# Patient Record
Sex: Female | Born: 1995 | Race: Black or African American | Hispanic: No | Marital: Single | Smoking: Never smoker
Health system: Southern US, Community
[De-identification: ages and names within clinical notes are randomized; demographics above are authoritative.]

## PROBLEM LIST (undated history)

## (undated) DIAGNOSIS — J45909 Unspecified asthma, uncomplicated: Secondary | ICD-10-CM

## (undated) DIAGNOSIS — T7840XA Allergy, unspecified, initial encounter: Secondary | ICD-10-CM

## (undated) DIAGNOSIS — F32A Depression, unspecified: Secondary | ICD-10-CM

## (undated) DIAGNOSIS — D689 Coagulation defect, unspecified: Secondary | ICD-10-CM

## (undated) DIAGNOSIS — F419 Anxiety disorder, unspecified: Secondary | ICD-10-CM

## (undated) DIAGNOSIS — A749 Chlamydial infection, unspecified: Secondary | ICD-10-CM

## (undated) DIAGNOSIS — M810 Age-related osteoporosis without current pathological fracture: Secondary | ICD-10-CM

## (undated) DIAGNOSIS — A599 Trichomoniasis, unspecified: Secondary | ICD-10-CM

## (undated) DIAGNOSIS — A549 Gonococcal infection, unspecified: Secondary | ICD-10-CM

## (undated) HISTORY — PX: ROOT CANAL: SHX2363

---
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
## 2019-01-16 ENCOUNTER — Encounter (HOSPITAL_COMMUNITY): Payer: Self-pay

## 2019-01-16 ENCOUNTER — Ambulatory Visit (HOSPITAL_COMMUNITY)
Admission: EM | Admit: 2019-01-16 | Discharge: 2019-01-16 | Disposition: A | Discharge status: Home / Self Care | Payer: Medicaid Other | Attending: Internal Medicine | Admitting: Internal Medicine

## 2019-01-16 ENCOUNTER — Other Ambulatory Visit: Payer: Self-pay

## 2019-01-16 DIAGNOSIS — N760 Acute vaginitis: Secondary | ICD-10-CM

## 2019-01-16 LAB — POCT PREGNANCY, URINE: Preg Test, Ur: NEGATIVE

## 2019-01-16 MED ORDER — AZITHROMYCIN 250 MG PO TABS
ORAL_TABLET | ORAL | Status: AC
  Filled 2019-01-16: qty 4

## 2019-01-16 MED ORDER — AZITHROMYCIN 250 MG PO TABS
1000.0000 mg | ORAL_TABLET | Freq: Once | ORAL | Status: AC
  Administered 2019-01-16: 1000 mg via ORAL

## 2019-01-16 NOTE — ED Provider Notes (Signed)
[facility]    CSN: [id number] Arrival date & time: 01/16/19  1612     History   Chief Complaint Chief Complaint  Patient presents with  . SEXUALLY TRANSMITTED DISEASE    HPI Cassie Nguyen is a 23 y.o. female. She presents today after a female sexual partner let her know that she was positive for chlamydia.  Little bit of vag discharge, no bleeding.  Unsure if she could be pregnant.  No pelvic/abd pain.  No dysuria, no change in frequency.  No change in bowel habits.  No fever.  No malaise.    HPI  History reviewed. No pertinent past medical history.  Patient Active Problem List   Diagnosis Date Noted  . 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 No pertinent surgical history.     Home Medications    Takes no meds regularly  Family History Family History  Problem Relation Age of Onset  . Cancer Mother     Social History Social History   Tobacco Use  . Smoking status: Never Smoker  . Smokeless tobacco: Never Used  Substance Use Topics  . Alcohol use: No  . Drug use: No     Allergies   Lactase and Wheat   Review of Systems Review of Systems  All other systems reviewed and are negative.    Physical Exam Triage Vital Signs ED Triage Vitals  Enc Vitals Group     BP 01/16/19 1752 121/86     Pulse Rate 01/16/19 1752 85     Resp 01/16/19 1752 17     Temp 01/16/19 1752 99.2 F (37.3 C)     Temp Source 01/16/19 1752 Oral     SpO2 01/16/19 1752 100 %     Weight --      Height --      Pain Score 01/16/19 1753 0     Pain Loc --    Updated Vital Signs BP 121/86 (BP Location: Left Arm)   Pulse 85   Temp 99.2 F (37.3 C) (Oral)   Resp 17   LMP 01/06/2019 (Approximate)   SpO2 100%  Physical Exam Vitals signs and nursing note reviewed.  Constitutional:      General: She is not in acute distress. HENT:     Head: Atraumatic.  Eyes:     Comments: Conjugate gaze observed, no eye  redness/discharge  Neck:     Musculoskeletal: Neck supple.  Cardiovascular:     Rate and Rhythm: Normal rate.  Pulmonary:     Effort: No respiratory distress.  Abdominal:     General: There is no distension.  Musculoskeletal: Normal range of motion.  Skin:    General: Skin is warm and dry.  Neurological:     Mental Status: She is alert and oriented to person, place, and time.      UC Treatments / Results  Labs Results for orders placed or performed during the hospital encounter of 01/16/19  Pregnancy, urine POC  Result Value Ref Range   Preg Test, Ur NEGATIVE NEGATIVE    Procedures Procedures (including critical care time)  Medications Ordered in UC Medications  azithromycin (ZITHROMAX) tablet 1,000 mg (1,000 mg Oral Given 01/16/19 1902)    Final Clinical Impressions(s) / UC Diagnoses   Final diagnoses:  Vaginitis and vulvovaginitis     Discharge Instructions     Vaginal swab testing is pending today.  Injection of rocephin and oral dose of zithromax (antibiotics)  were given at urgent care today.  The urgent care will contact you if additional treatment is needed, based on results.  Please refrain from sexual intercourse for 7 days to give the medicine time to work.  Condoms may reduce risk of reinfection.          Isa Rankin, MD 01/17/19 1346

## 2019-01-16 NOTE — Discharge Instructions (Addendum)
Vaginal swab testing is pending today.  Oral dose of zithromax (antibiotic) was given at urgent care today.  The urgent care will contact you if additional treatment is needed, based on results.  Please refrain from sexual intercourse for 7 days to give the medicine time to work.  Condoms may reduce risk of reinfection.

## 2019-01-16 NOTE — ED Triage Notes (Signed)
Pt presents to [facility] for STD testing, pt states she has been exposed to chlamydia partner tested positive today.

## 2019-01-19 LAB — CERVICOVAGINAL ANCILLARY ONLY
Bacterial vaginitis: NEGATIVE
CHLAMYDIA, DNA PROBE: NEGATIVE
Candida vaginitis: NEGATIVE
NEISSERIA GONORRHEA: NEGATIVE
TRICH (WINDOWPATH): NEGATIVE

## 2019-01-21 ENCOUNTER — Telehealth (HOSPITAL_COMMUNITY): Payer: Self-pay

## 2019-04-29 ENCOUNTER — Encounter (HOSPITAL_COMMUNITY): Payer: Self-pay

## 2019-04-29 ENCOUNTER — Other Ambulatory Visit: Payer: Self-pay

## 2019-04-29 ENCOUNTER — Ambulatory Visit (HOSPITAL_COMMUNITY)
Admission: EM | Admit: 2019-04-29 | Discharge: 2019-04-29 | Disposition: A | Discharge status: Home / Self Care | Payer: Medicaid Other | Attending: Family Medicine | Admitting: Family Medicine

## 2019-04-29 DIAGNOSIS — J020 Streptococcal pharyngitis: Secondary | ICD-10-CM | POA: Diagnosis not present

## 2019-04-29 LAB — POCT RAPID STREP A: Streptococcus, Group A Screen (Direct): POSITIVE — AB

## 2019-04-29 MED ORDER — AMOXICILLIN 500 MG PO CAPS: 500.0000 mg | ORAL_CAPSULE | Freq: Two times a day (BID) | ORAL | 0 refills | Status: DC

## 2019-04-29 NOTE — ED Provider Notes (Signed)
[facility]    CSN: [id number] Arrival date & time: 04/29/19  1638     History   Chief Complaint Chief Complaint  Patient presents with  . Rash    HPI Cassie Nguyen is a 23 y.o. female.   23 year old female comes in for 4 day history right lymphadenopathy, 1 day history of bottom lip rash. Had sore throat prior to current symptom onset but has since resolved. Denies rhinorrhea, nasal congestion, cough. Denies fever, chills, night sweats. Denies body aches. Denies pain, itching, burning to the lower lip. Denies new hygiene product/food. Has not taken anything for the symptoms. No obvious sick contact.      History reviewed. No pertinent past medical history.  Patient Active Problem List   Diagnosis Date Noted  . 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 No pertinent surgical history.  OB History   No obstetric history on file.      Home Medications    Prior to Admission medications   Medication Sig Start Date End Date Taking? Authorizing Provider  amoxicillin (AMOXIL) 500 MG capsule Take 1 capsule (500 mg total) by mouth 2 (two) times daily. 04/29/19   Belinda FisherYu, Amy V, PA-C    Family History Family History  Problem Relation Age of Onset  . Cancer Mother     Social History Social History   Tobacco Use  . Smoking status: Never Smoker  . Smokeless tobacco: Never Used  Substance Use Topics  . Alcohol use: No  . Drug use: No     Allergies   Lactase and Wheat   Review of Systems Review of Systems  Reason unable to perform ROS: See HPI as above.     Physical Exam Triage Vital Signs ED Triage Vitals  Enc Vitals Group     BP 04/29/19 1657 116/73     Pulse Rate 04/29/19 1657 86     Resp 04/29/19 1657 16     Temp 04/29/19 1657 99.1 F (37.3 C)     Temp Source 04/29/19 1657 Oral     SpO2 04/29/19 1657 100 %     Weight 04/29/19 1655 130 lb (59 kg)     Height --      Head Circumference --       Peak Flow --      Pain Score 04/29/19 1655 8     Pain Loc --      Pain Edu? --      Excl. in GC? --    No data found.  Updated Vital Signs BP 116/73 (BP Location: Right Arm)   Pulse 86   Temp 99.1 F (37.3 C) (Oral)   Resp 16   Wt 130 lb (59 kg)   SpO2 100%   Physical Exam Constitutional:      General: She is not in acute distress.    Appearance: Normal appearance. She is not ill-appearing, toxic-appearing or diaphoretic.  HENT:     Head: Normocephalic and atraumatic.     Mouth/Throat:     Mouth: Mucous membranes are moist.     Pharynx: Oropharynx is clear. Uvula midline.     Tonsils: Tonsillar exudate present. 2+ on the right. 2+ on the left.  Neck:     Musculoskeletal: Normal range of motion and neck supple.  Cardiovascular:     Rate and Rhythm: Normal rate and regular rhythm.     Heart sounds: Normal heart sounds. No murmur. No friction  rub. No gallop.   Pulmonary:     Effort: Pulmonary effort is normal. No accessory muscle usage, prolonged expiration, respiratory distress or retractions.     Comments: Lungs clear to auscultation without adventitious lung sounds. Lymphadenopathy:     Head:     Right side of head: Submandibular and tonsillar adenopathy present.     Left side of head: Submandibular and tonsillar adenopathy present.  Neurological:     General: No focal deficit present.     Mental Status: She is alert and oriented to person, place, and time.    UC Treatments / Results  Labs (all labs ordered are listed, but only abnormal results are displayed) Labs Reviewed  POCT RAPID STREP A - Abnormal; Notable for the following components:      Result Value   Streptococcus, Group A Screen (Direct) POSITIVE (*)    All other components within normal limits    EKG None  Radiology No results found.  Procedures Procedures (including critical care time)  Medications Ordered in UC Medications - No data to display  Initial Impression / Assessment and  Plan / UC Course  I have reviewed the triage vital signs and the nursing notes.  Pertinent labs & imaging results that were available during my care of the patient were reviewed by me and considered in my medical decision making (see chart for details).    Rapid strep positive. Start antibiotic as directed. Symptomatic treatment as needed. Return precautions given.   Final Clinical Impressions(s) / UC Diagnoses   Final diagnoses:  Streptococcal sore throat   ED Prescriptions    Medication Sig Dispense Auth. Provider   amoxicillin (AMOXIL) 500 MG capsule Take 1 capsule (500 mg total) by mouth 2 (two) times daily. 20 capsule Threasa Alpha, [place] 04/29/19 [phone]

## 2019-04-29 NOTE — ED Triage Notes (Signed)
Pt states she has a rash on her bottom lip x 1day. Pt states she has swollen gland on the right side of her neck 4 days.

## 2019-04-29 NOTE — Discharge Instructions (Signed)
Rapid strep positive. Start amoxicillin as directed. Tylenol/Motrin for fever and pain. Monitor for any worsening of symptoms, trouble breathing, trouble swallowing, swelling of the throat, leaning forward to breath, drooling, follow up here or at the emergency department for reevaluation. ° °

## 2019-05-02 ENCOUNTER — Ambulatory Visit (HOSPITAL_COMMUNITY)
Admission: EM | Admit: 2019-05-02 | Discharge: 2019-05-02 | Disposition: A | Discharge status: Home / Self Care | Payer: Medicaid Other | Attending: Emergency Medicine | Admitting: Emergency Medicine

## 2019-05-02 ENCOUNTER — Encounter (HOSPITAL_COMMUNITY): Payer: Self-pay | Admitting: Emergency Medicine

## 2019-05-02 ENCOUNTER — Other Ambulatory Visit: Payer: Self-pay

## 2019-05-02 DIAGNOSIS — N760 Acute vaginitis: Secondary | ICD-10-CM | POA: Insufficient documentation

## 2019-05-02 MED ORDER — FLUCONAZOLE 150 MG PO TABS: ORAL_TABLET | ORAL | 0 refills | Status: DC

## 2019-05-02 NOTE — Discharge Instructions (Signed)
I am concerned about yeast contributing to your skin rash.  Please take the two pills as prescribed, may only require 1.  If still with mild symptoms may use over the counter Monistat cream topically as well.  Promptly change out of any moist undergarments.  Will notify you of any positive findings from your vaginal swab and if any changes to treatment are needed.

## 2019-05-02 NOTE — ED Triage Notes (Signed)
Says her vagina is really dry. Says some discharge but has gone away. Say there is some area on her clitoris is dry and feel different.

## 2019-05-03 NOTE — ED Provider Notes (Signed)
[facility]    CSN: [id number] Arrival date & time: 05/02/19  1657     History   Chief Complaint Chief Complaint  Patient presents with  . Exposure to STD    HPI Cassie Nguyen is a 23 y.o. female.   Cassie Nguyen presents with "dryness" to vulva which she has noticed for the past week. She notices dry skin and almost like sloughing of skin. Feels like she may have had some vaginal discharge at initial onset but denies any currently. No vaginal bleeding. No pain. No urinary symptoms. Denies  Any previous similar. She is currently on antibiotics for strep throat, states her symptoms started prior to these, however. Sexually active with female partner. No specific known std exposure. Without contributing medical history.      ROS per HPI, negative if not otherwise mentioned.      History reviewed. No pertinent past medical history.  Patient Active Problem List   Diagnosis Date Noted  . 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 No pertinent surgical history.  OB History   No obstetric history on file.      Home Medications    Prior to Admission medications   Medication Sig Start Date End Date Taking? Authorizing Provider  amoxicillin (AMOXIL) 500 MG capsule Take 1 capsule (500 mg total) by mouth 2 (two) times daily. 04/29/19   Cathie Hoops, Amy V, PA-C  fluconazole (DIFLUCAN) 150 MG tablet Take 1 tablet today. If still with symptoms may repeat in 3 days. 05/02/19   Georgetta Haber, NP    Family History Family History  Problem Relation Age of Onset  . Cancer Mother     Social History Social History   Tobacco Use  . Smoking status: Never Smoker  . Smokeless tobacco: Never Used  Substance Use Topics  . Alcohol use: No  . Drug use: No     Allergies   Lactase and Wheat   Review of Systems Review of Systems   Physical Exam Triage Vital Signs ED Triage Vitals  Enc Vitals Group     BP 05/02/19  1725 117/83     Pulse Rate 05/02/19 1725 88     Resp 05/02/19 1725 16     Temp 05/02/19 1725 98.2 F (36.8 C)     Temp Source 05/02/19 1725 Oral     SpO2 05/02/19 1725 100 %     Weight --      Height --      Head Circumference --      Peak Flow --      Pain Score 05/02/19 1726 0     Pain Loc --      Pain Edu? --      Excl. in GC? --    No data found.  Updated Vital Signs BP 117/83 (BP Location: Right Arm)   Pulse 88   Temp 98.2 F (36.8 C) (Oral)   Resp 16   LMP 04/25/2019   SpO2 100%    Physical Exam Constitutional:      General: She is not in acute distress.    Appearance: She is well-developed.  Cardiovascular:     Rate and Rhythm: Normal rate and regular rhythm.     Heart sounds: Normal heart sounds.  Pulmonary:     Effort: Pulmonary effort is normal.     Breath sounds: Normal breath sounds.  Genitourinary:      Comments: White flaking edge rash noted surrounding  vulva and into pubic mons, no redness, non tender; clear egg white vaginal discharge noted; vaginal swab collected, deferred bimanual exam or internal exam; no pelvic pain or tenderness; no sores or lesions  Skin:    General: Skin is warm and dry.  Neurological:     Mental Status: She is alert and oriented to person, place, and time.      UC Treatments / Results  Labs (all labs ordered are listed, but only abnormal results are displayed) Labs Reviewed  CERVICOVAGINAL ANCILLARY ONLY    EKG None  Radiology No results found.  Procedures Procedures (including critical care time)  Medications Ordered in UC Medications - No data to display  Initial Impression / Assessment and Plan / UC Course  I have reviewed the triage vital signs and the nursing notes.  Pertinent labs & imaging results that were available during my care of the patient were reviewed by me and considered in my medical decision making (see chart for details).     Dry flaky rash with defined edges to vulva, extending  from vagina. Recent antibiotics. Yeast likely with diflucan provided. May use topical monistat if needed. Vaginal swab collected and pending. Will notify of any positive findings and if any changes to treatment are needed.  If symptoms worsen or do not improve in the next week to return to be seen or to follow up with PCP.  Patient verbalized understanding and agreeable to plan.   Final Clinical Impressions(s) / UC Diagnoses   Final diagnoses:  Vulvovaginitis     Discharge Instructions     I am concerned about yeast contributing to your skin rash.  Please take the two pills as prescribed, may only require 1.  If still with mild symptoms may use over the counter Monistat cream topically as well.  Promptly change out of any moist undergarments.  Will notify you of any positive findings from your vaginal swab and if any changes to treatment are needed.     ED Prescriptions    Medication Sig Dispense Auth. Provider   fluconazole (DIFLUCAN) 150 MG tablet Take 1 tablet today. If still with symptoms may repeat in 3 days. 2 tablet Georgetta HaberBurky,  B, NP     Controlled Substance Prescriptions [place] Controlled Substance Registry consulted? Not Applicable   Georgetta HaberBurky,  B, NP 05/03/19 1013

## 2019-05-05 LAB — CERVICOVAGINAL ANCILLARY ONLY
Bacterial vaginitis: NEGATIVE
Candida vaginitis: NEGATIVE
Chlamydia: NEGATIVE
Neisseria Gonorrhea: NEGATIVE
Trichomonas: NEGATIVE

## 2019-06-04 ENCOUNTER — Ambulatory Visit (HOSPITAL_COMMUNITY)
Admission: EM | Admit: 2019-06-04 | Discharge: 2019-06-04 | Disposition: A | Discharge status: Home / Self Care | Payer: Medicaid Other | Attending: Family Medicine | Admitting: Family Medicine

## 2019-06-04 ENCOUNTER — Encounter (HOSPITAL_COMMUNITY): Payer: Self-pay

## 2019-06-04 DIAGNOSIS — N939 Abnormal uterine and vaginal bleeding, unspecified: Secondary | ICD-10-CM | POA: Diagnosis not present

## 2019-06-04 LAB — POCT PREGNANCY, URINE: Preg Test, Ur: NEGATIVE

## 2019-06-04 LAB — POCT URINALYSIS DIP (DEVICE)
Bilirubin Urine: NEGATIVE
Glucose, UA: NEGATIVE mg/dL
Ketones, ur: NEGATIVE mg/dL
Leukocytes,Ua: NEGATIVE
Nitrite: NEGATIVE
Protein, ur: NEGATIVE mg/dL
Specific Gravity, Urine: >=1.03 (ref 1.005–1.030)
Urobilinogen, UA: 0.2 mg/dL (ref 0.0–1.0)
pH: 5.5 (ref 5.0–8.0)

## 2019-06-04 MED ORDER — MEGESTROL ACETATE 40 MG PO TABS: 40.0000 mg | ORAL_TABLET | Freq: Two times a day (BID) | ORAL | 0 refills | Status: DC

## 2019-06-04 NOTE — Discharge Instructions (Addendum)
I will give you some medication to stop the bleeding. If this problem continues you need to follow-up with an OB GYN for further evaluation and management.  I will put a contact on your discharge instructions

## 2019-06-04 NOTE — ED Triage Notes (Signed)
Pt c/o heavy vaginal bleeding x4 days. States last cycle 2wks ago

## 2019-06-04 NOTE — ED Provider Notes (Signed)
[facility]    CSN: [id number] Arrival date & time: 06/04/19  1112     History   Chief Complaint Chief Complaint  Patient presents with  . Vaginal Bleeding    HPI Cassie Nguyen is a 23 y.o. female.   Patient is a 23 year old female presents today with irregular vaginal bleeding.  This has been present for 4 days.  She has had heavy bleeding with clots more in the morning and evening.  Reporting that she just got off her menstrual cycle approximately 2 weeks ago.  Her menstrual cycles are typically regular.  She does not take any birth control.  She denies any abdominal pain, back pain, fevers, dysuria, hematuria. No vaginal discharge, itching or irritation.  Denies any dizziness, weakness or shortness of breath.  No history of anemia.   ROS per HPI      History reviewed. No pertinent past medical history.  Patient Active Problem List   Diagnosis Date Noted  . 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 No pertinent surgical history.  OB History   No obstetric history on file.      Home Medications    Prior to Admission medications   Medication Sig Start Date End Date Taking? Authorizing Provider  megestrol (MEGACE) 40 MG tablet Take 1 tablet (40 mg total) by mouth 2 (two) times daily. 06/04/19   Janace ArisBast, Keiandra Sullenger A, NP    Family History Family History  Problem Relation Age of Onset  . Cancer Mother     Social History Social History   Tobacco Use  . Smoking status: Never Smoker  . Smokeless tobacco: Never Used  Substance Use Topics  . Alcohol use: No  . Drug use: No     Allergies   Lactase and Wheat   Review of Systems Review of Systems   Physical Exam Triage Vital Signs ED Triage Vitals  Enc Vitals Group     BP 06/04/19 1123 110/71     Pulse Rate 06/04/19 1123 90     Resp 06/04/19 1123 18     Temp 06/04/19 1123 98.5 F (36.9 C)     Temp Source 06/04/19 1123 Oral     SpO2 06/04/19  1123 100 %     Weight --      Height --      Head Circumference --      Peak Flow --      Pain Score 06/04/19 1125 0     Pain Loc --      Pain Edu? --      Excl. in GC? --    No data found.  Updated Vital Signs BP 110/71 (BP Location: Left Arm)   Pulse 90   Temp 98.5 F (36.9 C) (Oral)   Resp 18   LMP 05/31/2019   SpO2 100%   Visual Acuity Right Eye Distance:   Left Eye Distance:   Bilateral Distance:    Right Eye Near:   Left Eye Near:    Bilateral Near:     Physical Exam Vitals signs and nursing note reviewed.  Constitutional:      General: She is not in acute distress.    Appearance: Normal appearance. She is not ill-appearing, toxic-appearing or diaphoretic.  HENT:     Head: Normocephalic and atraumatic.     Nose: Nose normal.  Eyes:     Conjunctiva/sclera: Conjunctivae normal.  Neck:     Musculoskeletal: Normal range of motion.  Pulmonary:     Effort: Pulmonary effort is normal.  Abdominal:     Palpations: Abdomen is soft.     Tenderness: There is no abdominal tenderness.  Genitourinary:    Comments: External vaginal area without lesions.  Moderate amount of blood near vaginal opening.  Attempted to do an internal speculum exam but patient was unable to tolerate. Exam stopped Musculoskeletal: Normal range of motion.  Skin:    General: Skin is warm and dry.  Neurological:     Mental Status: She is alert.  Psychiatric:        Mood and Affect: Mood normal.       UC Treatments / Results  Labs (all labs ordered are listed, but only abnormal results are displayed) Labs Reviewed  POCT URINALYSIS DIP (DEVICE) - Abnormal; Notable for the following components:      Result Value   Hgb urine dipstick LARGE (*)    All other components within normal limits  POC URINE PREG, ED  POCT PREGNANCY, URINE    EKG None  Radiology No results found.  Procedures Procedures (including critical care time)  Medications Ordered in UC Medications - No data to  display  Initial Impression / Assessment and Plan / UC Course  I have reviewed the triage vital signs and the nursing notes.  Pertinent labs & imaging results that were available during my care of the patient were reviewed by me and considered in my medical decision making (see chart for details).     Urine negative for pregnancy or infection. Patient having abnormal uterine bleeding. This is most likely hormonal That really concern at this time for infection, STDs She has been bleeding heavy for 4 days.  We will have her do Megace and follow-up with OB/GYN  Final Clinical Impressions(s) / UC Diagnoses   Final diagnoses:  Abnormal uterine bleeding     Discharge Instructions     I will give you some medication to stop the bleeding. If this problem continues you need to follow-up with an OB GYN for further evaluation and management.  I will put a contact on your discharge instructions    ED Prescriptions    Medication Sig Dispense Auth. Provider   megestrol (MEGACE) 40 MG tablet Take 1 tablet (40 mg total) by mouth 2 (two) times daily. 20 tablet Dahlia Byes A, NP     Controlled Substance Prescriptions [place] Controlled Substance Registry consulted? Not Applicable   Janace Aris, NP 06/04/19 1341

## 2019-06-25 ENCOUNTER — Ambulatory Visit (HOSPITAL_COMMUNITY)
Admission: EM | Admit: 2019-06-25 | Discharge: 2019-06-25 | Disposition: A | Discharge status: Home / Self Care | Payer: Medicaid Other | Attending: Internal Medicine | Admitting: Internal Medicine

## 2019-06-25 ENCOUNTER — Other Ambulatory Visit: Payer: Self-pay

## 2019-06-25 ENCOUNTER — Encounter (HOSPITAL_COMMUNITY): Payer: Self-pay

## 2019-06-25 DIAGNOSIS — N921 Excessive and frequent menstruation with irregular cycle: Secondary | ICD-10-CM | POA: Diagnosis not present

## 2019-06-25 LAB — CBC
HCT: 35.6 % — ABNORMAL LOW (ref 36.0–46.0)
Hemoglobin: 12.1 g/dL (ref 12.0–15.0)
MCH: 29.2 pg (ref 26.0–34.0)
MCHC: 34 g/dL (ref 30.0–36.0)
MCV: 85.8 fL (ref 80.0–100.0)
Platelets: 377 10*3/uL (ref 150–400)
RBC: 4.15 MIL/uL (ref 3.87–5.11)
RDW: 13.6 % (ref 11.5–15.5)
WBC: 3.7 10*3/uL — ABNORMAL LOW (ref 4.0–10.5)
nRBC: 0 % (ref 0.0–0.2)

## 2019-06-25 LAB — TSH: TSH: 0.752 u[IU]/mL (ref 0.350–4.500)

## 2019-06-25 MED ORDER — MEDROXYPROGESTERONE ACETATE 10 MG PO TABS: 10.0000 mg | ORAL_TABLET | Freq: Every day | ORAL | 0 refills | Status: DC

## 2019-06-25 NOTE — ED Provider Notes (Signed)
[facility]    CSN: [id number] Arrival date & time: 06/25/19  1438      History   Chief Complaint Chief Complaint  Patient presents with  . Appointment    2:30  . Vaginal Bleeding    HPI Cassie Nguyen is a 23 y.o. female with a history of prolonged menstrual.  Comes to urgent care with complaints of persistent vaginal bleeding.  Patient was seen here on 06/04/2019.  She was prescribed Megace and advised to follow-up with a gynecologist.  Patient has not been able to see a gynecologist yet.  She uses about 6 tampons a day.  She admits to having some cramping in the lower abdomen.  No dizziness, near syncope or syncopal episodes.  HPI  History reviewed. No pertinent past medical history.  Patient Active Problem List   Diagnosis Date Noted  . 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 No pertinent surgical history.  OB History   No obstetric history on file.      Home Medications    Prior to Admission medications   Medication Sig Start Date End Date Taking? Authorizing Provider  megestrol (MEGACE) 40 MG tablet Take 1 tablet (40 mg total) by mouth 2 (two) times daily. 06/04/19  Yes Janace ArisBast, Traci A, NP    Family History Family History  Problem Relation Age of Onset  . Cancer Mother     Social History Social History   Tobacco Use  . Smoking status: Never Smoker  . Smokeless tobacco: Never Used  Substance Use Topics  . Alcohol use: No  . Drug use: No     Allergies   Lactase and Wheat   Review of Systems Review of Systems  Constitutional: Negative for activity change, appetite change and fatigue.  HENT: Negative.   Cardiovascular: Negative.   Gastrointestinal: Positive for abdominal pain.  Genitourinary: Positive for vaginal bleeding. Negative for dysuria, frequency, vaginal discharge and vaginal pain.  Musculoskeletal: Negative.      Physical Exam Triage Vital Signs ED Triage Vitals  Enc  Vitals Group     BP 06/25/19 1456 (!) 110/57     Pulse Rate 06/25/19 1456 84     Resp 06/25/19 1456 18     Temp 06/25/19 1456 98.6 F (37 C)     Temp Source 06/25/19 1456 Oral     SpO2 06/25/19 1456 100 %     Weight --      Height --      Head Circumference --      Peak Flow --      Pain Score 06/25/19 1453 0     Pain Loc --      Pain Edu? --      Excl. in GC? --    No data found.  Updated Vital Signs BP (!) 110/57 (BP Location: Right Arm)   Pulse 84   Temp 98.6 F (37 C) (Oral)   Resp 18   LMP 05/31/2019   SpO2 100%   Visual Acuity Right Eye Distance:   Left Eye Distance:   Bilateral Distance:    Right Eye Near:   Left Eye Near:    Bilateral Near:     Physical Exam Vitals signs and nursing note reviewed.  Constitutional:      General: She is not in acute distress.    Appearance: Normal appearance. She is not ill-appearing or toxic-appearing.  HENT:     Right Ear: Tympanic membrane normal.  Left Ear: Tympanic membrane normal.  Cardiovascular:     Rate and Rhythm: Normal rate and regular rhythm.     Pulses: Normal pulses.     Heart sounds: Normal heart sounds.  Pulmonary:     Effort: Pulmonary effort is normal. No respiratory distress.     Breath sounds: Normal breath sounds. No wheezing or rales.  Abdominal:     General: Bowel sounds are normal.     Palpations: Abdomen is soft.  Musculoskeletal: Normal range of motion.  Skin:    General: Skin is warm.     Capillary Refill: Capillary refill takes less than 2 seconds.  Neurological:     Mental Status: She is alert.      UC Treatments / Results  Labs (all labs ordered are listed, but only abnormal results are displayed) Labs Reviewed - No data to display  EKG None  Radiology No results found.  Procedures Procedures (including critical care time)  Medications Ordered in UC Medications - No data to display  Initial Impression / Assessment and Plan / UC Course  I have reviewed the triage  vital signs and the nursing notes.  Pertinent labs & imaging results that were available during my care of the patient were reviewed by me and considered in my medical decision making (see chart for details).     1. Metromenorrhagia: Provera 10 mg orally daily for 10 days Patient is strongly encouraged to find a gynecologist for evaluation CBC, TSH Patient has no signs of anemia.  She is hemodynamically stable.  Final Clinical Impressions(s) / UC Diagnoses   Final diagnoses:  None   Discharge Instructions   None    ED Prescriptions    None     Controlled Substance Prescriptions [place] Controlled Substance Registry consulted? No   Chase Picket, MD 06/25/19 [phone]

## 2019-06-25 NOTE — ED Triage Notes (Signed)
Patient presents to Urgent Care with complaints of continued vaginal bleeding since the beginning of the month. Patient reports she was seen at the beginning of the month for the same and was given a medication to help stop the bleeding, but that would only help for a few hours and the bleeding would restart. Pt denies fatigue.

## 2019-06-26 ENCOUNTER — Telehealth (HOSPITAL_COMMUNITY): Payer: Self-pay | Admitting: Emergency Medicine

## 2019-06-26 NOTE — Telephone Encounter (Signed)
Normal labs per Dr. Lanny Cramp, Patient contacted and made aware of all results, all questions answered. Pt has OBGYN follow up 07/20/2019

## 2019-07-20 ENCOUNTER — Other Ambulatory Visit: Payer: Self-pay

## 2019-07-20 ENCOUNTER — Ambulatory Visit (INDEPENDENT_AMBULATORY_CARE_PROVIDER_SITE_OTHER): Payer: Medicaid Other | Admitting: Obstetrics and Gynecology

## 2019-07-20 ENCOUNTER — Other Ambulatory Visit (HOSPITAL_COMMUNITY)
Admission: RE | Admit: 2019-07-20 | Discharge: 2019-07-20 | Disposition: A | Discharge status: Home / Self Care | Payer: Medicaid Other | Source: Ambulatory Visit | Attending: Obstetrics and Gynecology | Admitting: Obstetrics and Gynecology

## 2019-07-20 VITALS — BP 120/80 | Temp 98.1°F | Wt 123.6 lb

## 2019-07-20 DIAGNOSIS — N939 Abnormal uterine and vaginal bleeding, unspecified: Secondary | ICD-10-CM

## 2019-07-20 NOTE — Progress Notes (Signed)
Obstetrics and Gynecology Annual Patient Evaluation  Appointment Date: 07/20/2019  OBGYN Clinic: [facility]  Primary Care Provider: Patient, No Pcp Per  Referring Provider: Self  Chief Complaint:  Chief Complaint  Patient presents with  . Menstrual Problem    History of Present Illness: Cassie Nguyen is a 23 y.o. African-American P0 (No LMP recorded (lmp unknown).), seen for the above chief complaint.   Patient has long standing of irregular periods but have gotten worse in the past few months. She's gone to UC twice in June for this issue. She states she took the provera once but stopped after that. Bleeding recently got worse after same sex intercourse. She has never been on anything, aside from the one time provera dose, to help with her bleeding or for birth control.   No vaginal itching, abdominal pain, current bleeding, vaginal discharge. She denies any h/o migraines, HAs, VTEs  Review of Systems: as noted in the History of Present Illness.  Past Medical History:  No past medical history on file.  Past Surgical History:  No past surgical history on file.  Past Obstetrical History:  OB History  Gravida Para Term Preterm AB Living  0 0 0 0 0 0  SAB TAB Ectopic Multiple Live Births  0 0 0 0 0    Past Gynecological History: As per HPI. Menarche age 47 y/o History of Pap Smear(s): No. History of STI(s): No. She is currently using no method for contraception.   Social History:  Social History   Socioeconomic History  . Marital status: Single    Spouse name: Not on file  . Number of children: Not on file  . Years of education: Not on file  . Highest education level: Not on file  Occupational History  . Not on file  Social Needs  . Financial resource strain: Not on file  . Food insecurity    Worry: Not on file    Inability: Not on file  . Transportation needs    Medical: Not on file    Non-medical: Not on file  Tobacco Use  .  Smoking status: Never Smoker  . Smokeless tobacco: Never Used  Substance and Sexual Activity  . Alcohol use: No  . Drug use: No  . Sexual activity: Yes  Lifestyle  . Physical activity    Days per week: Not on file    Minutes per session: Not on file  . Stress: Not on file  Relationships  . Social Herbalist on phone: Not on file    Gets together: Not on file    Attends religious service: Not on file    Active member of club or organization: Not on file    Attends meetings of clubs or organizations: Not on file    Relationship status: Not on file  . Intimate partner violence    Fear of current or ex partner: Not on file    Emotionally abused: Not on file    Physically abused: Not on file    Forced sexual activity: Not on file  Other Topics Concern  . Not on file  Social History Narrative  . Not on file    Family History:  None   Medications Cassie Nguyen had no medications administered during this visit. Current Outpatient Medications  Medication Sig Dispense Refill  . medroxyPROGESTERone (PROVERA) 10 MG tablet Take 1 tablet (10 mg total) by mouth daily. (Patient not taking: Reported on 07/20/2019) 10 tablet 0  No current facility-administered medications for this visit.     Allergies Lactase and Wheat   Physical Exam:  BP 120/80   Temp 98.1 F (36.7 C)   Wt 123 lb 9.6 oz (56.1 kg)   LMP  (LMP Unknown) Comment: Approximately in May and lasting two months There is no height or weight on file to calculate BMI. General appearance: Well nourished, well developed female in no acute distress.  Neck:  Supple, normal appearance, and no thyromegaly  Cardiovascular: normal s1 and s2.  No murmurs, rubs or gallops. Respiratory:  Clear to auscultation bilateral. Normal respiratory effort Abdomen: positive bowel sounds and no masses, hernias; diffusely non tender to palpation, non distended Neuro/Psych:  Normal mood and affect.  Skin:  Warm and dry.   Lymphatic:  No inguinal lymphadenopathy.   Pelvic exam: is not limited by body habitus EGBUS: within normal limits, Vagina: within normal limits and with no blood or discharge in the vault, Cervix: normal appearing cervix without tenderness, discharge or lesions. Uterus:  Mobile, nonenlarged and non tender and Adnexa:  normal adnexa and no mass, fullness, tenderness Rectovaginal: deferred  Laboratory: UPT negative. UC TSH and CBC neg  Radiology: none  Assessment: pt stable  Plan:  1. 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 I told her I'd recommend some type of medical therapy (OCPs, patch, ring, depo provera, LNG IUD in that order) to help regulate her cycles and if that doesn't work then do an u/s and extensive work up. Pt to consider options  - Cervicovaginal ancillary only - Cytology - PAP  RTC PRN  Cassie Nguyen, Jr MD Attending [facility](Faculty Practice)

## 2019-07-21 ENCOUNTER — Encounter: Payer: Self-pay | Admitting: Obstetrics and Gynecology

## 2019-07-22 DIAGNOSIS — H52223 Regular astigmatism, bilateral: Secondary | ICD-10-CM | POA: Diagnosis not present

## 2019-07-22 LAB — CYTOLOGY - PAP

## 2019-07-23 LAB — CERVICOVAGINAL ANCILLARY ONLY
Bacterial vaginitis: POSITIVE — AB
Candida vaginitis: NEGATIVE
Chlamydia: NEGATIVE
Neisseria Gonorrhea: NEGATIVE
Trichomonas: NEGATIVE

## 2019-07-27 ENCOUNTER — Telehealth: Payer: Self-pay | Admitting: Obstetrics and Gynecology

## 2019-07-27 DIAGNOSIS — N879 Dysplasia of cervix uteri, unspecified: Secondary | ICD-10-CM | POA: Insufficient documentation

## 2019-07-27 MED ORDER — METRONIDAZOLE 500 MG PO TABS: 500.0000 mg | ORAL_TABLET | Freq: Two times a day (BID) | ORAL | 0 refills | Status: DC

## 2019-07-27 NOTE — Telephone Encounter (Signed)
GYN telephone note  Patient called at [phone] and generic VM picked up. VM left for her to call the clinic for her lab results.   Please inform patient she will need to go by pharmacy to pick up flagyl for her BV and she will need a colposcopy for her abnormal pap smear.   Durene Romans MD Attending Center for [facility] (Faculty Practice) 07/27/2019 Time: [phone]

## 2019-08-06 ENCOUNTER — Telehealth: Payer: Self-pay

## 2019-08-06 DIAGNOSIS — N879 Dysplasia of cervix uteri, unspecified: Secondary | ICD-10-CM

## 2019-08-06 NOTE — Telephone Encounter (Addendum)
-----   Message from Aletha Halim, MD sent at 08/05/2019  2:13 PM EDT ----- Please try and call her and let her know that she needs a colposcopy. If she doesn't answer for you, too, please send her a certified letter. Thank you  Pt notified of abnormal pap smear results.  I informed pt that the front office will contact her to schedule an appt with Dr. Ilda Basset for colposcopy.

## 2019-08-12 ENCOUNTER — Telehealth: Payer: Self-pay | Admitting: Obstetrics and Gynecology

## 2019-08-12 NOTE — Telephone Encounter (Signed)
Patient called in stating that someone had called her last week with her results. Patient wants to get her results again and wants to know what a colposcopy in tells.

## 2019-08-13 NOTE — Telephone Encounter (Signed)
I called Cassie Nguyen and she asked me to explain again what a pap smear is and what a colposcopy is. I explained what a pap smear is , what her pap results were  and that a colposcopy is recommended. I explained what a colposcopy is . She voices understanding. She also states that she is still having bleeding issues. I explained that when she saw Dr. Ilda Basset and they discussed this that he gave her options to consider  And that he recommended medical theraphy. She states she does not remember what her choices are. I reviewed what he recommended and informed her she can discuss at her colposcopy appointment. She voices understanding.

## 2019-09-14 ENCOUNTER — Ambulatory Visit: Payer: Medicaid Other | Admitting: Obstetrics and Gynecology

## 2019-10-02 ENCOUNTER — Ambulatory Visit: Payer: Medicaid Other | Admitting: Obstetrics and Gynecology

## 2019-10-02 ENCOUNTER — Encounter: Payer: Self-pay | Admitting: Obstetrics and Gynecology

## 2019-10-06 ENCOUNTER — Telehealth: Payer: Self-pay | Admitting: Obstetrics and Gynecology

## 2019-10-06 ENCOUNTER — Encounter: Payer: Self-pay | Admitting: Obstetrics and Gynecology

## 2019-10-06 NOTE — Progress Notes (Signed)
GYN Note Patient no show'ed to colposcopy appointment. Will have front desk call and reschedule; if no luck, then will have them send certified letter.  Durene Romans MD Attending Center for [facility] [employer] farm manager)

## 2019-10-06 NOTE — Telephone Encounter (Signed)
Attempted to call patient to get her rescheduled for her colpo. No answer, left voicemail for patient to give the office a call back. Certified letter mailed.

## 2019-11-06 ENCOUNTER — Encounter (HOSPITAL_COMMUNITY): Payer: Self-pay

## 2019-11-06 ENCOUNTER — Other Ambulatory Visit: Payer: Self-pay

## 2019-11-06 ENCOUNTER — Ambulatory Visit (HOSPITAL_COMMUNITY)
Admission: EM | Admit: 2019-11-06 | Discharge: 2019-11-06 | Disposition: A | Discharge status: Home / Self Care | Payer: Medicaid Other | Attending: Emergency Medicine | Admitting: Emergency Medicine

## 2019-11-06 DIAGNOSIS — M25531 Pain in right wrist: Secondary | ICD-10-CM | POA: Diagnosis not present

## 2019-11-06 NOTE — Discharge Instructions (Signed)
Take Tylenol or ibuprofen as needed for your pain.    Rest and elevate your wrist.  Apply ice packs 2-3 times a day for up to 20 minutes each.  Wear the wrist splint as needed for comfort.    Follow up with your primary care provider or an orthopedist if you symptoms continue or worsen;  Or if you develop new symptoms, such as numbness, tingling, or weakness.

## 2019-11-06 NOTE — ED Triage Notes (Signed)
Patient presents to Urgent Care with complaints of right wrist pain since sometime last week. 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

## 2019-11-06 NOTE — ED Provider Notes (Signed)
[facility]    CSN: [id number] Arrival date & time: 11/06/19  3299      History   Chief Complaint Chief Complaint  Patient presents with  . Appointment    8:50  . Wrist Pain    HPI Cassie Nguyen is a 23 y.o. female.   Patient presents with pain in her right wrist x1 week.  No specific injury or fall.  She performs repetitive motion using her wrist at work and believes this is the cause.  She denies numbness, weakness, tingling, swelling, lesions, rash, fever, or other symptoms.  No treatments attempted at home.  No other joint pains.  The history is provided by the patient.    History reviewed. No pertinent past medical history.  Patient Active Problem List   Diagnosis Date Noted  . Cervical dysplasia 07/27/2019  . Abnormal uterine bleeding (AUB) 07/20/2019  . 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 No pertinent surgical history.  OB History    Gravida  0   Para  0   Term  0   Preterm  0   AB  0   Living  0     SAB  0   TAB  0   Ectopic  0   Multiple  0   Live Births  0            Home Medications    Prior to Admission medications   Medication Sig Start Date End Date Taking? Authorizing Provider  medroxyPROGESTERone (PROVERA) 10 MG tablet Take 1 tablet (10 mg total) by mouth daily. Patient not taking: Reported on 07/20/2019 06/25/19   Merrilee Jansky, MD  metroNIDAZOLE (FLAGYL) 500 MG tablet Take 1 tablet (500 mg total) by mouth 2 (two) times daily. 07/27/19   [place] Bing, MD    Family History Family History  Problem Relation Age of Onset  . Cancer Mother     Social History Social History   Tobacco Use  . Smoking status: Never Smoker  . Smokeless tobacco: Never Used  Substance Use Topics  . Alcohol use: No  . Drug use: No     Allergies   Lactase, Shrimp [shellfish allergy], and Wheat   Review of Systems Review of Systems  Constitutional: Negative for  chills and fever.  HENT: Negative for ear pain and sore throat.   Eyes: Negative for pain and visual disturbance.  Respiratory: Negative for cough and shortness of breath.   Cardiovascular: Negative for chest pain and palpitations.  Gastrointestinal: Negative for abdominal pain and vomiting.  Genitourinary: Negative for dysuria and hematuria.  Musculoskeletal: Positive for arthralgias. Negative for back pain.  Skin: Negative for color change and rash.  Neurological: Negative for seizures, syncope, weakness and numbness.  All other systems reviewed and are negative.    Physical Exam Triage Vital Signs ED Triage Vitals  Enc Vitals Group     BP      Pulse      Resp      Temp      Temp src      SpO2      Weight      Height      Head Circumference      Peak Flow      Pain Score      Pain Loc      Pain Edu?      Excl. in GC?    No data found.  Updated Vital  Signs BP 107/71 (BP Location: Right Arm)   Pulse 70   Temp 98.7 F (37.1 C) (Oral)   Resp 16   SpO2 100%   Visual Acuity Right Eye Distance:   Left Eye Distance:   Bilateral Distance:    Right Eye Near:   Left Eye Near:    Bilateral Near:     Physical Exam Vitals signs and nursing note reviewed.  Constitutional:      General: She is not in acute distress.    Appearance: She is well-developed.  HENT:     Head: Normocephalic and atraumatic.     Mouth/Throat:     Mouth: Mucous membranes are moist.     Pharynx: Oropharynx is clear.  Eyes:     Conjunctiva/sclera: Conjunctivae normal.  Neck:     Musculoskeletal: Neck supple.  Cardiovascular:     Rate and Rhythm: Normal rate and regular rhythm.     Heart sounds: No murmur.  Pulmonary:     Effort: Pulmonary effort is normal. No respiratory distress.     Breath sounds: Normal breath sounds.  Abdominal:     General: Bowel sounds are normal.     Palpations: Abdomen is soft.     Tenderness: There is no abdominal tenderness. There is no guarding or rebound.   Musculoskeletal: Normal range of motion.        General: No swelling, tenderness, deformity or signs of injury.  Skin:    General: Skin is warm and dry.     Capillary Refill: Capillary refill takes less than 2 seconds.     Findings: No bruising, erythema, lesion or rash.  Neurological:     General: No focal deficit present.     Mental Status: She is alert and oriented to person, place, and time.     Sensory: No sensory deficit.     Motor: No weakness.      UC Treatments / Results  Labs (all labs ordered are listed, but only abnormal results are displayed) Labs Reviewed - No data to display  EKG   Radiology No results found.  Procedures Procedures (including critical care time)  Medications Ordered in UC Medications - No data to display  Initial Impression / Assessment and Plan / UC Course  I have reviewed the triage vital signs and the nursing notes.  Pertinent labs & imaging results that were available during my care of the patient were reviewed by me and considered in my medical decision making (see chart for details).   Wrist pain.  Patient is well-appearing and her exam is unremarkable.  Instructed her to take Tylenol or ibuprofen as needed for pain.  Instructed her to rest and elevate her wrist; to apply ice packs as directed; wear the wrist splint provided as needed for comfort.  Instructed her to follow-up with her PCP or an orthopedist if her symptoms continue or worsen or she develops new symptoms such as numbness, tingling, weakness.  Patient agrees to plan of care.  Final Clinical Impressions(s) / UC Diagnoses   Final diagnoses:  Right wrist pain     Discharge Instructions     Take Tylenol or ibuprofen as needed for your pain.    Rest and elevate your wrist.  Apply ice packs 2-3 times a day for up to 20 minutes each.  Wear the wrist splint as needed for comfort.    Follow up with your primary care provider or an orthopedist if you symptoms continue or  worsen;  Or if  you develop new symptoms, such as numbness, tingling, or weakness.         ED Prescriptions    None     PDMP not reviewed this encounter.   Mickie Bailate, Shawnya Mayor H, NP 11/06/19 [phone]

## 2019-11-23 ENCOUNTER — Ambulatory Visit: Payer: Medicaid Other | Admitting: Obstetrics and Gynecology

## 2019-12-16 ENCOUNTER — Ambulatory Visit: Payer: Medicaid Other | Admitting: Obstetrics and Gynecology

## 2019-12-23 ENCOUNTER — Encounter: Payer: Self-pay | Admitting: Obstetrics and Gynecology

## 2019-12-23 ENCOUNTER — Ambulatory Visit: Payer: Medicaid Other | Admitting: Obstetrics and Gynecology

## 2019-12-23 NOTE — Progress Notes (Signed)
Patient did not keep her colposcopy appointment for 12/23/2019.  Durene Romans MD Attending Center for [employer])

## 2020-01-08 ENCOUNTER — Ambulatory Visit: Payer: Medicaid Other | Attending: Internal Medicine

## 2020-01-08 DIAGNOSIS — Z20822 Contact with and (suspected) exposure to covid-19: Secondary | ICD-10-CM

## 2020-01-09 LAB — NOVEL CORONAVIRUS, NAA: SARS-CoV-2, NAA: NOT DETECTED

## 2020-02-26 ENCOUNTER — Ambulatory Visit (HOSPITAL_COMMUNITY)
Admission: EM | Admit: 2020-02-26 | Discharge: 2020-02-26 | Disposition: A | Discharge status: Home / Self Care | Payer: Medicaid Other | Attending: Urgent Care | Admitting: Urgent Care

## 2020-02-26 ENCOUNTER — Encounter (HOSPITAL_COMMUNITY): Payer: Self-pay

## 2020-02-26 ENCOUNTER — Other Ambulatory Visit: Payer: Self-pay

## 2020-02-26 DIAGNOSIS — Z20822 Contact with and (suspected) exposure to covid-19: Secondary | ICD-10-CM | POA: Diagnosis not present

## 2020-02-26 DIAGNOSIS — J029 Acute pharyngitis, unspecified: Secondary | ICD-10-CM | POA: Insufficient documentation

## 2020-02-26 DIAGNOSIS — Z91013 Allergy to seafood: Secondary | ICD-10-CM | POA: Insufficient documentation

## 2020-02-26 LAB — POCT RAPID STREP A: Streptococcus, Group A Screen (Direct): NEGATIVE

## 2020-02-26 MED ORDER — NAPROXEN 500 MG PO TABS: 500.0000 mg | ORAL_TABLET | Freq: Two times a day (BID) | ORAL | 0 refills | Status: DC

## 2020-02-26 MED ORDER — PSEUDOEPHEDRINE HCL 60 MG PO TABS: 60.0000 mg | ORAL_TABLET | Freq: Three times a day (TID) | ORAL | 0 refills | Status: DC | PRN

## 2020-02-26 MED ORDER — CETIRIZINE HCL 10 MG PO TABS: 10.0000 mg | ORAL_TABLET | Freq: Every day | ORAL | 0 refills | Status: DC

## 2020-02-26 NOTE — Discharge Instructions (Signed)
For sore throat or cough try using a honey-based tea. Use 3 teaspoons of honey with juice squeezed from half lemon. Place shaved pieces of ginger into 1/2-1 cup of water and warm over stove top. Then mix the ingredients and repeat every 4 hours as needed. Please take Tylenol 500mg every 6 hours. Hydrate very well with at least 2 liters of water. Eat light meals such as soups to replenish electrolytes and soft fruits, veggies. Start an antihistamine like Zyrtec (cetirizine) at 10mg daily for postnasal drainage, sinus congestion.  You can take this together with pseudoephedrine (Sudafed) at a dose of 60 mg 3 times a day or twice daily as needed for the same kind of congestion.   

## 2020-02-26 NOTE — ED Provider Notes (Signed)
[facility]   MRN: [id number] DOB: 1996-03-10  Subjective:   Cassie Nguyen is a 24 y.o. female presenting for acute onset this morning of recurrent throat pain and sinus congestion.  Patient states that she always gets drainage from her eyes as well.  This is not happening right now.  Has a history of strep throat.  Denies any known COVID-19 contacts.  Denies taking chronic medications.  Allergies  Allergen Reactions  . Lactase Other (See Comments)    Constipation, gi upset  . Peanut-Containing Drug Products Swelling  . Shrimp [Shellfish Allergy] Itching    Makes throat itchy and dry    History reviewed. No pertinent past medical history.   History reviewed. No pertinent surgical history.  Family History  Problem Relation Age of Onset  . Cancer Mother     Social History   Tobacco Use  . Smoking status: Never Smoker  . Smokeless tobacco: Never Used  Substance Use Topics  . Alcohol use: No  . Drug use: No    ROS   Objective:   Vitals: BP 109/70 (BP Location: Left Arm)   Pulse 90   Temp 98.7 F (37.1 C) (Oral)   Resp 16   LMP 02/17/2020   SpO2 100%   Physical Exam Constitutional:      General: She is not in acute distress.    Appearance: Normal appearance. She is well-developed. She is not ill-appearing, toxic-appearing or diaphoretic.  HENT:     Head: Normocephalic and atraumatic.     Nose: Congestion present. No rhinorrhea.     Mouth/Throat:     Mouth: Mucous membranes are moist. No oral lesions.     Pharynx: No pharyngeal swelling, oropharyngeal exudate, posterior oropharyngeal erythema or uvula swelling.     Tonsils: No tonsillar exudate or tonsillar abscesses.     Comments: Postnasal drainage overlying pharynx. Eyes:     Extraocular Movements: Extraocular movements intact.     Right eye: Normal extraocular motion.     Left eye: Normal extraocular motion.     Conjunctiva/sclera: Conjunctivae normal.     Pupils: Pupils are equal,  round, and reactive to light.  Cardiovascular:     Rate and Rhythm: Normal rate and regular rhythm.     Pulses: Normal pulses.     Heart sounds: Normal heart sounds. No murmur. No friction rub. No gallop.   Pulmonary:     Effort: Pulmonary effort is normal. No respiratory distress.     Breath sounds: Normal breath sounds. No stridor. No wheezing, rhonchi or rales.  Musculoskeletal:     Cervical back: Normal range of motion and neck supple.  Lymphadenopathy:     Cervical: No cervical adenopathy.  Skin:    General: Skin is warm and dry.     Findings: No rash.  Neurological:     General: No focal deficit present.     Mental Status: She is alert and oriented to person, place, and time.  Psychiatric:        Mood and Affect: Mood normal.        Behavior: Behavior normal.        Thought Content: Thought content normal.     Results for orders placed or performed during the hospital encounter of 02/26/20 (from the past 24 hour(s))  POCT rapid strep A [facility])     Status: None   Collection Time: 02/26/20  6:56 PM  Result Value Ref Range   Streptococcus, Group A Screen (Direct) NEGATIVE  NEGATIVE    Assessment and Plan :   1. Sore throat     Suspect that patient has persistent recurrent postnasal drainage and allergic rhinitis.  Recommended starting a daily regimen of Zyrtec, use pseudoephedrine as needed as a nasal decongestant.  For symptomatic relief will use naproxen. Strep culture and COVID 19 testing pending, counseled patient on nature of COVID-19 including modes of transmission, diagnostic testing, management and supportive care.  Offered symptomatic relief. COVID 19 testing is pending. Counseled patient on potential for adverse effects with medications prescribed/recommended today, ER and return-to-clinic precautions discussed, patient verbalized understanding.   Wallis Bamberg, [place] 02/26/20 1922

## 2020-02-26 NOTE — ED Triage Notes (Addendum)
Pt c/o sore throat and sinus congestion onset this morning. Denies cough, HA, body aches, abd pain, n/v/d, or fever/chills. Has h/o of strep throat.

## 2020-02-27 LAB — NOVEL CORONAVIRUS, NAA (HOSP ORDER, SEND-OUT TO REF LAB; TAT 18-24 HRS): SARS-CoV-2, NAA: NOT DETECTED

## 2020-02-28 LAB — CULTURE, GROUP A STREP (THRC)

## 2020-09-27 ENCOUNTER — Ambulatory Visit (HOSPITAL_COMMUNITY)
Admission: EM | Admit: 2020-09-27 | Discharge: 2020-09-27 | Disposition: A | Discharge status: Home / Self Care | Payer: Medicaid Other | Attending: Emergency Medicine | Admitting: Emergency Medicine

## 2020-09-27 ENCOUNTER — Encounter (HOSPITAL_COMMUNITY): Payer: Self-pay | Admitting: *Deleted

## 2020-09-27 ENCOUNTER — Other Ambulatory Visit: Payer: Self-pay

## 2020-09-27 ENCOUNTER — Ambulatory Visit (INDEPENDENT_AMBULATORY_CARE_PROVIDER_SITE_OTHER): Payer: Medicaid Other

## 2020-09-27 DIAGNOSIS — M25561 Pain in right knee: Secondary | ICD-10-CM | POA: Diagnosis not present

## 2020-09-27 DIAGNOSIS — Z20822 Contact with and (suspected) exposure to covid-19: Secondary | ICD-10-CM | POA: Insufficient documentation

## 2020-09-27 DIAGNOSIS — Z3202 Encounter for pregnancy test, result negative: Secondary | ICD-10-CM

## 2020-09-27 DIAGNOSIS — S8991XA Unspecified injury of right lower leg, initial encounter: Secondary | ICD-10-CM | POA: Diagnosis not present

## 2020-09-27 DIAGNOSIS — Z113 Encounter for screening for infections with a predominantly sexual mode of transmission: Secondary | ICD-10-CM | POA: Diagnosis not present

## 2020-09-27 HISTORY — DX: Unspecified asthma, uncomplicated: J45.909

## 2020-09-27 LAB — POC URINE PREG, ED: Preg Test, Ur: NEGATIVE

## 2020-09-27 MED ORDER — IBUPROFEN 800 MG PO TABS: 800.0000 mg | ORAL_TABLET | Freq: Three times a day (TID) | ORAL | 0 refills | Status: DC

## 2020-09-27 NOTE — ED Provider Notes (Signed)
[facility]    CSN: [id number] Arrival date & time: 09/27/20  1146      History   Chief Complaint Chief Complaint  Patient presents with  . Knee Injury  . Exposure to STD    HPI Cassie Nguyen is a 24 y.o. female history of asthma presenting today for evaluation of knee injury, STD screening and Covid screening.  Reports yesterday she fell at work, felt a popping sensation in her knee causing her to fall.  Reports pain deeper within the joint.  Occasional sensations of instability.  Has had prior partial rupture of patellar tendon in the same knee.  Denies radiation of pain.  Also requesting STD screening, recently had intercourse with prior partner.  Denies pelvic or vaginal symptoms.  Last menstrual cycle around 09/04/2020.  Requesting Covid screening.  Denies known exposure, denies symptoms.  HPI  Past Medical History:  Diagnosis Date  . Asthma     Patient Active Problem List   Diagnosis Date Noted  . Cervical dysplasia 07/27/2019  . Abnormal uterine bleeding (AUB) 07/20/2019  . 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 No pertinent surgical history.  OB History    Gravida  0   Para  0   Term  0   Preterm  0   AB  0   Living  0     SAB  0   TAB  0   Ectopic  0   Multiple  0   Live Births  0            Home Medications    Prior to Admission medications   Medication Sig Start Date End Date Taking? Authorizing Provider  cetirizine (ZYRTEC ALLERGY) 10 MG tablet Take 1 tablet (10 mg total) by mouth daily. 02/26/20   Wallis Bamberg, PA-C  ibuprofen (ADVIL) 800 MG tablet Take 1 tablet (800 mg total) by mouth 3 (three) times daily. 09/27/20   Minnette Merida C, PA-C  pseudoephedrine (SUDAFED) 60 MG tablet Take 1 tablet (60 mg total) by mouth every 8 (eight) hours as needed for congestion. 02/26/20   Wallis Bamberg, PA-C  medroxyPROGESTERone (PROVERA) 10 MG tablet Take 1 tablet (10 mg  total) by mouth daily. Patient not taking: Reported on 07/20/2019 06/25/19 02/26/20  Merrilee Jansky, MD    Family History Family History  Problem Relation Age of Onset  . Cancer Mother     Social History Social History   Tobacco Use  . Smoking status: Never Smoker  . Smokeless tobacco: Never Used  Vaping Use  . Vaping Use: Never used  Substance Use Topics  . Alcohol use: Yes    Comment: occ  . Drug use: Yes    Types: Marijuana    Comment: occ     Allergies   Lactase, Peanut-containing drug products, and Shrimp [shellfish allergy]   Review of Systems Review of Systems  Constitutional: Negative for fatigue and fever.  Eyes: Negative for visual disturbance.  Respiratory: Negative for shortness of breath.   Cardiovascular: Negative for chest pain.  Gastrointestinal: Negative for abdominal pain, diarrhea, nausea and vomiting.  Genitourinary: Negative for dysuria, flank pain, genital sores, hematuria, menstrual problem, vaginal bleeding, vaginal discharge and vaginal pain.  Musculoskeletal: Positive for arthralgias, gait problem and joint swelling. Negative for back pain.  Skin: Negative for color change, rash and wound.  Neurological: Negative for dizziness, weakness, light-headedness and headaches.     Physical Exam Triage Vital  Signs ED Triage Vitals  Enc Vitals Group     BP      Pulse      Resp      Temp      Temp src      SpO2      Weight      Height      Head Circumference      Peak Flow      Pain Score      Pain Loc      Pain Edu?      Excl. in GC?    No data found.  Updated Vital Signs BP 114/70 (BP Location: Left Arm)   Pulse (!) 114   Temp 98.2 F (36.8 C) (Oral)   Resp 18   LMP 09/04/2020   SpO2 100%   Visual Acuity Right Eye Distance:   Left Eye Distance:   Bilateral Distance:    Right Eye Near:   Left Eye Near:    Bilateral Near:     Physical Exam Vitals and nursing note reviewed.  Constitutional:      Appearance: She is  well-developed.     Comments: No acute distress  HENT:     Head: Normocephalic and atraumatic.     Nose: Nose normal.  Eyes:     Conjunctiva/sclera: Conjunctivae normal.  Cardiovascular:     Rate and Rhythm: Normal rate.  Pulmonary:     Effort: Pulmonary effort is normal. No respiratory distress.  Abdominal:     General: There is no distension.  Musculoskeletal:        General: Normal range of motion.     Cervical back: Neck supple.     Comments: Right knee: Mild swelling to suprapatellar area, no overlying discoloration erythema or warmth, nontender to palpation over patella, medial lateral joint line, nontender over patellar tendon, full flexion and extension without significant pain elicited, no popliteal tenderness No laxity appreciated with varus and valgus stress, negative Lachman's, negative anterior and posterior drawer  Skin:    General: Skin is warm and dry.  Neurological:     Mental Status: She is alert and oriented to person, place, and time.      UC Treatments / Results  Labs (all labs ordered are listed, but only abnormal results are displayed) Labs Reviewed  SARS CORONAVIRUS 2 (TAT 6-24 HRS)  POC URINE PREG, ED  CERVICOVAGINAL ANCILLARY ONLY    EKG   Radiology DG Knee Complete 4 Views Right  Result Date: 09/27/2020 CLINICAL DATA:  Right knee pain after injury at work yesterday. Popping sensation. EXAM: RIGHT KNEE - COMPLETE 4+ VIEW COMPARISON:  Radiograph 08/24/2016 FINDINGS: No evidence of fracture or dislocation. Chronic well corticated density inferior to the patellar pole likely sequela of remote prior injury. Normal joint spaces and alignment. There is a small knee joint effusion. IMPRESSION: Small knee joint effusion.  No fracture or dislocation. Electronically Signed   By: Narda Rutherford M.D.   On: 09/27/2020 14:56    Procedures Procedures (including critical care time)  Medications Ordered in UC Medications - No data to display  Initial  Impression / Assessment and Plan / UC Course  I have reviewed the triage vital signs and the nursing notes.  Pertinent labs & imaging results that were available during my care of the patient were reviewed by me and considered in my medical decision making (see chart for details).     1.  Right knee injury-effusion present, no acute bony abnormality, knee  feels stable.  Will treat as sprain and place an Ace wrap recommend anti-inflammatories ice and elevation.  Follow-up with sports medicine for further evaluation and imaging if symptoms continuing.  2.  STD screening-vaginal swab pending.  3.  Covid screening-Covid PCR pending  Discussed strict return precautions. Patient verbalized understanding and is agreeable with plan.  Final Clinical Impressions(s) / UC Diagnoses   Final diagnoses:  Injury of right knee, initial encounter  Screen for STD (sexually transmitted disease)  Encounter for laboratory testing for COVID-19 virus     Discharge Instructions     NO fracture Use anti-inflammatories for pain/swelling. You may take up to 800 mg Ibuprofen every 8 hours with food. You may supplement Ibuprofen with Tylenol [phone] mg every 8 hours.  Ice and elevate Follow up with sports medicine if pain continuing  STD and COVID screening pending     ED Prescriptions    Medication Sig Dispense Auth. Provider   ibuprofen (ADVIL) 800 MG tablet Take 1 tablet (800 mg total) by mouth 3 (three) times daily. 21 tablet Lorretta Kerce, [place] C, PA-C     PDMP not reviewed this encounter.   Jaunice Mirza, August C, PA-C 09/27/20 1520

## 2020-09-27 NOTE — ED Notes (Signed)
Ace wrap applied by med provider

## 2020-09-27 NOTE — Discharge Instructions (Addendum)
NO fracture Use anti-inflammatories for pain/swelling. You may take up to 800 mg Ibuprofen every 8 hours with food. You may supplement Ibuprofen with Tylenol [phone] mg every 8 hours.  Ice and elevate Follow up with sports medicine if pain continuing  STD and COVID screening pending

## 2020-09-27 NOTE — ED Triage Notes (Addendum)
Patient in with complaints of right knee pain after injury at work on yesterday.  Patient was playing with another person at work and fell back and felt something in right knee pop. Knee noted to be swollen. Patient able to walk and bend knee. Patient pain increases with going up stairs. Patient also requesting STD testing due to being with ex boyfriend.Patient noted that she has a bump on pubic area that she thinks may be an ingrown hair. Patient denies any vaginal symptoms.  Patient requesting COVID test, denies any symptoms currently.

## 2020-09-28 LAB — CERVICOVAGINAL ANCILLARY ONLY
Bacterial Vaginitis (gardnerella): POSITIVE — AB
Candida Glabrata: NEGATIVE
Candida Vaginitis: NEGATIVE
Chlamydia: NEGATIVE
Comment: NEGATIVE
Comment: NEGATIVE
Comment: NEGATIVE
Comment: NEGATIVE
Comment: NEGATIVE
Comment: NORMAL
Neisseria Gonorrhea: NEGATIVE
Trichomonas: NEGATIVE

## 2020-09-28 LAB — SARS CORONAVIRUS 2 (TAT 6-24 HRS): SARS Coronavirus 2: NEGATIVE

## 2020-09-29 ENCOUNTER — Telehealth (HOSPITAL_COMMUNITY): Payer: Self-pay | Admitting: Emergency Medicine

## 2020-09-29 MED ORDER — METRONIDAZOLE 500 MG PO TABS: 500.0000 mg | ORAL_TABLET | Freq: Two times a day (BID) | ORAL | 0 refills | Status: DC

## 2020-11-10 ENCOUNTER — Encounter (HOSPITAL_COMMUNITY): Payer: Self-pay

## 2020-11-10 ENCOUNTER — Ambulatory Visit (HOSPITAL_COMMUNITY)
Admission: EM | Admit: 2020-11-10 | Discharge: 2020-11-10 | Disposition: A | Discharge status: Home / Self Care | Payer: Medicaid Other | Attending: Family Medicine | Admitting: Family Medicine

## 2020-11-10 ENCOUNTER — Other Ambulatory Visit: Payer: Self-pay

## 2020-11-10 ENCOUNTER — Ambulatory Visit (HOSPITAL_COMMUNITY): Payer: Self-pay

## 2020-11-10 DIAGNOSIS — N898 Other specified noninflammatory disorders of vagina: Secondary | ICD-10-CM | POA: Diagnosis not present

## 2020-11-10 DIAGNOSIS — Z202 Contact with and (suspected) exposure to infections with a predominantly sexual mode of transmission: Secondary | ICD-10-CM | POA: Diagnosis not present

## 2020-11-10 NOTE — ED Provider Notes (Signed)
[facility]    CSN: [id number] Arrival date & time: 11/10/20  4098      History   Chief Complaint Chief Complaint  Patient presents with  . Exposure to STD    HPI Cassie Nguyen is a 24 y.o. female.   Presenting today requesting STI testing as one of her partners told her she had gonorrhea recently. She states she has 1 female and 2 female partners currently and had vaginal irritation after having relations with one of the female partners. Took monistat which helped some but did not resolve the issue. Denies rashes, fever, pelvic pain, abdominal pain, N/V/D. Does have some white discharge at times.      Past Medical History:  Diagnosis Date  . Asthma     Patient Active Problem List   Diagnosis Date Noted  . Cervical dysplasia 07/27/2019  . Abnormal uterine bleeding (AUB) 07/20/2019  . 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 No pertinent surgical history.  OB History    Gravida  0   Para  0   Term  0   Preterm  0   AB  0   Living  0     SAB  0   TAB  0   Ectopic  0   Multiple  0   Live Births  0            Home Medications    Prior to Admission medications   Medication Sig Start Date End Date Taking? Authorizing Provider  cetirizine (ZYRTEC ALLERGY) 10 MG tablet Take 1 tablet (10 mg total) by mouth daily. 02/26/20   Wallis Bamberg, PA-C  ibuprofen (ADVIL) 800 MG tablet Take 1 tablet (800 mg total) by mouth 3 (three) times daily. 09/27/20   Wieters, Hallie C, PA-C  metroNIDAZOLE (FLAGYL) 500 MG tablet Take 1 tablet (500 mg total) by mouth 2 (two) times daily. 09/29/20   LampteyBritta Mccreedy, MD  pseudoephedrine (SUDAFED) 60 MG tablet Take 1 tablet (60 mg total) by mouth every 8 (eight) hours as needed for congestion. 02/26/20   Wallis Bamberg, PA-C  medroxyPROGESTERone (PROVERA) 10 MG tablet Take 1 tablet (10 mg total) by mouth daily. Patient not taking: Reported on 07/20/2019 06/25/19  02/26/20  Merrilee Jansky, MD    Family History Family History  Problem Relation Age of Onset  . Cancer Mother     Social History Social History   Tobacco Use  . Smoking status: Never Smoker  . Smokeless tobacco: Never Used  Vaping Use  . Vaping Use: Never used  Substance Use Topics  . Alcohol use: Yes    Comment: occ  . Drug use: Yes    Types: Marijuana    Comment: occ     Allergies   Lactase, Peanut-containing drug products, and Shrimp [shellfish allergy]   Review of Systems Review of Systems PER HPI   Physical Exam Triage Vital Signs ED Triage Vitals  Enc Vitals Group     BP 11/10/20 0900 118/87     Pulse Rate 11/10/20 0900 75     Resp 11/10/20 0900 16     Temp 11/10/20 0900 98.4 F (36.9 C)     Temp Source 11/10/20 0900 Oral     SpO2 11/10/20 0900 99 %     Weight --      Height --      Head Circumference --      Peak Flow --  Pain Score 11/10/20 0859 0     Pain Loc --      Pain Edu? --      Excl. in GC? --    No data found.  Updated Vital Signs BP 118/87 (BP Location: Right Arm)   Pulse 75   Temp 98.4 F (36.9 C) (Oral)   Resp 16   LMP 11/06/2020 (Exact Date)   SpO2 99%   Visual Acuity Right Eye Distance:   Left Eye Distance:   Bilateral Distance:    Right Eye Near:   Left Eye Near:    Bilateral Near:     Physical Exam Vitals and nursing note reviewed.  Constitutional:      Appearance: Normal appearance. She is not ill-appearing.  HENT:     Head: Atraumatic.  Eyes:     Extraocular Movements: Extraocular movements intact.     Conjunctiva/sclera: Conjunctivae normal.  Cardiovascular:     Rate and Rhythm: Normal rate and regular rhythm.     Heart sounds: Normal heart sounds.  Pulmonary:     Effort: Pulmonary effort is normal.     Breath sounds: Normal breath sounds.  Abdominal:     General: Bowel sounds are normal. There is no distension.     Palpations: Abdomen is soft.     Tenderness: There is no abdominal  tenderness. There is no guarding.  Genitourinary:    Comments: GU exam deferred, self swab performed Musculoskeletal:        General: Normal range of motion.     Cervical back: Normal range of motion and neck supple.  Skin:    General: Skin is warm and dry.  Neurological:     Mental Status: She is alert and oriented to person, place, and time.  Psychiatric:        Mood and Affect: Mood normal.        Thought Content: Thought content normal.        Judgment: Judgment normal.     UC Treatments / Results  Labs (all labs ordered are listed, but only abnormal results are displayed) Labs Reviewed  CERVICOVAGINAL ANCILLARY ONLY    EKG   Radiology No results found.  Procedures Procedures (including critical care time)  Medications Ordered in UC Medications - No data to display  Initial Impression / Assessment and Plan / UC Course  I have reviewed the triage vital signs and the nursing notes.  Pertinent labs & imaging results that were available during my care of the patient were reviewed by me and considered in my medical decision making (see chart for details).     Aptima swab pending, discussed safe sexual practices, probiotics, good vaginal hygiene. Treat based on results of swab.  Final Clinical Impressions(s) / UC Diagnoses   Final diagnoses:  Vaginal itching  Possible exposure to STD   Discharge Instructions   None    ED Prescriptions    None     PDMP not reviewed this encounter.   Particia Nearing, [place] 11/10/20 [phone]

## 2020-11-10 NOTE — ED Triage Notes (Signed)
Pt presents for STD's testing. States she is worry as 1 sexual partner told her she has gonorrhea in the throat, and pt wants to know if is possible to have gonorrhea from intercourse female to female.  States she was having vaginal itchiness, vaginal discharge  After having sex with and ex-boyfriend got out of jail  and stopped 5 days ago when he rperiod started. .States she tried to have sex with her boyfriend 1 weeks ago and was painful. Pt reports she has 3 sexual partners (2 males, 1 female).  Pt reports she was prescribed metronidazole before, and she do not want to be treat it with this medication because is "nasty" and she wont take it.

## 2020-11-11 ENCOUNTER — Ambulatory Visit (HOSPITAL_COMMUNITY)
Admission: EM | Admit: 2020-11-11 | Discharge: 2020-11-11 | Disposition: A | Discharge status: Home / Self Care | Payer: Medicaid Other | Attending: Emergency Medicine | Admitting: Emergency Medicine

## 2020-11-11 ENCOUNTER — Other Ambulatory Visit: Payer: Self-pay

## 2020-11-11 DIAGNOSIS — A549 Gonococcal infection, unspecified: Secondary | ICD-10-CM | POA: Diagnosis not present

## 2020-11-11 LAB — CERVICOVAGINAL ANCILLARY ONLY
Bacterial Vaginitis (gardnerella): NEGATIVE
Candida Glabrata: NEGATIVE
Candida Vaginitis: NEGATIVE
Chlamydia: POSITIVE — AB
Comment: NEGATIVE
Comment: NEGATIVE
Comment: NEGATIVE
Comment: NEGATIVE
Comment: NEGATIVE
Comment: NORMAL
Neisseria Gonorrhea: POSITIVE — AB
Trichomonas: NEGATIVE

## 2020-11-11 MED ORDER — DOXYCYCLINE HYCLATE 100 MG PO TABS
ORAL_TABLET | ORAL | Status: AC
  Filled 2020-11-11: qty 1

## 2020-11-11 MED ORDER — DOXYCYCLINE HYCLATE 100 MG PO CAPS: 100.0000 mg | ORAL_CAPSULE | Freq: Two times a day (BID) | ORAL | 0 refills | Status: AC

## 2020-11-11 MED ORDER — CEFTRIAXONE SODIUM 500 MG IJ SOLR
INTRAMUSCULAR | Status: AC
  Filled 2020-11-11: qty 500

## 2020-11-11 MED ORDER — DOXYCYCLINE HYCLATE 100 MG PO TABS
100.0000 mg | ORAL_TABLET | Freq: Once | ORAL | Status: AC
  Administered 2020-11-11: 100 mg via ORAL

## 2020-11-11 MED ORDER — CEFTRIAXONE SODIUM 500 MG IJ SOLR
500.0000 mg | Freq: Once | INTRAMUSCULAR | Status: AC
  Administered 2020-11-11: 500 mg via INTRAMUSCULAR

## 2020-11-11 NOTE — ED Triage Notes (Signed)
Pt presents for treatment of gonorrhea & chlamydia per swab results and provider orders.  Pt treated with 500 mg rocephin IM and 7 days doxycycline

## 2020-11-11 NOTE — Discharge Instructions (Addendum)
Pt received shot of Rocephin in UC and 1st dose of doxycycline,scripted remainder.

## 2020-11-25 ENCOUNTER — Other Ambulatory Visit: Payer: Self-pay

## 2020-11-25 ENCOUNTER — Encounter (HOSPITAL_COMMUNITY): Payer: Self-pay

## 2020-11-25 ENCOUNTER — Ambulatory Visit (HOSPITAL_COMMUNITY)
Admission: EM | Admit: 2020-11-25 | Discharge: 2020-11-25 | Disposition: A | Discharge status: Home / Self Care | Payer: Medicaid Other | Attending: Family Medicine | Admitting: Family Medicine

## 2020-11-25 DIAGNOSIS — Z202 Contact with and (suspected) exposure to infections with a predominantly sexual mode of transmission: Secondary | ICD-10-CM | POA: Insufficient documentation

## 2020-11-25 NOTE — Discharge Instructions (Addendum)
Check your results on My Chart

## 2020-11-25 NOTE — ED Triage Notes (Signed)
Pt states she is symptomatic again since treatment on the 12th of November. Pt states she would like a pelvic exam as she thinks she may have a yeast infection. Pt is aox4 and ambulatory.

## 2020-11-25 NOTE — ED Provider Notes (Signed)
[facility]    CSN: [id number] Arrival date & time: 11/25/20  1751      History   Chief Complaint Chief Complaint  Patient presents with  . SEXUALLY TRANSMITTED DISEASE    recheck pt is symptomatic x 3 days    HPI Cassie Nguyen is a 24 y.o. female.   HPI Patient was seen 2 weeks ago for vaginal symptoms.  Her swab showed GC and chlamydia.  She took the shot and antibiotics as recommended.  She is now here for recurrence of symptoms.  She felt well for several days after her treatment, now her symptoms are come back.  She thinks she may have "a yeast infection".  Denies abdominal pain.  Fever chills.  Rash.  Lesions Past Medical History:  Diagnosis Date  . Asthma     Patient Active Problem List   Diagnosis Date Noted  . Cervical dysplasia 07/27/2019  . Abnormal uterine bleeding (AUB) 07/20/2019  . 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 No pertinent surgical history.  OB History    Gravida  0   Para  0   Term  0   Preterm  0   AB  0   Living  0     SAB  0   TAB  0   Ectopic  0   Multiple  0   Live Births  0            Home Medications    Prior to Admission medications   Medication Sig Start Date End Date Taking? Authorizing Provider  cetirizine (ZYRTEC ALLERGY) 10 MG tablet Take 1 tablet (10 mg total) by mouth daily. 02/26/20  Yes Wallis Bamberg, PA-C  ibuprofen (ADVIL) 800 MG tablet Take 1 tablet (800 mg total) by mouth 3 (three) times daily. 09/27/20  Yes Wieters, Hallie C, PA-C  metroNIDAZOLE (FLAGYL) 500 MG tablet Take 1 tablet (500 mg total) by mouth 2 (two) times daily. 09/29/20   LampteyBritta Mccreedy, MD  pseudoephedrine (SUDAFED) 60 MG tablet Take 1 tablet (60 mg total) by mouth every 8 (eight) hours as needed for congestion. 02/26/20   Wallis Bamberg, PA-C  medroxyPROGESTERone (PROVERA) 10 MG tablet Take 1 tablet (10 mg total) by mouth daily. Patient not taking: Reported on  07/20/2019 06/25/19 02/26/20  Merrilee Jansky, MD    Family History Family History  Problem Relation Age of Onset  . Cancer Mother     Social History Social History   Tobacco Use  . Smoking status: Never Smoker  . Smokeless tobacco: Never Used  Vaping Use  . Vaping Use: Never used  Substance Use Topics  . Alcohol use: Yes    Comment: occ  . Drug use: Yes    Types: Marijuana    Comment: occ     Allergies   Lactase, Peanut-containing drug products, and Shrimp [shellfish allergy]   Review of Systems Review of Systems See HPI  Physical Exam Triage Vital Signs ED Triage Vitals  Enc Vitals Group     BP 11/25/20 1803 120/69     Pulse Rate 11/25/20 1803 79     Resp 11/25/20 1803 17     Temp 11/25/20 1803 98.2 F (36.8 C)     Temp Source 11/25/20 1803 Oral     SpO2 11/25/20 1803 100 %     Weight --      Height --      Head Circumference --  Peak Flow --      Pain Score 11/25/20 1805 0     Pain Loc --      Pain Edu? --      Excl. in GC? --    No data found.  Updated Vital Signs BP 120/69 (BP Location: Right Arm)   Pulse 79   Temp 98.2 F (36.8 C) (Oral)   Resp 17   LMP 11/06/2020 (Exact Date)   SpO2 100%      Physical Exam Constitutional:      General: She is not in acute distress.    Appearance: She is well-developed.  HENT:     Head: Normocephalic and atraumatic.  Eyes:     Conjunctiva/sclera: Conjunctivae normal.     Pupils: Pupils are equal, round, and reactive to light.  Cardiovascular:     Rate and Rhythm: Normal rate.  Pulmonary:     Effort: Pulmonary effort is normal. No respiratory distress.  Abdominal:     Palpations: Abdomen is soft.  Genitourinary:    Comments: Proper technique for vaginal swab reviewed Musculoskeletal:        General: Normal range of motion.     Cervical back: Normal range of motion.  Skin:    General: Skin is warm and dry.  Neurological:     Mental Status: She is alert.  Psychiatric:        Behavior:  Behavior normal.      UC Treatments / Results  Labs (all labs ordered are listed, but only abnormal results are displayed) Labs Reviewed  CERVICOVAGINAL ANCILLARY ONLY    EKG   Radiology No results found.  Procedures Procedures (including critical care time)  Medications Ordered in UC Medications - No data to display  Initial Impression / Assessment and Plan / UC Course  I have reviewed the triage vital signs and the nursing notes.  Pertinent labs & imaging results that were available during my care of the patient were reviewed by me and considered in my medical decision making (see chart for details).     Safe sex recommended Final Clinical Impressions(s) / UC Diagnoses   Final diagnoses:  Possible exposure to STD     Discharge Instructions     Check your results on My Chart   ED Prescriptions    None     PDMP not reviewed this encounter.   Eustace Moore, MD 11/25/20 [phone]

## 2020-11-28 ENCOUNTER — Telehealth (HOSPITAL_COMMUNITY): Payer: Self-pay | Admitting: Emergency Medicine

## 2020-11-28 LAB — CERVICOVAGINAL ANCILLARY ONLY
Bacterial Vaginitis (gardnerella): POSITIVE — AB
Candida Glabrata: NEGATIVE
Candida Vaginitis: POSITIVE — AB
Chlamydia: NEGATIVE
Comment: NEGATIVE
Comment: NEGATIVE
Comment: NEGATIVE
Comment: NEGATIVE
Comment: NEGATIVE
Comment: NORMAL
Neisseria Gonorrhea: NEGATIVE
Trichomonas: NEGATIVE

## 2020-11-28 MED ORDER — METRONIDAZOLE 0.75 % VA GEL: 1.0000 | Freq: Every day | VAGINAL | 0 refills | Status: AC

## 2020-11-28 MED ORDER — FLUCONAZOLE 150 MG PO TABS: 150.0000 mg | ORAL_TABLET | Freq: Once | ORAL | 0 refills | Status: AC

## 2020-12-01 ENCOUNTER — Telehealth (HOSPITAL_COMMUNITY): Payer: Self-pay | Admitting: Emergency Medicine

## 2020-12-01 MED ORDER — FLUCONAZOLE 150 MG PO TABS: 150.0000 mg | ORAL_TABLET | Freq: Once | ORAL | 0 refills | Status: AC

## 2020-12-01 NOTE — Telephone Encounter (Signed)
Patient called and states she lost her second fluconazole pill.  Reviewed with provider on site and okay'd to send to pharmacy for patient.

## 2021-01-11 ENCOUNTER — Telehealth (INDEPENDENT_AMBULATORY_CARE_PROVIDER_SITE_OTHER): Payer: Medicaid Other | Admitting: Primary Care

## 2021-01-11 ENCOUNTER — Telehealth (INDEPENDENT_AMBULATORY_CARE_PROVIDER_SITE_OTHER): Payer: Self-pay

## 2021-01-11 NOTE — Telephone Encounter (Signed)
Copied from CRM [id number]. Topic: Appointment Scheduling - Scheduling Inquiry for Clinic >> Jan 10, 2021 12:12 PM Elliot Gault wrote: Reason for CRM: patient scheduled for 01/11/2021 and states she missed a call from the office today. Patient unsure why appointment was scheduled for virtual and would prefer to come in office, please advise the patient directly regarding the status of appt. Patient answered no to all DT questions   Please advise

## 2021-01-21 ENCOUNTER — Encounter (HOSPITAL_COMMUNITY): Payer: Self-pay

## 2021-01-21 ENCOUNTER — Ambulatory Visit (HOSPITAL_COMMUNITY): Payer: Self-pay

## 2021-01-21 ENCOUNTER — Ambulatory Visit (HOSPITAL_COMMUNITY)
Admission: EM | Admit: 2021-01-21 | Discharge: 2021-01-21 | Disposition: A | Discharge status: Home / Self Care | Payer: Medicaid Other | Attending: Internal Medicine | Admitting: Internal Medicine

## 2021-01-21 ENCOUNTER — Other Ambulatory Visit: Payer: Self-pay

## 2021-01-21 DIAGNOSIS — Z113 Encounter for screening for infections with a predominantly sexual mode of transmission: Secondary | ICD-10-CM | POA: Insufficient documentation

## 2021-01-21 LAB — POCT URINALYSIS DIPSTICK, ED / UC
Bilirubin Urine: NEGATIVE
Glucose, UA: NEGATIVE mg/dL
Hgb urine dipstick: NEGATIVE
Ketones, ur: NEGATIVE mg/dL
Nitrite: NEGATIVE
Protein, ur: NEGATIVE mg/dL
Specific Gravity, Urine: 1.015 (ref 1.005–1.030)
Urobilinogen, UA: 0.2 mg/dL (ref 0.0–1.0)
pH: 5.5 (ref 5.0–8.0)

## 2021-01-21 NOTE — Discharge Instructions (Addendum)
Do not engage in any sexual activity until after your tests are back.  If you test positive for any sexually-transmitted infections we will treat you with antibiotics at that time.

## 2021-01-21 NOTE — ED Provider Notes (Signed)
[facility]    CSN: [id number] Arrival date & time: 01/21/21  1310      History   Chief Complaint Chief Complaint  Patient presents with  . Exposure to STD    No symptoms     HPI Yakima Kreitzer is a 25 y.o. female.   HPI   Patient is a 25 year old female here for screening for STIs.  Patient is concerned because last November she tested positive for gonorrhea and chlamydia after having unprotected sex with an ex-Con.  Patient reports that she was treated and completed the treatment at that time.  Patient has a current monogamous boyfriend that she uses condoms during sex with.  Patient denies any subsequent sex partners.  Patient is concerned because she recently was penetrated by her boyfriend without him wearing a condom.  Patient states that they did not have sex and she got up right away but she is concerned she may have given him something.  Patient denies vaginal discharge, itching, or pain.  Patient also denies nausea, vomiting, back pain, abdominal pain, or urinary complaints.  Past Medical History:  Diagnosis Date  . Asthma     Patient Active Problem List   Diagnosis Date Noted  . Cervical dysplasia 07/27/2019  . Abnormal uterine bleeding (AUB) 07/20/2019  . 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 No pertinent surgical history.  OB History    Gravida  0   Para  0   Term  0   Preterm  0   AB  0   Living  0     SAB  0   IAB  0   Ectopic  0   Multiple  0   Live Births  0            Home Medications    Prior to Admission medications   Medication Sig Start Date End Date Taking? Authorizing Provider  cetirizine (ZYRTEC ALLERGY) 10 MG tablet Take 1 tablet (10 mg total) by mouth daily. 02/26/20   Wallis Bamberg, PA-C  ibuprofen (ADVIL) 800 MG tablet Take 1 tablet (800 mg total) by mouth 3 (three) times daily. 09/27/20   Wieters, Hallie C, PA-C  pseudoephedrine (SUDAFED) 60 MG  tablet Take 1 tablet (60 mg total) by mouth every 8 (eight) hours as needed for congestion. 02/26/20   Wallis Bamberg, PA-C  medroxyPROGESTERone (PROVERA) 10 MG tablet Take 1 tablet (10 mg total) by mouth daily. Patient not taking: Reported on 07/20/2019 06/25/19 02/26/20  Merrilee Jansky, MD    Family History Family History  Problem Relation Age of Onset  . Cancer Mother     Social History Social History   Tobacco Use  . Smoking status: Never Smoker  . Smokeless tobacco: Never Used  Vaping Use  . Vaping Use: Never used  Substance Use Topics  . Alcohol use: Yes    Comment: occ  . Drug use: Yes    Types: Marijuana    Comment: occ     Allergies   Lactase, Peanut-containing drug products, and Shrimp [shellfish allergy]   Review of Systems Review of Systems  Constitutional: Negative for activity change, appetite change and fever.  Gastrointestinal: Negative for abdominal pain, diarrhea, nausea and vomiting.  Genitourinary: Negative for dysuria, frequency, genital sores, hematuria, pelvic pain, urgency, vaginal bleeding, vaginal discharge and vaginal pain.  Musculoskeletal: Negative for back pain.  Skin: Negative for rash.  Hematological: Negative.   Psychiatric/Behavioral: Negative.  Physical Exam Triage Vital Signs ED Triage Vitals  Enc Vitals Group     BP      Pulse      Resp      Temp      Temp src      SpO2      Weight      Height      Head Circumference      Peak Flow      Pain Score      Pain Loc      Pain Edu?      Excl. in GC?    No data found.  Updated Vital Signs BP 109/75 (BP Location: Right Arm)   Pulse 85   Temp 98.9 F (37.2 C) (Oral)   Resp 16   LMP 01/14/2021   SpO2 98%   Visual Acuity Right Eye Distance:   Left Eye Distance:   Bilateral Distance:    Right Eye Near:   Left Eye Near:    Bilateral Near:     Physical Exam Vitals and nursing note reviewed.  Constitutional:      General: She is not in acute distress.     Appearance: Normal appearance. She is normal weight. She is not toxic-appearing.  HENT:     Head: Normocephalic and atraumatic.  Cardiovascular:     Rate and Rhythm: Normal rate and regular rhythm.     Pulses: Normal pulses.     Heart sounds: Normal heart sounds. No murmur heard. No gallop.   Pulmonary:     Effort: Pulmonary effort is normal.     Breath sounds: Normal breath sounds. No wheezing, rhonchi or rales.  Abdominal:     General: Abdomen is flat. Bowel sounds are normal.     Palpations: Abdomen is soft.     Tenderness: There is no abdominal tenderness. There is no right CVA tenderness, left CVA tenderness, guarding or rebound.  Skin:    General: Skin is warm and dry.     Capillary Refill: Capillary refill takes less than 2 seconds.     Findings: No erythema or rash.  Neurological:     General: No focal deficit present.     Mental Status: She is alert and oriented to person, place, and time.  Psychiatric:        Mood and Affect: Mood normal.        Behavior: Behavior normal.        Thought Content: Thought content normal.        Judgment: Judgment normal.      UC Treatments / Results  Labs (all labs ordered are listed, but only abnormal results are displayed) Labs Reviewed  POCT URINALYSIS DIPSTICK, ED / UC - Abnormal; Notable for the following components:      Result Value   Leukocytes,Ua TRACE (*)    All other components within normal limits  URINE CULTURE  CERVICOVAGINAL ANCILLARY ONLY    EKG   Radiology No results found.  Procedures Procedures (including critical care time)  Medications Ordered in UC Medications - No data to display  Initial Impression / Assessment and Plan / UC Course  I have reviewed the triage vital signs and the nursing notes.  Pertinent labs & imaging results that were available during my care of the patient were reviewed by me and considered in my medical decision making (see chart for details).   Patient is a very pleasant  25 year old female here for testing for gonorrhea and chlamydia.  Patient had gonorrhea chlamydia back in November 2021, was treated, but is concerned that the infection did not resolve.  Patient denies having unprotected sexual intercourse with anyone since.  She denies any GU or GYN symptoms.  We will send UA and gonorrhea chlamydia swab.  Urine dip shows trace leukocytes but is otherwise unremarkable.  We will send urine for culture.   Final Clinical Impressions(s) / UC Diagnoses   Final diagnoses:  Screen for STD (sexually transmitted disease)     Discharge Instructions     Do not engage in any sexual activity until after your tests are back.  If you test positive for any sexually-transmitted infections we will treat you with antibiotics at that time.    ED Prescriptions    None     PDMP not reviewed this encounter.   Becky Augusta, NP 01/21/21 1358

## 2021-01-21 NOTE — ED Triage Notes (Signed)
Pt present exposure to STD. Pt states she is not having any symptoms but would like to be check for any std. Pt states after intercourse she sometimes is bleeding.

## 2021-01-22 LAB — URINE CULTURE: Special Requests: NORMAL

## 2021-01-23 LAB — CERVICOVAGINAL ANCILLARY ONLY
Chlamydia: NEGATIVE
Comment: NEGATIVE
Comment: NEGATIVE
Comment: NORMAL
Neisseria Gonorrhea: NEGATIVE
Trichomonas: NEGATIVE

## 2021-02-24 ENCOUNTER — Ambulatory Visit (HOSPITAL_COMMUNITY)
Admission: RE | Admit: 2021-02-24 | Discharge: 2021-02-24 | Disposition: A | Discharge status: Home / Self Care | Payer: Medicaid Other | Source: Ambulatory Visit | Attending: Student | Admitting: Student

## 2021-02-24 ENCOUNTER — Encounter (HOSPITAL_COMMUNITY): Payer: Self-pay

## 2021-02-24 ENCOUNTER — Other Ambulatory Visit: Payer: Self-pay

## 2021-02-24 ENCOUNTER — Ambulatory Visit (HOSPITAL_COMMUNITY): Payer: Self-pay

## 2021-02-24 VITALS — BP 108/75 | HR 76 | Temp 98.3°F | Resp 18

## 2021-02-24 DIAGNOSIS — B373 Candidiasis of vulva and vagina: Secondary | ICD-10-CM

## 2021-02-24 DIAGNOSIS — B3731 Acute candidiasis of vulva and vagina: Secondary | ICD-10-CM

## 2021-02-24 DIAGNOSIS — Z8619 Personal history of other infectious and parasitic diseases: Secondary | ICD-10-CM | POA: Diagnosis present

## 2021-02-24 DIAGNOSIS — Z113 Encounter for screening for infections with a predominantly sexual mode of transmission: Secondary | ICD-10-CM

## 2021-02-24 MED ORDER — FLUCONAZOLE 150 MG PO TABS: 150.0000 mg | ORAL_TABLET | Freq: Every day | ORAL | 0 refills | Status: DC

## 2021-02-24 NOTE — Discharge Instructions (Signed)
-  For your yeast infection, start the medication- diflucan (fluconazole). Take one pill today. If you're still having symptoms in 3 days, take the second pill.  -We're testing you for gonorrhea, chlamydia, trichomonas, yeast, BV. We'll call you if anything is positive and can send additional treatment.  -To discuss fertility and ovulation, follow-up with gynecologist. Information below.

## 2021-02-24 NOTE — ED Provider Notes (Signed)
[facility]    CSN: [id number] Arrival date & time: 02/24/21  1939      History   Chief Complaint Chief Complaint  Patient presents with  . APPOINTMENT : Vaginitis    HPI Cassie Nguyen is a 25 y.o. female presenting with 1 day of vaginal itching. History gonorrhea, AUB. States she noticed scant white discharge and external vaginal itching 1 day ago. Used monistat and symptoms largely resolved but she is still experiencing some external itching. Requesting full STI panel today. Only 1 new partner since last STI screening 1 month ago at this clinic. Denies abd pain, back pain, fevers/chills, current vaginal discharge, urinary frequency/urgency/dysuria. She is not currently on her period. States she is not pregnant.  HPI  Past Medical History:  Diagnosis Date  . Asthma     Patient Active Problem List   Diagnosis Date Noted  . Cervical dysplasia 07/27/2019  . Abnormal uterine bleeding (AUB) 07/20/2019  . 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 No pertinent surgical history.  OB History    Gravida  0   Para  0   Term  0   Preterm  0   AB  0   Living  0     SAB  0   IAB  0   Ectopic  0   Multiple  0   Live Births  0            Home Medications    Prior to Admission medications   Medication Sig Start Date End Date Taking? Authorizing Provider  fluconazole (DIFLUCAN) 150 MG tablet Take 1 tablet (150 mg total) by mouth daily. 02/24/21  Yes Rhys Martini, PA-C  cetirizine (ZYRTEC ALLERGY) 10 MG tablet Take 1 tablet (10 mg total) by mouth daily. 02/26/20   Wallis Bamberg, PA-C  ibuprofen (ADVIL) 800 MG tablet Take 1 tablet (800 mg total) by mouth 3 (three) times daily. 09/27/20   Wieters, Hallie C, PA-C  pseudoephedrine (SUDAFED) 60 MG tablet Take 1 tablet (60 mg total) by mouth every 8 (eight) hours as needed for congestion. 02/26/20   Wallis Bamberg, PA-C  medroxyPROGESTERone (PROVERA) 10 MG  tablet Take 1 tablet (10 mg total) by mouth daily. Patient not taking: Reported on 07/20/2019 06/25/19 02/26/20  Merrilee Jansky, MD    Family History Family History  Problem Relation Age of Onset  . Cancer Mother     Social History Social History   Tobacco Use  . Smoking status: Never Smoker  . Smokeless tobacco: Never Used  Vaping Use  . Vaping Use: Never used  Substance Use Topics  . Alcohol use: Yes    Comment: occ  . Drug use: Yes    Types: Marijuana    Comment: occ     Allergies   Lactase, Peanut-containing drug products, and Shrimp [shellfish allergy]   Review of Systems Review of Systems  Constitutional: Negative for appetite change, chills and fever.  HENT: Negative for congestion, ear pain, rhinorrhea, sinus pressure, sinus pain and sore throat.   Eyes: Negative for pain, redness and visual disturbance.  Respiratory: Negative for cough, chest tightness, shortness of breath and wheezing.   Cardiovascular: Negative for chest pain and palpitations.  Gastrointestinal: Negative for abdominal pain, constipation, diarrhea, nausea and vomiting.  Genitourinary: Negative for decreased urine volume, difficulty urinating, dysuria, flank pain, frequency, genital sores, hematuria, menstrual problem, pelvic pain, urgency, vaginal bleeding, vaginal discharge and vaginal pain.  Musculoskeletal: Negative for back pain and myalgias.  Skin: Negative for rash.  Neurological: Negative for dizziness, weakness and headaches.  Psychiatric/Behavioral: Negative for confusion.  All other systems reviewed and are negative.    Physical Exam Triage Vital Signs ED Triage Vitals  Enc Vitals Group     BP 02/24/21 1953 108/75     Pulse Rate 02/24/21 1953 76     Resp 02/24/21 1953 18     Temp 02/24/21 1953 98.3 F (36.8 C)     Temp Source 02/24/21 1953 Oral     SpO2 02/24/21 1953 100 %     Weight --      Height --      Head Circumference --      Peak Flow --      Pain Score  02/24/21 1951 0     Pain Loc --      Pain Edu? --      Excl. in GC? --    No data found.  Updated Vital Signs BP 108/75 (BP Location: Right Arm)   Pulse 76   Temp 98.3 F (36.8 C) (Oral)   Resp 18   LMP 01/31/2021   SpO2 100%   Visual Acuity Right Eye Distance:   Left Eye Distance:   Bilateral Distance:    Right Eye Near:   Left Eye Near:    Bilateral Near:     Physical Exam Vitals reviewed.  Constitutional:      General: She is not in acute distress.    Appearance: Normal appearance. She is not ill-appearing.  HENT:     Head: Normocephalic and atraumatic.  Cardiovascular:     Rate and Rhythm: Normal rate and regular rhythm.     Heart sounds: Normal heart sounds.  Pulmonary:     Effort: Pulmonary effort is normal.     Breath sounds: Normal breath sounds. No wheezing, rhonchi or rales.  Abdominal:     General: Bowel sounds are normal. There is no distension.     Palpations: Abdomen is soft. There is no mass.     Tenderness: There is no abdominal tenderness. There is no right CVA tenderness, left CVA tenderness, guarding or rebound.  Neurological:     General: No focal deficit present.     Mental Status: She is alert and oriented to person, place, and time.  Psychiatric:        Mood and Affect: Mood normal.        Behavior: Behavior normal.      UC Treatments / Results  Labs (all labs ordered are listed, but only abnormal results are displayed) Labs Reviewed  CERVICOVAGINAL ANCILLARY ONLY    EKG   Radiology No results found.  Procedures Procedures (including critical care time)  Medications Ordered in UC Medications - No data to display  Initial Impression / Assessment and Plan / UC Course  I have reviewed the triage vital signs and the nursing notes.  Pertinent labs & imaging results that were available during my care of the patient were reviewed by me and considered in my medical decision making (see chart for details).     This patient is  a 25 year old female presenting for vaginal candida. Diflucan sent as below.   Will send for G/C, trich, yeast, BV testing. Declines HIV, RPR. Abstain until negative result.  Pt with history of gonorrhea in the past. Denies chance she could be pregnant.  Return precautions discussed. abd pain, back pain, new fevers/chills, worsening of discharge  despite treatment, etc.   Patient is desiring fertility counseling so provided information about OB-GYN services.   Spent over 30 minutes obtaining H&P, performing physical, discussing treatment plan and plan for follow-up with patient. Patient agrees with plan.    Final Clinical Impressions(s) / UC Diagnoses   Final diagnoses:  Vaginal candida  Routine screening for STI (sexually transmitted infection)  History of gonorrhea     Discharge Instructions     -For your yeast infection, start the medication- diflucan (fluconazole). Take one pill today. If you're still having symptoms in 3 days, take the second pill.  -We're testing you for gonorrhea, chlamydia, trichomonas, yeast, BV. We'll call you if anything is positive and can send additional treatment.  -To discuss fertility and ovulation, follow-up with gynecologist. Information below.     ED Prescriptions    Medication Sig Dispense Auth. Provider   fluconazole (DIFLUCAN) 150 MG tablet Take 1 tablet (150 mg total) by mouth daily. 2 tablet Rhys Martini, PA-C     PDMP not reviewed this encounter.   Rhys Martini, PA-C 02/24/21 2105

## 2021-02-24 NOTE — ED Triage Notes (Signed)
Pt presents with vaginal itching since yesterday.

## 2021-02-25 ENCOUNTER — Encounter (INDEPENDENT_AMBULATORY_CARE_PROVIDER_SITE_OTHER): Payer: Self-pay | Admitting: Primary Care

## 2021-02-27 ENCOUNTER — Telehealth (HOSPITAL_COMMUNITY): Payer: Self-pay | Admitting: Emergency Medicine

## 2021-02-27 LAB — CERVICOVAGINAL ANCILLARY ONLY
Bacterial Vaginitis (gardnerella): POSITIVE — AB
Candida Glabrata: NEGATIVE
Candida Vaginitis: NEGATIVE
Chlamydia: NEGATIVE
Comment: NEGATIVE
Comment: NEGATIVE
Comment: NEGATIVE
Comment: NEGATIVE
Comment: NEGATIVE
Comment: NORMAL
Neisseria Gonorrhea: NEGATIVE
Trichomonas: NEGATIVE

## 2021-02-27 MED ORDER — METRONIDAZOLE 0.75 % VA GEL: 1.0000 | Freq: Every day | VAGINAL | 0 refills | Status: AC

## 2021-02-27 MED ORDER — METRONIDAZOLE 500 MG PO TABS: 500.0000 mg | ORAL_TABLET | Freq: Two times a day (BID) | ORAL | 0 refills | Status: DC

## 2021-02-27 NOTE — Telephone Encounter (Signed)
Patient wanted gel instead of pills

## 2021-03-15 ENCOUNTER — Encounter (INDEPENDENT_AMBULATORY_CARE_PROVIDER_SITE_OTHER): Payer: Self-pay

## 2021-03-15 ENCOUNTER — Ambulatory Visit (INDEPENDENT_AMBULATORY_CARE_PROVIDER_SITE_OTHER): Payer: Medicaid Other | Admitting: Primary Care

## 2021-04-02 ENCOUNTER — Ambulatory Visit (HOSPITAL_COMMUNITY)
Admission: RE | Admit: 2021-04-02 | Discharge: 2021-04-02 | Disposition: A | Discharge status: Home / Self Care | Payer: Medicaid Other | Source: Ambulatory Visit | Attending: Emergency Medicine | Admitting: Emergency Medicine

## 2021-04-02 ENCOUNTER — Encounter (HOSPITAL_COMMUNITY): Payer: Self-pay

## 2021-04-02 ENCOUNTER — Other Ambulatory Visit: Payer: Self-pay

## 2021-04-02 VITALS — BP 114/79 | HR 100 | Temp 98.5°F | Resp 17

## 2021-04-02 DIAGNOSIS — N898 Other specified noninflammatory disorders of vagina: Secondary | ICD-10-CM | POA: Diagnosis not present

## 2021-04-02 DIAGNOSIS — Z113 Encounter for screening for infections with a predominantly sexual mode of transmission: Secondary | ICD-10-CM

## 2021-04-02 LAB — POCT URINALYSIS DIPSTICK, ED / UC
Bilirubin Urine: NEGATIVE
Glucose, UA: NEGATIVE mg/dL
Hgb urine dipstick: NEGATIVE
Ketones, ur: NEGATIVE mg/dL
Leukocytes,Ua: NEGATIVE
Nitrite: NEGATIVE
Protein, ur: NEGATIVE mg/dL
Specific Gravity, Urine: 1.02 (ref 1.005–1.030)
Urobilinogen, UA: 0.2 mg/dL (ref 0.0–1.0)
pH: 6 (ref 5.0–8.0)

## 2021-04-02 LAB — POC URINE PREG, ED: Preg Test, Ur: NEGATIVE

## 2021-04-02 NOTE — Discharge Instructions (Signed)
Follow up with women's care.   We will contact you with the results from your lab work and any additional treatment.     Do not have sex while taking undergoing treatment for STI.  Make sure that all of your partners get tested and treated.   Use a condom or other barrier method for all sexual encounters.    Return or go to the Emergency Department if symptoms worsen or do not improve in the next few days.

## 2021-04-02 NOTE — ED Provider Notes (Signed)
[facility]    CSN: [id number] Arrival date & time: 04/02/21  1708      History   Chief Complaint Chief Complaint  Patient presents with  . APPOINTMENT : STD Testing    HPI Cassie Nguyen is a 25 y.o. female.   Patient here for evaluation of on going vaginal itching and discharge.  Most recently evaluated in February and diagnosed with BV.  Reports symptoms resolved with treatment but itching and discharge recently returned.  Reports concern for genital warts.  Denies any odor or pelvic pain.  Has not tried any treatment for this episode of itching. Denies any specific alleviating or aggravating factors.  Denies any fevers, chest pain, shortness of breath, N/V/D, numbness, tingling, weakness, abdominal pain, or headaches.   ROS: As per HPI, all other pertinent ROS negative   The history is provided by the patient.    Past Medical History:  Diagnosis Date  . Asthma     Patient Active Problem List   Diagnosis Date Noted  . Cervical dysplasia 07/27/2019  . Abnormal uterine bleeding (AUB) 07/20/2019  . 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 No pertinent surgical history.  OB History    Gravida  0   Para  0   Term  0   Preterm  0   AB  0   Living  0     SAB  0   IAB  0   Ectopic  0   Multiple  0   Live Births  0            Home Medications    Prior to Admission medications   Medication Sig Start Date End Date Taking? Authorizing Provider  cetirizine (ZYRTEC ALLERGY) 10 MG tablet Take 1 tablet (10 mg total) by mouth daily. 02/26/20   Wallis Bamberg, PA-C  fluconazole (DIFLUCAN) 150 MG tablet Take 1 tablet (150 mg total) by mouth daily. 02/24/21   Rhys Martini, PA-C  ibuprofen (ADVIL) 800 MG tablet Take 1 tablet (800 mg total) by mouth 3 (three) times daily. 09/27/20   Wieters, Hallie C, PA-C  metroNIDAZOLE (FLAGYL) 500 MG tablet Take 1 tablet (500 mg total) by mouth 2 (two) times  daily. 02/27/21   Merrilee Jansky, MD  pseudoephedrine (SUDAFED) 60 MG tablet Take 1 tablet (60 mg total) by mouth every 8 (eight) hours as needed for congestion. 02/26/20   Wallis Bamberg, PA-C  medroxyPROGESTERone (PROVERA) 10 MG tablet Take 1 tablet (10 mg total) by mouth daily. Patient not taking: Reported on 07/20/2019 06/25/19 02/26/20  Merrilee Jansky, MD    Family History Family History  Problem Relation Age of Onset  . Cancer Mother     Social History Social History   Tobacco Use  . Smoking status: Never Smoker  . Smokeless tobacco: Never Used  Vaping Use  . Vaping Use: Never used  Substance Use Topics  . Alcohol use: Yes    Comment: occ  . Drug use: Yes    Types: Marijuana    Comment: occ     Allergies   Lactase, Peanut-containing drug products, and Shrimp [shellfish allergy]   Review of Systems Review of Systems  Genitourinary: Positive for genital sores and vaginal discharge. Negative for pelvic pain and vaginal bleeding.     Physical Exam Triage Vital Signs ED Triage Vitals [04/02/21 1726]  Enc Vitals Group     BP 114/79     Pulse  Rate 100     Resp 17     Temp 98.5 F (36.9 C)     Temp Source Oral     SpO2 97 %     Weight      Height      Head Circumference      Peak Flow      Pain Score 0     Pain Loc      Pain Edu?      Excl. in GC?    No data found.  Updated Vital Signs BP 114/79 (BP Location: Left Arm)   Pulse 100   Temp 98.5 F (36.9 C) (Oral)   Resp 17   LMP 03/10/2021   SpO2 97%   Visual Acuity Right Eye Distance:   Left Eye Distance:   Bilateral Distance:    Right Eye Near:   Left Eye Near:    Bilateral Near:     Physical Exam Vitals and nursing note reviewed.  Constitutional:      General: She is not in acute distress.    Appearance: Normal appearance. She is not ill-appearing, toxic-appearing or diaphoretic.  HENT:     Head: Normocephalic and atraumatic.  Eyes:     Conjunctiva/sclera: Conjunctivae normal.   Cardiovascular:     Rate and Rhythm: Normal rate.     Pulses: Normal pulses.  Pulmonary:     Effort: Pulmonary effort is normal.  Abdominal:     General: Abdomen is flat.  Genitourinary:    General: Normal vulva.     Exam position: Lithotomy position.     Pubic Area: No rash or pubic lice.      Labia:        Right: No rash, tenderness, lesion or injury.        Left: No rash, tenderness, lesion or injury.      Urethra: No prolapse, urethral pain, urethral swelling or urethral lesion.  Musculoskeletal:        General: Normal range of motion.     Cervical back: Normal range of motion.  Skin:    General: Skin is warm and dry.  Neurological:     General: No focal deficit present.     Mental Status: She is alert and oriented to person, place, and time.  Psychiatric:        Mood and Affect: Mood normal.      UC Treatments / Results  Labs (all labs ordered are listed, but only abnormal results are displayed) Labs Reviewed  POCT URINALYSIS DIPSTICK, ED / UC  POC URINE PREG, ED  CERVICOVAGINAL ANCILLARY ONLY    EKG   Radiology No results found.  Procedures Procedures (including critical care time)  Medications Ordered in UC Medications - No data to display  Initial Impression / Assessment and Plan / UC Course  I have reviewed the triage vital signs and the nursing notes.  Pertinent labs & imaging results that were available during my care of the patient were reviewed by me and considered in my medical decision making (see chart for details).     Vaginal itching Assessment negative for red flags or concerns.  No lesions or warts noted on genitalia.  Expresses concern over possible genital warts or HPV but explained that we are unable to test for those without actual lesion present.  Recommend follow-up with GYN for continued itching and discharge. Self swab obtained and will treat based on results. Safe sex practices discussed including condoms or other barrier  methods.  Follow-up as needed  Final Clinical Impressions(s) / UC Diagnoses   Final diagnoses:  Vaginal itching  Screen for STD (sexually transmitted disease)     Discharge Instructions     Follow up with women's care.   We will contact you with the results from your lab work and any additional treatment.     Do not have sex while taking undergoing treatment for STI.  Make sure that all of your partners get tested and treated.   Use a condom or other barrier method for all sexual encounters.    Return or go to the Emergency Department if symptoms worsen or do not improve in the next few days.      ED Prescriptions    None     PDMP not reviewed this encounter.   Ivette Loyal, NP 04/02/21 [phone]

## 2021-04-02 NOTE — ED Triage Notes (Signed)
Pt complains of ongoing vaginal itching.

## 2021-04-03 LAB — CERVICOVAGINAL ANCILLARY ONLY
Bacterial Vaginitis (gardnerella): NEGATIVE
Candida Glabrata: NEGATIVE
Candida Vaginitis: NEGATIVE
Chlamydia: NEGATIVE
Comment: NEGATIVE
Comment: NEGATIVE
Comment: NEGATIVE
Comment: NEGATIVE
Comment: NEGATIVE
Comment: NORMAL
Neisseria Gonorrhea: NEGATIVE
Trichomonas: NEGATIVE

## 2021-04-28 ENCOUNTER — Ambulatory Visit (HOSPITAL_COMMUNITY): Payer: Self-pay

## 2021-04-28 ENCOUNTER — Encounter (HOSPITAL_COMMUNITY): Payer: Self-pay

## 2021-04-28 ENCOUNTER — Other Ambulatory Visit: Payer: Self-pay

## 2021-04-28 ENCOUNTER — Ambulatory Visit (HOSPITAL_COMMUNITY)
Admission: EM | Admit: 2021-04-28 | Discharge: 2021-04-28 | Disposition: A | Discharge status: Home / Self Care | Payer: Medicaid Other | Attending: Emergency Medicine | Admitting: Emergency Medicine

## 2021-04-28 DIAGNOSIS — N898 Other specified noninflammatory disorders of vagina: Secondary | ICD-10-CM | POA: Insufficient documentation

## 2021-04-28 NOTE — Discharge Instructions (Signed)
Don't use any more monistat or other products until we get your test results back.  We will notify of you any positive findings or if any changes to treatment are needed. If normal or otherwise without concern to your results, we will not call you. Please log on to your MyChart to review your results if interested in so.

## 2021-04-28 NOTE — ED Provider Notes (Signed)
[facility]    CSN: [id number] Arrival date & time: 04/28/21  1124      History   Chief Complaint Chief Complaint  Patient presents with  . Vaginal Itching    HPI Cassie Nguyen is a 25 y.o. female.   Cassie Nguyen presents with complaints of vaginal itching which started approximately two days ago. She took a left over diflucan, and then still had some itching so started using monistat last night. Discharge had decreased. Monistat caused some burning for her. No pelvic pain. Has had some vaginal spotting. Her period was otherwise normal. She is interested in std screening, no specific known exposures, however. Negative cytology here three weeks ago.    ROS per HPI, negative if not otherwise mentioned.      Past Medical History:  Diagnosis Date  . Asthma     Patient Active Problem List   Diagnosis Date Noted  . Cervical dysplasia 07/27/2019  . Abnormal uterine bleeding (AUB) 07/20/2019  . 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 No pertinent surgical history.  OB History    Gravida  0   Para  0   Term  0   Preterm  0   AB  0   Living  0     SAB  0   IAB  0   Ectopic  0   Multiple  0   Live Births  0            Home Medications    Prior to Admission medications   Medication Sig Start Date End Date Taking? Authorizing Provider  ibuprofen (ADVIL) 800 MG tablet Take 1 tablet (800 mg total) by mouth 3 (three) times daily. 09/27/20  Yes Wieters, Hallie C, PA-C  pseudoephedrine (SUDAFED) 60 MG tablet Take 1 tablet (60 mg total) by mouth every 8 (eight) hours as needed for congestion. 02/26/20  Yes Wallis Bamberg, PA-C  cetirizine (ZYRTEC ALLERGY) 10 MG tablet Take 1 tablet (10 mg total) by mouth daily. 02/26/20   Wallis Bamberg, PA-C  fluconazole (DIFLUCAN) 150 MG tablet Take 1 tablet (150 mg total) by mouth daily. 02/24/21   Rhys Martini, PA-C  metroNIDAZOLE (FLAGYL) 500 MG  tablet Take 1 tablet (500 mg total) by mouth 2 (two) times daily. 02/27/21   Merrilee Jansky, MD  medroxyPROGESTERone (PROVERA) 10 MG tablet Take 1 tablet (10 mg total) by mouth daily. Patient not taking: Reported on 07/20/2019 06/25/19 02/26/20  Merrilee Jansky, MD    Family History Family History  Problem Relation Age of Onset  . Cancer Mother     Social History Social History   Tobacco Use  . Smoking status: Never Smoker  . Smokeless tobacco: Never Used  Vaping Use  . Vaping Use: Never used  Substance Use Topics  . Alcohol use: Yes    Comment: occ  . Drug use: Yes    Types: Marijuana    Comment: occ     Allergies   Peanut-containing drug products, Shrimp [shellfish allergy], Watermelon flavor, and Lactase   Review of Systems Review of Systems   Physical Exam Triage Vital Signs ED Triage Vitals  Enc Vitals Group     BP 04/28/21 1216 106/75     Pulse Rate 04/28/21 1216 76     Resp 04/28/21 1216 18     Temp 04/28/21 1216 97.9 F (36.6 C)     Temp src --  SpO2 04/28/21 1216 100 %     Weight --      Height --      Head Circumference --      Peak Flow --      Pain Score 04/28/21 1211 0     Pain Loc --      Pain Edu? --      Excl. in GC? --    No data found.  Updated Vital Signs BP 106/75   Pulse 76   Temp 97.9 F (36.6 C)   Resp 18   LMP 04/08/2021   SpO2 100%   Visual Acuity Right Eye Distance:   Left Eye Distance:   Bilateral Distance:    Right Eye Near:   Left Eye Near:    Bilateral Near:     Physical Exam Constitutional:      General: She is not in acute distress.    Appearance: She is well-developed.  Cardiovascular:     Rate and Rhythm: Normal rate.  Pulmonary:     Effort: Pulmonary effort is normal.  Abdominal:     Palpations: Abdomen is not rigid.     Tenderness: There is no abdominal tenderness. There is no guarding or rebound.  Genitourinary:    Comments: Denies sores, lesions, vaginal bleeding; no pelvic pain; gu exam  deferred at this time, vaginal self swab collected.   Skin:    General: Skin is warm and dry.  Neurological:     Mental Status: She is alert and oriented to person, place, and time.      UC Treatments / Results  Labs (all labs ordered are listed, but only abnormal results are displayed) Labs Reviewed  CERVICOVAGINAL ANCILLARY ONLY    EKG   Radiology No results found.  Procedures Procedures (including critical care time)  Medications Ordered in UC Medications - No data to display  Initial Impression / Assessment and Plan / UC Course  I have reviewed the triage vital signs and the nursing notes.  Pertinent labs & imaging results that were available during my care of the patient were reviewed by me and considered in my medical decision making (see chart for details).     Recommend to stop monistat and will await final cytology results for any further treatment. Return precautions provided. Patient verbalized understanding and agreeable to plan.   Final Clinical Impressions(s) / UC Diagnoses   Final diagnoses:  Vaginal itching     Discharge Instructions     Don't use any more monistat or other products until we get your test results back.  We will notify of you any positive findings or if any changes to treatment are needed. If normal or otherwise without concern to your results, we will not call you. Please log on to your MyChart to review your results if interested in so.     ED Prescriptions    None     PDMP not reviewed this encounter.   Georgetta Haber, NP 04/28/21 1358

## 2021-04-28 NOTE — ED Triage Notes (Signed)
Pt c/o vaginal itching since 04/24/21 with white discharge. States took one dose of diflucan which she had left over on 04/26/21. Noted that itching and white discoloration was still present was still present so has been using monostat. States monostat was causing a lot of burning last night so pt wanted to be seen.

## 2021-05-01 LAB — CERVICOVAGINAL ANCILLARY ONLY
Bacterial Vaginitis (gardnerella): NEGATIVE
Candida Glabrata: NEGATIVE
Candida Vaginitis: POSITIVE — AB
Chlamydia: NEGATIVE
Comment: NEGATIVE
Comment: NEGATIVE
Comment: NEGATIVE
Comment: NEGATIVE
Comment: NEGATIVE
Comment: NORMAL
Neisseria Gonorrhea: NEGATIVE
Trichomonas: POSITIVE — AB

## 2021-05-02 ENCOUNTER — Other Ambulatory Visit (HOSPITAL_COMMUNITY)
Admission: RE | Admit: 2021-05-02 | Discharge: 2021-05-02 | Disposition: A | Discharge status: Home / Self Care | Payer: Medicaid Other | Source: Ambulatory Visit | Attending: Family Medicine | Admitting: Family Medicine

## 2021-05-02 ENCOUNTER — Encounter: Payer: Self-pay | Admitting: Family Medicine

## 2021-05-02 ENCOUNTER — Ambulatory Visit (INDEPENDENT_AMBULATORY_CARE_PROVIDER_SITE_OTHER): Payer: Medicaid Other | Admitting: Family Medicine

## 2021-05-02 ENCOUNTER — Telehealth (HOSPITAL_COMMUNITY): Payer: Self-pay | Admitting: Emergency Medicine

## 2021-05-02 ENCOUNTER — Other Ambulatory Visit: Payer: Self-pay

## 2021-05-02 VITALS — BP 103/69 | HR 80 | Ht 64.0 in | Wt 130.0 lb

## 2021-05-02 DIAGNOSIS — A599 Trichomoniasis, unspecified: Secondary | ICD-10-CM | POA: Diagnosis not present

## 2021-05-02 DIAGNOSIS — Z124 Encounter for screening for malignant neoplasm of cervix: Secondary | ICD-10-CM

## 2021-05-02 DIAGNOSIS — R87611 Atypical squamous cells cannot exclude high grade squamous intraepithelial lesion on cytologic smear of cervix (ASC-H): Secondary | ICD-10-CM | POA: Diagnosis not present

## 2021-05-02 MED ORDER — METRONIDAZOLE 500 MG PO TABS: 500.0000 mg | ORAL_TABLET | Freq: Two times a day (BID) | ORAL | 0 refills | Status: DC

## 2021-05-02 MED ORDER — FLUCONAZOLE 150 MG PO TABS: 150.0000 mg | ORAL_TABLET | Freq: Once | ORAL | 0 refills | Status: DC

## 2021-05-02 NOTE — Patient Instructions (Signed)
Trichomoniasis Trichomoniasis is an STI (sexually transmitted infection) that can affect both women and men. In women, the outer area of the female genitalia (vulva) and the vagina are affected. In men, mainly the penis is affected, but the prostate and other reproductive organs can also be involved.  This condition can be treated with medicine. It often has no symptoms (is asymptomatic), especially in men. If not treated, trichomoniasis can last for months or years. What are the causes? This condition is caused by a parasite called Trichomonas vaginalis. Trichomoniasis most often spreads from person to person (is contagious) through sexual contact. What increases the risk? The following factors may make you more likely to develop this condition:  Having unprotected sex.  Having sex with a partner who has trichomoniasis.  Having multiple sexual partners.  Having had previous trichomoniasis infections or other STIs. What are the signs or symptoms? In women, symptoms of trichomoniasis include:  Abnormal vaginal discharge that is clear, white, gray, or yellow-green and foamy and has an unusual "fishy" odor.  Itching and irritation of the vagina and vulva.  Burning or pain during urination or sex.  Redness and swelling of the genitals. In men, symptoms of trichomoniasis include:  Penile discharge that may be foamy or contain pus.  Pain in the penis. This may happen only when urinating.  Itching or irritation inside the penis.  Burning after urination or ejaculation. How is this diagnosed? In women, this condition may be found during a routine Pap test or physical exam. It may be found in men during a routine physical exam. Your health care provider may do tests to help diagnose this infection, such as:  Urine tests (men and women).  The following in women: ? Testing the pH of the vagina. ? A vaginal swab test that checks for the Trichomonas vaginalis parasite. ? Testing vaginal  secretions. Your health care provider may test you for other STIs, including HIV (human immunodeficiency virus). How is this treated? This condition is treated with medicine taken by mouth (orally), such as metronidazole or tinidazole, to fight the infection. Your sexual partner(s) also need to be tested and treated.  If you are a woman and you plan to become pregnant or think you may be pregnant, tell your health care provider right away. Some medicines that are used to treat the infection should not be taken during pregnancy. Your health care provider may recommend over-the-counter medicines or creams to help relieve itching or irritation. You may be tested for infection again 3 months after treatment.   Follow these instructions at home:  Take and use over-the-counter and prescription medicines, including creams, only as told by your health care provider.  Take your antibiotic medicine as told by your health care provider. Do not stop taking the antibiotic even if you start to feel better.  Do not have sex until 7-10 days after you finish your medicine, or until your health care provider approves. Ask your health care provider when you may start to have sex again.  (Women) Do not douche or wear tampons while you have the infection.  Discuss your infection with your sexual partner(s). Make sure that your partner gets tested and treated, if necessary.  Keep all follow-up visits as told by your health care provider. This is important. How is this prevented?  Use condoms every time you have sex. Using condoms correctly and consistently can help protect against STIs.  Avoid having multiple sexual partners.  Talk with your sexual partner about   any symptoms that either of you may have, as well as any history of STIs.  Get tested for STIs and STDs (sexually transmitted diseases) before you have sex. Ask your partner to do the same.  Do not have sexual contact if you have symptoms of  trichomoniasis or another STI.   Contact a health care provider if:  You still have symptoms after you finish your medicine.  You develop pain in your abdomen.  You have pain when you urinate.  You have bleeding after sex.  You develop a rash.  You feel nauseous or you vomit.  You plan to become pregnant or think you may be pregnant. Summary  Trichomoniasis is an STI (sexually transmitted infection) that can affect both women and men.  This condition often has no symptoms (is asymptomatic), especially in men.  Without treatment, this condition can last for months or years.  You should not have sex until 7-10 days after you finish your medicine, or until your health care provider approves. Ask your health care provider when you may start to have sex again.  Discuss your infection with your sexual partner(s). Make sure that your partner gets tested and treated, if necessary. This information is not intended to replace advice given to you by your health care provider. Make sure you discuss any questions you have with your health care provider. Document Revised: 09/30/2018 Document Reviewed: 09/30/2018 Elsevier Patient Education  2021 Elsevier Inc.  

## 2021-05-02 NOTE — Assessment & Plan Note (Signed)
Last pap from almost two years prior ASC-H. Repeat collected today, cervix with benign appearance. Discussed need for possible colposcopy, follow up pending results.

## 2021-05-02 NOTE — Progress Notes (Signed)
GYNECOLOGY OFFICE VISIT NOTE  History:   Cassie Nguyen is a 25 y.o. G0P0000 here today for referral from urgent care.  Patient with many visits over the past few months to urgent care for vaginal itching and other GU symptoms Reports that today she has no symptoms at all, referred for frequent visits it seems Reports she was recently diagnosed with trichomonas but has not yet taken treatment and neither has her partner  Past Medical History:  Diagnosis Date  . Asthma     History reviewed. No pertinent surgical history.  The following portions of the patient's history were reviewed and updated as appropriate: allergies, current medications, past family history, past medical history, past social history, past surgical history and problem list.   Health Maintenance:     Last pap:    Component Value Date/Time   DIAGPAP (A) 07/20/2019 0000    ATYPICAL SQUAMOUS CELLS CANNOT EXCLUDE A HIGH GRADE SQUAMOUS INTRAEPITHELIAL LESION (ASC-H).   ADEQPAP (A) 07/20/2019 0000    Satisfactory for evaluation  endocervical/transformation zone component PRESENT.   Normal mammogram: n/a.   Review of Systems:  Pertinent items noted in HPI and remainder of comprehensive ROS otherwise negative.  Physical Exam:  BP 103/69   Pulse 80   Ht 5\' 4"  (1.626 m)   Wt 130 lb (59 kg)   LMP 04/08/2021   BMI 22.31 kg/m  CONSTITUTIONAL: Well-developed, well-nourished female in no acute distress.  HEENT:  Normocephalic, atraumatic. External right and left ear normal. No scleral icterus.  NECK: Normal range of motion, supple, no masses noted on observation SKIN: No rash noted. Not diaphoretic. No erythema. No pallor. MUSCULOSKELETAL: Normal range of motion. No edema noted. NEUROLOGIC: Alert and oriented to person, place, and time. Normal muscle tone coordination.  PSYCHIATRIC: Normal mood and affect. Normal behavior. Normal judgment and thought content. RESPIRATORY: Effort normal, no problems with  respiration noted ABDOMEN: No masses noted. No other overt distention noted.   PELVIC: Normal appearing external genitalia; normal appearing vaginal mucosa and cervix.  No abnormal discharge noted.    Labs and Imaging Results for orders placed or performed during the hospital encounter of 04/28/21 (from the past 168 hour(s))  Cervicovaginal ancillary only   Collection Time: 04/28/21 12:54 PM  Result Value Ref Range   Neisseria Gonorrhea Negative    Chlamydia Negative    Trichomonas Positive (A)    Bacterial Vaginitis (gardnerella) Negative    Candida Vaginitis Positive (A)    Candida Glabrata Negative    Comment      Normal Reference Range Bacterial Vaginosis - Negative   Comment Normal Reference Range Candida Species - Negative    Comment Normal Reference Range Candida Galbrata - Negative    Comment Normal Reference Range Trichomonas - Negative    Comment Normal Reference Ranger Chlamydia - Negative    Comment      Normal Reference Range Neisseria Gonorrhea - Negative   No results found.    Assessment and Plan:   Problem List Items Addressed This Visit      Other   ASC-H Pap smear - Primary    Last pap from almost two years prior ASC-H. Repeat collected today, cervix with benign appearance. Discussed need for possible colposcopy, follow up pending results.       Trichimoniasis    Rx sent. Counseling on importance of partner treatment, abstaining from intercourse for one week following treatment, and returning for TOC in 3-4 weeks.  Relevant Medications   metroNIDAZOLE (FLAGYL) 500 MG tablet      Routine preventative health maintenance measures emphasized. Please refer to After Visit Summary for other counseling recommendations.   Return if symptoms worsen or fail to improve.    Total face-to-face time with patient: 15 minutes.  Over 50% of encounter was spent on counseling and coordination of care.   Venora Maples, MD/[facility] [employer],  [facility]

## 2021-05-02 NOTE — Assessment & Plan Note (Signed)
Rx sent. Counseling on importance of partner treatment, abstaining from intercourse for one week following treatment, and returning for TOC in 3-4 weeks.

## 2021-05-05 LAB — CYTOLOGY - PAP
Comment: NEGATIVE
Diagnosis: UNDETERMINED — AB
High risk HPV: POSITIVE — AB

## 2021-06-01 ENCOUNTER — Ambulatory Visit (HOSPITAL_COMMUNITY)
Admission: RE | Admit: 2021-06-01 | Discharge: 2021-06-01 | Disposition: A | Discharge status: Home / Self Care | Payer: Medicaid Other | Source: Ambulatory Visit | Attending: Urgent Care | Admitting: Urgent Care

## 2021-06-01 ENCOUNTER — Other Ambulatory Visit: Payer: Self-pay

## 2021-06-01 ENCOUNTER — Encounter (HOSPITAL_COMMUNITY): Payer: Self-pay

## 2021-06-01 VITALS — BP 112/76 | HR 87 | Temp 98.7°F | Resp 17

## 2021-06-01 DIAGNOSIS — R102 Pelvic and perineal pain: Secondary | ICD-10-CM | POA: Diagnosis not present

## 2021-06-01 DIAGNOSIS — Z8619 Personal history of other infectious and parasitic diseases: Secondary | ICD-10-CM | POA: Insufficient documentation

## 2021-06-01 LAB — HIV ANTIBODY (ROUTINE TESTING W REFLEX): HIV Screen 4th Generation wRfx: NONREACTIVE

## 2021-06-01 LAB — POC URINE PREG, ED: Preg Test, Ur: NEGATIVE

## 2021-06-01 MED ORDER — METRONIDAZOLE 0.75 % VA GEL: 1.0000 | Freq: Two times a day (BID) | VAGINAL | 0 refills | Status: DC

## 2021-06-01 NOTE — ED Provider Notes (Signed)
[facility]   MRN: [id number] DOB: October 27, 1996  Subjective:   Cassie Nguyen is a 25 y.o. female presenting for STI testing. Reports that she has a history of trichomoniasis, bacterial vaginosis and yeast infection.  She primarily wants to make sure that she does not have any of these but wants to be covered for BV.  Reports that she has some mild intermittent pelvic pains after she has sex, slight vaginal discharge.  Denies fever, nausea, vomiting, active abdominal pelvic pain, dysuria, hematuria, genital rash.  Patient does have a gynecologist at the [facility].  She wanted review of some of her labs.  Patient does still have the same sex partner.  Would like to make sure she gets tested for HIV and syphilis.  Denies taking chronic medications.    Allergies  Allergen Reactions  . Peanut-Containing Drug Products Anaphylaxis  . Shrimp [Shellfish Allergy] Anaphylaxis    Makes throat itchy and dry  . Watermelon Flavor Anaphylaxis  . Lactase Other (See Comments)    Constipation, gi upset    Past Medical History:  Diagnosis Date  . Asthma      No past surgical history on file.  Family History  Problem Relation Age of Onset  . Cancer Mother     Social History   Tobacco Use  . Smoking status: Never Smoker  . Smokeless tobacco: Never Used  Vaping Use  . Vaping Use: Never used  Substance Use Topics  . Alcohol use: Yes    Comment: occ  . Drug use: Yes    Types: Marijuana    Comment: occ    ROS   Objective:   Vitals: BP 112/76 (BP Location: Right Arm)   Pulse 87   Temp 98.7 F (37.1 C) (Oral)   Resp 17   LMP 05/13/2021 (Exact Date)   SpO2 99%   Physical Exam Constitutional:      General: She is not in acute distress.    Appearance: Normal appearance. She is well-developed. She is not ill-appearing, toxic-appearing or diaphoretic.  HENT:     Head: Normocephalic and atraumatic.     Nose: Nose normal.     Mouth/Throat:      Mouth: Mucous membranes are moist.     Pharynx: Oropharynx is clear.  Eyes:     General: No scleral icterus.    Extraocular Movements: Extraocular movements intact.     Pupils: Pupils are equal, round, and reactive to light.  Cardiovascular:     Rate and Rhythm: Normal rate.  Pulmonary:     Effort: Pulmonary effort is normal.  Skin:    General: Skin is warm and dry.  Neurological:     General: No focal deficit present.     Mental Status: She is alert and oriented to person, place, and time.  Psychiatric:        Mood and Affect: Mood normal.        Behavior: Behavior normal.     Results for orders placed or performed during the hospital encounter of 06/01/21 (from the past 24 hour(s))  POC urine pregnancy     Status: None   Collection Time: 06/01/21  1:43 PM  Result Value Ref Range   Preg Test, Ur NEGATIVE NEGATIVE    Assessment and Plan :   PDMP not reviewed this encounter.  1. Pelvic pain in female   2. History of trichomoniasis     We will treat empirically for recurrent bacterial vaginosis which  could also cover for trichomoniasis using MetroGel.  Otherwise labs pending, encouraged abstaining until she completes her medication regimen.  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 Counseled patient on potential for adverse effects with medications prescribed/recommended today, ER and return-to-clinic precautions discussed, patient verbalized understanding.    Wallis Bamberg, PA-C 06/01/21 1352

## 2021-06-01 NOTE — ED Triage Notes (Signed)
Pt in requesting STD testing  Pt states she is not having any sxs

## 2021-06-02 ENCOUNTER — Telehealth (HOSPITAL_COMMUNITY): Payer: Self-pay | Admitting: Emergency Medicine

## 2021-06-02 LAB — CERVICOVAGINAL ANCILLARY ONLY
Bacterial Vaginitis (gardnerella): NEGATIVE
Candida Glabrata: NEGATIVE
Candida Vaginitis: POSITIVE — AB
Chlamydia: NEGATIVE
Comment: NEGATIVE
Comment: NEGATIVE
Comment: NEGATIVE
Comment: NEGATIVE
Comment: NEGATIVE
Comment: NORMAL
Neisseria Gonorrhea: NEGATIVE
Trichomonas: NEGATIVE

## 2021-06-02 LAB — RPR: RPR Ser Ql: NONREACTIVE

## 2021-06-02 MED ORDER — FLUCONAZOLE 150 MG PO TABS: 150.0000 mg | ORAL_TABLET | Freq: Once | ORAL | 0 refills | Status: AC

## 2021-07-09 ENCOUNTER — Other Ambulatory Visit: Payer: Self-pay

## 2021-07-09 ENCOUNTER — Ambulatory Visit (HOSPITAL_COMMUNITY): Payer: Self-pay

## 2021-07-09 ENCOUNTER — Ambulatory Visit (HOSPITAL_COMMUNITY)
Admission: EM | Admit: 2021-07-09 | Discharge: 2021-07-09 | Disposition: A | Discharge status: Home / Self Care | Payer: Medicaid Other | Attending: Emergency Medicine | Admitting: Emergency Medicine

## 2021-07-09 ENCOUNTER — Encounter (HOSPITAL_COMMUNITY): Payer: Self-pay

## 2021-07-09 DIAGNOSIS — Z113 Encounter for screening for infections with a predominantly sexual mode of transmission: Secondary | ICD-10-CM | POA: Diagnosis not present

## 2021-07-09 NOTE — Discharge Instructions (Addendum)
We will contact you with the results from your lab work and any additional treatment.    Call to get an appointment with either the [facility] or [facility] for further evaluation.    Do not have sex while taking undergoing treatment for STI.  Make sure that all of your partners get tested and treated.   Use a condom or other barrier method for all sexual encounters.    Return or go to the Emergency Department if symptoms worsen or do not improve in the next few days.

## 2021-07-09 NOTE — ED Provider Notes (Signed)
[facility]   [id number] 07/09/21 Arrival Time: 1630   CC: CONCERN FOR STD  SUBJECTIVE:  Cassie Nguyen is a 25 y.o. female who presents requesting STI screening.  Currently asymptomatic.  Denies any known contacts with any STI positive partners.  Reports history of BV, trichomonas, and yeast.  Denies fever, chills, nausea, vomiting, abdominal or pelvic pain, urinary symptoms, vaginal itching, vaginal odor, vaginal bleeding, dyspareunia, vaginal rashes or lesions.    Patient's last menstrual period was 06/16/2021.  ROS: As per HPI.  All other pertinent ROS negative.     Past Medical History:  Diagnosis Date   Asthma    History reviewed. No pertinent surgical history. Allergies  Allergen Reactions   Peanut-Containing Drug Products Anaphylaxis   Shrimp [Shellfish Allergy] Anaphylaxis    Makes throat itchy and dry   Watermelon Flavor Anaphylaxis   Lactase Other (See Comments)    Constipation, gi upset   No current facility-administered medications on file prior to encounter.   Current Outpatient Medications on File Prior to Encounter  Medication Sig Dispense Refill   metroNIDAZOLE (METROGEL VAGINAL) 0.75 % vaginal gel Place 1 Applicatorful vaginally 2 (two) times daily. 70 g 0   Prenatal MV & Min w/FA-DHA (PRENATAL GUMMIES PO) Take 1 tablet by mouth daily.     [DISCONTINUED] medroxyPROGESTERone (PROVERA) 10 MG tablet Take 1 tablet (10 mg total) by mouth daily. (Patient not taking: Reported on 07/20/2019) 10 tablet 0   Social History   Socioeconomic History   Marital status: Single    Spouse name: Not on file   Number of children: Not on file   Years of education: Not on file   Highest education level: Not on file  Occupational History   Not on file  Tobacco Use   Smoking status: Never   Smokeless tobacco: Never  Vaping Use   Vaping Use: Never used  Substance and Sexual Activity   Alcohol use: Yes    Comment: occ   Drug use: Yes    Types:  Marijuana    Comment: occ   Sexual activity: Yes  Other Topics Concern   Not on file  Social History Narrative   Not on file   Social Determinants of Health   Financial Resource Strain: Not on file  Food Insecurity: No Food Insecurity   Worried About Running Out of Food in the Last Year: Never true   Ran Out of Food in the Last Year: Never true  Transportation Needs: No Transportation Needs   Lack of Transportation (Medical): No   Lack of Transportation (Non-Medical): No  Physical Activity: Not on file  Stress: Not on file  Social Connections: Not on file  Intimate Partner Violence: Not on file   Family History  Problem Relation Age of Onset   Cancer Mother     OBJECTIVE:  Vitals:   07/09/21 1708  BP: 105/71  Pulse: 72  Resp: 18  Temp: 98.2 F (36.8 C)  TempSrc: Oral  SpO2: 97%     General appearance: alert, NAD, appears stated age Head: NCAT Throat: lips, mucosa, and tongue normal; teeth and gums normal Lungs: CTA bilaterally without adventitious breath sounds Heart: regular rate and rhythm.  Radial pulses 2+ symmetrical bilaterally Back: no CVA tenderness Abdomen: soft, non-tender; bowel sounds normal; no masses or organomegaly; no guarding or rebound tenderness GU: deferred Skin: warm and dry Psychological:  Alert and cooperative. Normal mood and affect.  LABS:  Results for orders placed or performed during the  hospital encounter of 06/01/21  HIV antibody  Result Value Ref Range   HIV Screen 4th Generation wRfx Non Reactive Non Reactive  RPR  Result Value Ref Range   RPR Ser Ql NON REACTIVE NON REACTIVE  POC urine pregnancy  Result Value Ref Range   Preg Test, Ur NEGATIVE NEGATIVE  Cervicovaginal ancillary only  Result Value Ref Range   Neisseria Gonorrhea Negative    Chlamydia Negative    Trichomonas Negative    Bacterial Vaginitis (gardnerella) Negative    Candida Vaginitis Positive (A)    Candida Glabrata Negative    Comment      Normal  Reference Range Bacterial Vaginosis - Negative   Comment Normal Reference Range Candida Species - Negative    Comment Normal Reference Range Candida Galbrata - Negative    Comment Normal Reference Range Trichomonas - Negative    Comment Normal Reference Ranger Chlamydia - Negative    Comment      Normal Reference Range Neisseria Gonorrhea - Negative    Labs Reviewed  CERVICOVAGINAL ANCILLARY ONLY    ASSESSMENT & PLAN:  1. Screen for STD (sexually transmitted disease)     No orders of the defined types were placed in this encounter.   Pending: Labs Reviewed  CERVICOVAGINAL ANCILLARY ONLY    Vaginal self swab obtained, will treat based on results.  Declines HIV/ syphilis testing today We will follow up with you regarding the results of your test If tests are positive, please abstain from sexual activity until you and your partner(s) are treated Follow up with PCP or [facility] if symptoms persists.  Given follow up information for GYN.  Return here or go to ER if you have any new or worsening symptoms    Reviewed expectations re: course of current medical issues. Questions answered. Outlined signs and symptoms indicating need for more acute intervention. Patient verbalized understanding. After Visit Summary given.        Ivette Loyal, NP 07/09/21 1739

## 2021-07-09 NOTE — ED Triage Notes (Signed)
Pt presents for full STD testing with no known symptoms.  

## 2021-07-10 LAB — CERVICOVAGINAL ANCILLARY ONLY
Bacterial Vaginitis (gardnerella): NEGATIVE
Candida Glabrata: NEGATIVE
Candida Vaginitis: POSITIVE — AB
Chlamydia: NEGATIVE
Comment: NEGATIVE
Comment: NEGATIVE
Comment: NEGATIVE
Comment: NEGATIVE
Comment: NEGATIVE
Comment: NORMAL
Neisseria Gonorrhea: NEGATIVE
Trichomonas: POSITIVE — AB

## 2021-07-13 ENCOUNTER — Telehealth (HOSPITAL_COMMUNITY): Payer: Self-pay | Admitting: Emergency Medicine

## 2021-07-13 MED ORDER — FLUCONAZOLE 150 MG PO TABS: 150.0000 mg | ORAL_TABLET | Freq: Once | ORAL | 0 refills | Status: AC

## 2021-07-13 MED ORDER — METRONIDAZOLE 500 MG PO TABS: 500.0000 mg | ORAL_TABLET | Freq: Two times a day (BID) | ORAL | 0 refills | Status: DC

## 2021-08-30 ENCOUNTER — Other Ambulatory Visit: Payer: Self-pay

## 2021-08-30 ENCOUNTER — Ambulatory Visit (HOSPITAL_COMMUNITY)
Admission: EM | Admit: 2021-08-30 | Discharge: 2021-08-30 | Disposition: A | Discharge status: Home / Self Care | Payer: Medicaid Other

## 2021-08-31 ENCOUNTER — Encounter (HOSPITAL_COMMUNITY): Payer: Self-pay

## 2021-08-31 ENCOUNTER — Ambulatory Visit (HOSPITAL_COMMUNITY)
Admission: EM | Admit: 2021-08-31 | Discharge: 2021-08-31 | Disposition: A | Discharge status: Home / Self Care | Payer: Medicaid Other | Attending: Physician Assistant | Admitting: Physician Assistant

## 2021-08-31 VITALS — BP 113/72 | HR 89 | Temp 98.7°F | Resp 16

## 2021-08-31 DIAGNOSIS — Z113 Encounter for screening for infections with a predominantly sexual mode of transmission: Secondary | ICD-10-CM

## 2021-08-31 NOTE — Discharge Instructions (Addendum)
Will notify of results once available, Follow up with any further concerns.

## 2021-08-31 NOTE — ED Triage Notes (Signed)
Pt presents for STD's test. Pt reports having a bad vaginal odor.

## 2021-08-31 NOTE — ED Provider Notes (Signed)
[facility]    CSN: [id number] Arrival date & time: 08/31/21  1854      History   Chief Complaint Chief Complaint  Patient presents with   Appointment    1900   STD's test    HPI Cassie Nguyen is a 25 y.o. female.   Patient presents today for STD screening. She denies any symptoms. She has not had any vaginal discharge, pelvic pain, abdominal pain, or other symptoms. She does admit to some odor vaginally with intercourse but not otherwise.     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 No pertinent surgical history.  OB History     Gravida  0   Para  0   Term  0   Preterm  0   AB  0   Living  0      SAB  0   IAB  0   Ectopic  0   Multiple  0   Live Births  0            Home Medications    Prior to Admission medications   Medication Sig Start Date End Date Taking? Authorizing Provider  metroNIDAZOLE (FLAGYL) 500 MG tablet Take 1 tablet (500 mg total) by mouth 2 (two) times daily. 07/13/21   Lamptey, Britta Mccreedy, MD  metroNIDAZOLE (METROGEL VAGINAL) 0.75 % vaginal gel Place 1 Applicatorful vaginally 2 (two) times daily. 06/01/21   Wallis Bamberg, PA-C  Prenatal MV & Min w/FA-DHA (PRENATAL GUMMIES PO) Take 1 tablet by mouth daily.    [provider]  medroxyPROGESTERone (PROVERA) 10 MG tablet Take 1 tablet (10 mg total) by mouth daily. Patient not taking: Reported on 07/20/2019 06/25/19 02/26/20  Merrilee Jansky, MD    Family History Family History  Problem Relation Age of Onset   Cancer Mother     Social History Social History   Tobacco Use   Smoking status: Never   Smokeless tobacco: Never  Vaping Use   Vaping Use: Never  used  Substance Use Topics   Alcohol use: Yes    Comment: occ   Drug use: Yes    Types: Marijuana    Comment: occ     Allergies   Peanut-containing drug products, Shrimp [shellfish allergy], Watermelon flavor, and Lactase   Review of Systems Review of Systems  Constitutional:  Negative for chills and fever.  Eyes:  Negative for discharge and redness.  Respiratory:  Negative for shortness of breath.   Gastrointestinal:  Negative for abdominal pain, nausea and vomiting.  Genitourinary:  Negative for pelvic pain and vaginal discharge.    Physical Exam Triage Vital Signs ED Triage Vitals  Enc Vitals Group     BP 08/31/21 1930 113/72     Pulse Rate 08/31/21 1930 89     Resp 08/31/21 1930 16     Temp 08/31/21 1930 98.7 F (37.1 C)     Temp Source 08/31/21 1930 Oral     SpO2 08/31/21 1930 99 %     Weight --      Height --      Head Circumference --      Peak Flow --  Pain Score 08/31/21 1929 0     Pain Loc --      Pain Edu? --      Excl. in GC? --    No data found.  Updated Vital Signs BP 113/72 (BP Location: Left Arm)   Pulse 89   Temp 98.7 F (37.1 C) (Oral)   Resp 16   SpO2 99%      Physical Exam Vitals and nursing note reviewed.  Constitutional:      General: She is not in acute distress.    Appearance: Normal appearance. She is not ill-appearing.  HENT:     Head: Normocephalic and atraumatic.     Nose: Nose normal.  Cardiovascular:     Rate and Rhythm: Normal rate.  Pulmonary:     Effort: Pulmonary effort is normal.  Neurological:     Mental Status: She is alert.     UC Treatments / Results  Labs (all labs ordered are listed, but only abnormal results are displayed) Labs Reviewed  CERVICOVAGINAL ANCILLARY ONLY    EKG   Radiology No results found.  Procedures Procedures (including critical care time)  Medications Ordered in UC Medications - No data to display  Initial Impression / Assessment and Plan / UC Course  I have  reviewed the triage vital signs and the nursing notes.  Pertinent labs & imaging results that were available during my care of the patient were reviewed by me and considered in my medical decision making (see chart for details).   STD screening ordered as requested. Will notify of results once available. Recommend follow up with any concerns.   Final Clinical Impressions(s) / UC Diagnoses   Final diagnoses:  Screen for STD (sexually transmitted disease)     Discharge Instructions      Will notify of results once available, Follow up with any further concerns.      ED Prescriptions   None    PDMP not reviewed this encounter.   Tomi Bamberger, PA-C 08/31/21 1954

## 2021-09-01 ENCOUNTER — Telehealth (HOSPITAL_COMMUNITY): Payer: Self-pay | Admitting: Emergency Medicine

## 2021-09-01 LAB — CERVICOVAGINAL ANCILLARY ONLY
Bacterial Vaginitis (gardnerella): POSITIVE — AB
Candida Glabrata: NEGATIVE
Candida Vaginitis: POSITIVE — AB
Chlamydia: NEGATIVE
Comment: NEGATIVE
Comment: NEGATIVE
Comment: NEGATIVE
Comment: NEGATIVE
Comment: NEGATIVE
Comment: NORMAL
Neisseria Gonorrhea: NEGATIVE
Trichomonas: NEGATIVE

## 2021-09-01 MED ORDER — METRONIDAZOLE 0.75 % VA GEL: 1.0000 | Freq: Every day | VAGINAL | 0 refills | Status: AC

## 2021-09-01 MED ORDER — FLUCONAZOLE 150 MG PO TABS: 150.0000 mg | ORAL_TABLET | Freq: Once | ORAL | 0 refills | Status: AC

## 2021-09-13 ENCOUNTER — Ambulatory Visit (HOSPITAL_COMMUNITY)
Admission: EM | Admit: 2021-09-13 | Discharge: 2021-09-13 | Disposition: A | Discharge status: Home / Self Care | Payer: Medicaid Other | Attending: Medical Oncology | Admitting: Medical Oncology

## 2021-09-13 ENCOUNTER — Encounter (HOSPITAL_COMMUNITY): Payer: Self-pay

## 2021-09-13 ENCOUNTER — Other Ambulatory Visit: Payer: Self-pay

## 2021-09-13 DIAGNOSIS — Z3201 Encounter for pregnancy test, result positive: Secondary | ICD-10-CM | POA: Diagnosis not present

## 2021-09-13 LAB — POC URINE PREG, ED: Preg Test, Ur: POSITIVE — AB

## 2021-09-13 NOTE — ED Provider Notes (Signed)
[facility]    CSN: [id number] Arrival date & time: 09/13/21  1601      History   Chief Complaint Chief Complaint  Patient presents with  . Possible Pregnancy    HPI Cassie Nguyen is a 25 y.o. female.   HPI  Amenorrhea: Patient reports that her last menstrual cycle was at the end of July.  She reports that this week she took a home pregnancy test which was positive.  This is her first pregnancy.  She reports that she is planning on keeping this pregnancy.  She has not yet started a prenatal vitamin.  She does not smoke tobacco but does smoke marijuana on a fairly regular basis but states that she is going to stop for this pregnancy.  She reports that she drinks socially and her last drink occurred this past weekend before she knew she was pregnant.  She denies any significant abdominal pain or pelvic bleeding.   Past Medical History:  Diagnosis Date  . Asthma     Patient Active Problem List   Diagnosis Date Noted  . ASC-H Pap smear 05/02/2021  . Trichimoniasis 05/02/2021  . Cervical dysplasia 07/27/2019  . Abnormal uterine bleeding (AUB) 07/20/2019  . 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 No pertinent surgical history.  OB History     Gravida  0   Para  0   Term  0   Preterm  0   AB  0   Living  0      SAB  0   IAB  0   Ectopic  0   Multiple  0   Live Births  0            Home Medications    Prior to Admission medications   Medication Sig Start Date End Date Taking? Authorizing Provider  Prenatal MV & Min w/FA-DHA (PRENATAL GUMMIES PO) Take 1 tablet by mouth daily.    [provider]  medroxyPROGESTERone (PROVERA) 10 MG tablet Take 1 tablet (10 mg total) by mouth daily. Patient not taking: Reported on 07/20/2019 06/25/19 02/26/20  Merrilee Jansky, MD    Family History Family History  Problem Relation Age of Onset  . Cancer Mother     Social  History Social History   Tobacco Use  . Smoking status: Never  . Smokeless tobacco: Never  Vaping Use  . Vaping Use: Never used  Substance Use Topics  . Alcohol use: Yes    Comment: occ  . Drug use: Yes    Types: Marijuana    Comment: occ     Allergies   Peanut-containing drug products, Shrimp [shellfish allergy], Watermelon flavor, and Lactase   Review of Systems Review of Systems  As stated above in HPI Physical Exam Triage Vital Signs ED Triage Vitals  Enc Vitals Group     BP 09/13/21 1006 107/70     Pulse Rate 09/13/21 1006 88     Resp 09/13/21 1006 18     Temp 09/13/21 1006 98.6 F (37 C)     Temp Source 09/13/21 1006 Oral     SpO2 09/13/21 1006 97 %     Weight --      Height --      Head Circumference --      Peak Flow --      Pain Score 09/13/21 1005 0     Pain Loc --      Pain  Edu? --      Excl. in GC? --    No data found.  Updated Vital Signs BP 107/70 (BP Location: Left Arm)   Pulse 88   Temp 98.6 F (37 C) (Oral)   Resp 18   LMP 07/29/2021   SpO2 97%   Physical Exam Vitals and nursing note reviewed.  Constitutional:      General: She is not in acute distress.    Appearance: Normal appearance. She is not ill-appearing, toxic-appearing or diaphoretic.  Neurological:     Mental Status: She is alert.     UC Treatments / Results  Labs (all labs ordered are listed, but only abnormal results are displayed) Labs Reviewed  POC URINE PREG, ED - Abnormal; Notable for the following components:      Result Value   Preg Test, Ur POSITIVE (*)    All other components within normal limits    EKG   Radiology No results found.  Procedures Procedures (including critical care time)  Medications Ordered in UC Medications - No data to display  Initial Impression / Assessment and Plan / UC Course  I have reviewed the triage vital signs and the nursing notes.  Pertinent labs & imaging results that were available during my care of the  patient were reviewed by me and considered in my medical decision making (see chart for details).     New.  Discussed her positive in office pregnancy test.  Discussed the need for follow-up with OB/GYN and I have included a number to a local women Center for patient.  For now we discussed the importance of starting a prenatal vitamin and we discussed general first trimester of pregnancy items such as avoiding raw foods, avoiding cleaning out cat litter boxes, avoiding alcohol and tobacco.  We discussed that she should check with OB/GYN before starting any new medications.  We reviewed red flag signs and symptoms.  Follow-up as needed. Final Clinical Impressions(s) / UC Diagnoses   Final diagnoses:  None   Discharge Instructions   None    ED Prescriptions   None    PDMP not reviewed this encounter.   Rushie Chestnut, [place] 09/13/21 1116

## 2021-09-13 NOTE — ED Triage Notes (Signed)
Pt presents for possible pregnancy. 

## 2021-09-13 NOTE — Discharge Instructions (Addendum)
Make sure to start a prenatal vitamin today if you have not already done so

## 2021-09-13 NOTE — ED Triage Notes (Signed)
Pt called once in lobby with no answer ° °

## 2021-09-15 ENCOUNTER — Inpatient Hospital Stay (HOSPITAL_COMMUNITY)
Admission: EM | Admit: 2021-09-15 | Discharge: 2021-09-15 | Disposition: A | Discharge status: Home / Self Care | Payer: Medicaid Other | Attending: Obstetrics and Gynecology | Admitting: Obstetrics and Gynecology

## 2021-09-15 ENCOUNTER — Inpatient Hospital Stay (HOSPITAL_COMMUNITY): Payer: Medicaid Other

## 2021-09-15 ENCOUNTER — Other Ambulatory Visit: Payer: Self-pay

## 2021-09-15 ENCOUNTER — Encounter (HOSPITAL_COMMUNITY): Payer: Self-pay | Admitting: Obstetrics and Gynecology

## 2021-09-15 DIAGNOSIS — R109 Unspecified abdominal pain: Secondary | ICD-10-CM | POA: Diagnosis not present

## 2021-09-15 DIAGNOSIS — N83201 Unspecified ovarian cyst, right side: Secondary | ICD-10-CM | POA: Diagnosis not present

## 2021-09-15 DIAGNOSIS — Z3A01 Less than 8 weeks gestation of pregnancy: Secondary | ICD-10-CM | POA: Diagnosis not present

## 2021-09-15 DIAGNOSIS — O26891 Other specified pregnancy related conditions, first trimester: Secondary | ICD-10-CM | POA: Insufficient documentation

## 2021-09-15 DIAGNOSIS — O3680X0 Pregnancy with inconclusive fetal viability, not applicable or unspecified: Secondary | ICD-10-CM | POA: Insufficient documentation

## 2021-09-15 DIAGNOSIS — O26899 Other specified pregnancy related conditions, unspecified trimester: Secondary | ICD-10-CM

## 2021-09-15 HISTORY — DX: Gonococcal infection, unspecified: A54.9

## 2021-09-15 HISTORY — DX: Trichomoniasis, unspecified: A59.9

## 2021-09-15 HISTORY — DX: Chlamydial infection, unspecified: A74.9

## 2021-09-15 LAB — CBC
HCT: 38.4 % (ref 36.0–46.0)
Hemoglobin: 13.5 g/dL (ref 12.0–15.0)
MCH: 31.6 pg (ref 26.0–34.0)
MCHC: 35.2 g/dL (ref 30.0–36.0)
MCV: 89.9 fL (ref 80.0–100.0)
Platelets: 358 10*3/uL (ref 150–400)
RBC: 4.27 MIL/uL (ref 3.87–5.11)
RDW: 12.6 % (ref 11.5–15.5)
WBC: 4.8 10*3/uL (ref 4.0–10.5)
nRBC: 0 % (ref 0.0–0.2)

## 2021-09-15 LAB — HCG, QUANTITATIVE, PREGNANCY: hCG, Beta Chain, Quant, S: 6371 m[IU]/mL — ABNORMAL HIGH (ref <5)

## 2021-09-15 LAB — URINALYSIS, ROUTINE W REFLEX MICROSCOPIC
Bilirubin Urine: NEGATIVE
Glucose, UA: NEGATIVE mg/dL
Hgb urine dipstick: NEGATIVE
Ketones, ur: NEGATIVE mg/dL
Leukocytes,Ua: NEGATIVE
Nitrite: NEGATIVE
Protein, ur: NEGATIVE mg/dL
Specific Gravity, Urine: 1.014 (ref 1.005–1.030)
pH: 6 (ref 5.0–8.0)

## 2021-09-15 LAB — ABO/RH: ABO/RH(D): O POS

## 2021-09-15 NOTE — ED Notes (Signed)
Unable to locate in lobby. Called several time.

## 2021-09-15 NOTE — MAU Provider Note (Signed)
History     CSN: [id number]  Arrival date and time: 09/15/21 1020   Event Date/Time   First Provider Initiated Contact with Patient 09/15/21 1525      Chief Complaint  Patient presents with  . Pregnancy Ultrasound   HPI Cassie Nguyen is a 25 y.o. G1P0000 at [redacted]w[redacted]d who presents with abdominal cramping. Reports intermittent abdominal cramping for the last 2 weeks that has worsened in the last 3 days. Pain is throughout lower abdomen. Rates pain 5/10. Took 200mg  of ibuprofen with some relief. Has some intermittent nausea but no vomiting. Denies dysuria, vaginal bleeding, or vaginal discharge.   OB History     Gravida  1   Para  0   Term  0   Preterm  0   AB  0   Living  0      SAB  0   IAB  0   Ectopic  0   Multiple  0   Live Births  0           Past Medical History:  Diagnosis Date  . Asthma   . Chlamydia   . Gonorrhea   . Trichomonas infection     Past Surgical History:  Procedure Laterality Date  . ROOT CANAL      Family History  Problem Relation Age of Onset  . Cancer Mother     Social History   Tobacco Use  . Smoking status: Never  . Smokeless tobacco: Never  Vaping Use  . Vaping Use: Never used  Substance Use Topics  . Alcohol use: Not Currently    Comment: occ, none since found out  . Drug use: Not Currently    Frequency: 3.0 times per week    Types: Marijuana    Comment: not since found out preg    Allergies:  Allergies  Allergen Reactions  . Peanut-Containing Drug Products Anaphylaxis  . Shrimp [Shellfish Allergy] Anaphylaxis    Makes throat itchy and dry  . Watermelon Flavor Anaphylaxis  . Lactase Other (See Comments)    Constipation, gi upset    Medications Prior to Admission  Medication Sig Dispense Refill Last Dose  . Prenatal MV & Min w/FA-DHA (PRENATAL GUMMIES PO) Take 1 tablet by mouth daily.   09/15/2021    Review of Systems  Constitutional: Negative.   Gastrointestinal:  Positive for abdominal  pain and nausea. Negative for diarrhea and vomiting.  Genitourinary: Negative.   Physical Exam   Blood pressure 118/81, pulse 82, resp. rate 16, height 5\' 3"  (1.6 m), weight 55.4 kg, last menstrual period 07/29/2021, SpO2 100 %.  Physical Exam Vitals and nursing note reviewed.  Constitutional:      Appearance: Normal appearance. She is not ill-appearing.  HENT:     Head: Normocephalic and atraumatic.  Eyes:     General: No scleral icterus. Pulmonary:     Effort: Pulmonary effort is normal. No respiratory distress.  Abdominal:     General: Abdomen is flat.     Palpations: Abdomen is soft.     Tenderness: There is no abdominal tenderness.  Skin:    General: Skin is warm and dry.  Neurological:     Mental Status: She is alert.  Psychiatric:        Mood and Affect: Mood normal.        Behavior: Behavior normal.    MAU Course  Procedures Results for orders placed or performed during the hospital encounter of 09/15/21 (from the past 24  hour(s))  Urinalysis, Routine w reflex microscopic Urine, Clean Catch     Status: Abnormal   Collection Time: 09/15/21  3:11 PM  Result Value Ref Range   Color, Urine YELLOW YELLOW   APPearance HAZY (A) CLEAR   Specific Gravity, Urine 1.014 1.005 - 1.030   pH 6.0 5.0 - 8.0   Glucose, UA NEGATIVE NEGATIVE mg/dL   Hgb urine dipstick NEGATIVE NEGATIVE   Bilirubin Urine NEGATIVE NEGATIVE   Ketones, ur NEGATIVE NEGATIVE mg/dL   Protein, ur NEGATIVE NEGATIVE mg/dL   Nitrite NEGATIVE NEGATIVE   Leukocytes,Ua NEGATIVE NEGATIVE  CBC     Status: None   Collection Time: 09/15/21  3:42 PM  Result Value Ref Range   WBC 4.8 4.0 - 10.5 K/uL   RBC 4.27 3.87 - 5.11 MIL/uL   Hemoglobin 13.5 12.0 - 15.0 g/dL   HCT 40.9 81.1 - 91.4 %   MCV 89.9 80.0 - 100.0 fL   MCH 31.6 26.0 - 34.0 pg   MCHC 35.2 30.0 - 36.0 g/dL   RDW 78.2 95.6 - 21.3 %   Platelets 358 150 - 400 K/uL   nRBC 0.0 0.0 - 0.2 %  ABO/Rh     Status: None   Collection Time: 09/15/21  3:42  PM  Result Value Ref Range   ABO/RH(D)      O POS Performed at [facility], [address] [place], [place] [address]   hCG, quantitative, pregnancy     Status: Abnormal   Collection Time: 09/15/21  3:42 PM  Result Value Ref Range   hCG, Beta Chain, Quant, S 6,371 (H) <5 mIU/mL   US OB LESS THAN 14 WEEKS WITH OB TRANSVAGINAL  Result Date: 09/15/2021 CLINICAL DATA:  Abdominal cramping.  Early pregnancy. EXAM: OBSTETRIC <14 WK [place] AND TRANSVAGINAL OB US TECHNIQUE: Both transabdominal and transvaginal ultrasound examinations were performed for complete evaluation of the gestation as well as the maternal uterus, adnexal regions, and pelvic cul-de-sac. Transvaginal technique was performed to assess early pregnancy. COMPARISON:  None. FINDINGS: Intrauterine gestational sac: Present Yolk sac:  Absent Embryo:  Absent Cardiac Activity: N/A MSD: 4.7 mm   5 w   1 d Subchorionic hemorrhage:  None visualized. Maternal uterus/adnexae: Retroverted orientation of the uterus (considered normal in the first trimester, but abnormal if this persists substantially into the second trimester). Mildly complex 1.6 cm lesion of the left ovary is likely a corpus luteum of pregnancy, with the left ovary measuring 4.7 by 2.5 by 3.7 cm. There is also a 1.1 by 0.6 cm mildly complex cystic lesion of the right ovary. The right ovary measures 2.0 by 1.2 by 1.7 cm. IMPRESSION: 1. Single intrauterine gestational sac without visible embryo or yolk sac at this time. Probable early intrauterine gestational sac, but no yolk sac, fetal pole, or cardiac activity yet visualized. Recommend follow-up quantitative B-HCG levels and follow-up US in 14 days to assess viability. This recommendation follows SRU consensus guidelines: Diagnostic Criteria for Nonviable Pregnancy Early in the First Trimester. Malva Limes Med 2013; 846:9629-52. Electronically Signed   By: Gaylyn Rong M.D.   On: 09/15/2021 18:27    MDM +UPT UA, CBC, ABO/Rh,  quant hCG, and [place] today to rule out ectopic pregnancy which can be life threatening.  Benign abdominal exam.   Patient had vaginal swabs less than 2 weeks ago - not collected today.   Ultrasound shows empty ?IUGS & bilateral ovarian cysts. HCG is 6371. Reviewed with Dr. Vergie Living who  recommends repeat HCG & ultrasound on Sunday.   Assessment and Plan   1. Pregnancy of unknown anatomic location   2. Abdominal cramping affecting pregnancy    -reviewed ectopic & SAB precautions -return to MAU on Sunday for repeat HCG & ultrasound  Cassie Nguyen 09/15/2021, 3:25 PM

## 2021-09-15 NOTE — MAU Note (Signed)
Is 6 or 7 wks preg.  At first she didn't know, so she was still drinking and stuff. Has been cramping, wants to make sure everything is ok.  Denies any bleeding.  Has been eating a lot of fish, concerned about that too.

## 2021-09-15 NOTE — ED Provider Notes (Signed)
Emergency Medicine Provider Triage Evaluation Note  Diania Co , a 25 y.o. female  was evaluated in triage.  Pt complains of 6 [redacted] weeks pregnant.  Also endorsing some intermittent abdominal cramping.  No vaginal bleeding. Wants ultrasound.   Review of Systems  Positive: Ultrasound, abdominal cramping Negative: Vaginal bleeding  Physical Exam  BP 120/79   Pulse 93   Resp 17   SpO2 100%  Gen:   Awake, no distress   Resp:  Normal effort  MSK:   Moves extremities without difficulty  Other:  No AP  Medical Decision Making  Medically screening exam initiated at 12:56 PM.  Appropriate orders placed.  Brynnan Aseret Hoffman Thurgood was informed that the remainder of the evaluation will be completed by another provider, this initial triage assessment does not replace that evaluation, and the importance of remaining in the ED until their evaluation is complete.  Will send to women's   Theron Arista, PA-C 09/15/21 1257    Wynetta Fines, MD 09/16/21 [phone]

## 2021-09-15 NOTE — Discharge Instructions (Signed)
Return to care  If you have heavier bleeding that soaks through more than 2 pads per hour for an hour or more If you bleed so much that you feel like you might pass out or you do pass out If you have significant abdominal pain that is not improved with Tylenol   

## 2021-09-15 NOTE — ED Triage Notes (Signed)
Pt requesting ultrasound, pt [redacted] weeks pregnant

## 2021-09-17 ENCOUNTER — Other Ambulatory Visit: Payer: Self-pay

## 2021-09-17 ENCOUNTER — Inpatient Hospital Stay (HOSPITAL_COMMUNITY): Payer: Medicaid Other

## 2021-09-17 ENCOUNTER — Inpatient Hospital Stay (HOSPITAL_COMMUNITY)
Admission: AD | Admit: 2021-09-17 | Discharge: 2021-09-17 | Disposition: A | Discharge status: Home / Self Care | Payer: Medicaid Other | Attending: Obstetrics & Gynecology | Admitting: Obstetrics & Gynecology

## 2021-09-17 DIAGNOSIS — Z3A01 Less than 8 weeks gestation of pregnancy: Secondary | ICD-10-CM | POA: Diagnosis not present

## 2021-09-17 DIAGNOSIS — N839 Noninflammatory disorder of ovary, fallopian tube and broad ligament, unspecified: Secondary | ICD-10-CM | POA: Insufficient documentation

## 2021-09-17 DIAGNOSIS — O26891 Other specified pregnancy related conditions, first trimester: Secondary | ICD-10-CM | POA: Diagnosis not present

## 2021-09-17 DIAGNOSIS — Z3491 Encounter for supervision of normal pregnancy, unspecified, first trimester: Secondary | ICD-10-CM | POA: Insufficient documentation

## 2021-09-17 DIAGNOSIS — O26899 Other specified pregnancy related conditions, unspecified trimester: Secondary | ICD-10-CM

## 2021-09-17 DIAGNOSIS — R109 Unspecified abdominal pain: Secondary | ICD-10-CM | POA: Insufficient documentation

## 2021-09-17 DIAGNOSIS — N83291 Other ovarian cyst, right side: Secondary | ICD-10-CM | POA: Diagnosis not present

## 2021-09-17 DIAGNOSIS — O99891 Other specified diseases and conditions complicating pregnancy: Secondary | ICD-10-CM | POA: Diagnosis not present

## 2021-09-17 DIAGNOSIS — R102 Pelvic and perineal pain: Secondary | ICD-10-CM | POA: Diagnosis not present

## 2021-09-17 DIAGNOSIS — N838 Other noninflammatory disorders of ovary, fallopian tube and broad ligament: Secondary | ICD-10-CM

## 2021-09-17 LAB — COMPREHENSIVE METABOLIC PANEL
ALT: 11 U/L (ref 0–44)
AST: 19 U/L (ref 15–41)
Albumin: 4 g/dL (ref 3.5–5.0)
Alkaline Phosphatase: 43 U/L (ref 38–126)
Anion gap: 8 (ref 5–15)
BUN: 8 mg/dL (ref 6–20)
CO2: 23 mmol/L (ref 22–32)
Calcium: 9.7 mg/dL (ref 8.9–10.3)
Chloride: 104 mmol/L (ref 98–111)
Creatinine, Ser: 0.73 mg/dL (ref 0.44–1.00)
GFR, Estimated: >60 mL/min (ref >60)
Glucose, Bld: 105 mg/dL — ABNORMAL HIGH (ref 70–99)
Potassium: 3.7 mmol/L (ref 3.5–5.1)
Sodium: 135 mmol/L (ref 135–145)
Total Bilirubin: 0.8 mg/dL (ref 0.3–1.2)
Total Protein: 7 g/dL (ref 6.5–8.1)

## 2021-09-17 LAB — HCG, QUANTITATIVE, PREGNANCY: hCG, Beta Chain, Quant, S: 9114 m[IU]/mL — ABNORMAL HIGH (ref <5)

## 2021-09-17 NOTE — MAU Provider Note (Signed)
History   Chief Complaint:  Follow-up   Cassie Nguyen is  25 y.o. G1P0000 Patient's last menstrual period was 07/29/2021.Marland Kitchen Patient is here for follow up of quantitative HCG and ongoing surveillance of pregnancy status. She is [redacted]w[redacted]d weeks gestation  by LMP.    Since her last visit, the patient is without new complaint. The patient reports bleeding as  none now.  She denies any pain.  General ROS:  negative  Her previous Quantitative HCG values are:  Component     Latest Ref Rng & Units 09/15/2021  HCG, Beta Chain, Quant, S     <5 mIU/mL 6,371 (H)     Physical Exam   Blood pressure 107/63, pulse 90, temperature 98.9 F (37.2 C), temperature source Oral, resp. rate 16, last menstrual period 07/29/2021, SpO2 100 %.  Physical Examination: General appearance - alert, well appearing, and in no distress Mental status - alert, oriented to person, place, and time Eyes - sclera anicteric Chest - normal respiratory effort  Labs: Results for orders placed or performed during the hospital encounter of 09/17/21 (from the past 24 hour(s))  hCG, quantitative, pregnancy   Collection Time: 09/17/21  5:10 PM  Result Value Ref Range   hCG, Beta Chain, Quant, S 9,114 (H) <5 mIU/mL    Ultrasound Studies:   US OB Transvaginal  Result Date: 09/17/2021 CLINICAL DATA:  Abdominal cramping. EXAM: OBSTETRIC <14 WK ULTRASOUND TECHNIQUE: Transabdominal ultrasound was performed for evaluation of the gestation as well as the maternal uterus and adnexal regions. COMPARISON:  Obstetrical ultrasound 09/15/2021. FINDINGS: Intrauterine gestational sac: Single Yolk sac:  Visualized. Embryo:  Not Visualized. MSD:  6.6 mm   5 w   2 d Subchorionic hemorrhage:  None visualized. Maternal uterus/adnexae: Left ovary: Corpus luteum cyst measuring 2 cm. Otherwise within normal limits. Right ovary: Enlarged measuring 6.4 x 4.5 x 3.5 cm. It contains a smooth bordered, ovoid, homogeneous mildly echogenic mass without  vascularity. Punctate echogenic foci within the mass, likely calcifications. Measures 5.0 x 3.4 x 3.0 cm. IMPRESSION: 1. Single intrauterine gestational sac measuring 5 weeks 2 days by mean gestational sac diameter. A yolk sac is visualized, but embryo is not visualized. Probable early intrauterine gestational sac, but no yolk sac, fetal pole, or cardiac activity yet visualized. Recommend follow-up quantitative B-HCG levels and follow-up US in 14 days to assess viability. This recommendation follows SRU consensus guidelines: Diagnostic Criteria for Nonviable Pregnancy Early in the First Trimester. Malva Limes Med 2013; 914:7829-56. 2. 5.0 cm right ovarian mass, indeterminate. Findings may be related to fibroma thecoma, Brenner tumor or other solid mass. Follow-up imaging with MRI should be considered when clinically appropriate. 3. These results were called by telephone at the time of interpretation on 09/17/2021 at 7:23 pm to provider Judeth Horn , who verbally acknowledged these results. Electronically Signed   By: Darliss Cheney M.D.   On: 09/17/2021 19:24    Assessment:   1. Normal IUP (intrauterine pregnancy) on prenatal ultrasound, first trimester   2. Abdominal cramping affecting pregnancy   3. Ovarian mass, right   4. [redacted] weeks gestation of pregnancy       Plan: -Discharge home in stable condition -SAB & torsion precautions discussed -Viability scan ordered for dating since patient will have new ob appointment in our office next month -Patient may return to MAU as needed or if her condition were to change or worsen  Judeth Horn, NP 09/17/2021, 7:37 PM

## 2021-09-17 NOTE — MAU Note (Signed)
Cassie Nguyen is a 25 y.o. at [redacted]w[redacted]d here in MAU reporting: here for follow up hcg. No bleeding. Having intermittent pain but none right now.  Onset of complaint: ongoing  Pain score: 0/10  Vitals:   09/17/21 1718  BP: 107/63  Pulse: 90  Resp: 16  Temp: 98.9 F (37.2 C)  SpO2: 100%     Lab orders placed from triage: hcg

## 2021-09-27 ENCOUNTER — Ambulatory Visit (INDEPENDENT_AMBULATORY_CARE_PROVIDER_SITE_OTHER): Payer: Medicaid Other

## 2021-09-27 ENCOUNTER — Other Ambulatory Visit: Payer: Self-pay

## 2021-09-27 ENCOUNTER — Ambulatory Visit
Admission: RE | Admit: 2021-09-27 | Discharge: 2021-09-27 | Disposition: A | Discharge status: Home / Self Care | Payer: Medicaid Other | Source: Ambulatory Visit | Attending: Student | Admitting: Student

## 2021-09-27 DIAGNOSIS — Z3A01 Less than 8 weeks gestation of pregnancy: Secondary | ICD-10-CM | POA: Insufficient documentation

## 2021-09-27 DIAGNOSIS — O26899 Other specified pregnancy related conditions, unspecified trimester: Secondary | ICD-10-CM

## 2021-09-27 DIAGNOSIS — O3680X0 Pregnancy with inconclusive fetal viability, not applicable or unspecified: Secondary | ICD-10-CM | POA: Diagnosis not present

## 2021-09-27 DIAGNOSIS — N838 Other noninflammatory disorders of ovary, fallopian tube and broad ligament: Secondary | ICD-10-CM | POA: Diagnosis present

## 2021-09-27 DIAGNOSIS — Z3491 Encounter for supervision of normal pregnancy, unspecified, first trimester: Secondary | ICD-10-CM | POA: Insufficient documentation

## 2021-09-27 DIAGNOSIS — R109 Unspecified abdominal pain: Secondary | ICD-10-CM

## 2021-09-27 DIAGNOSIS — N858 Other specified noninflammatory disorders of uterus: Secondary | ICD-10-CM | POA: Diagnosis not present

## 2021-09-27 NOTE — Progress Notes (Signed)
Results reviewed with Dr Macon Large. Pt advised to have repeat US in 7-10 days. Pt given results and recommendations per Dr Macon Large. Pt verbalized understanding and agreeable to plan of care.  Ectopic and bleeding precautions reviewed with pt.  [place] 10/12@ 10 am.  Judeth Cornfield, RN

## 2021-09-28 ENCOUNTER — Encounter (HOSPITAL_COMMUNITY): Payer: Self-pay | Admitting: Obstetrics & Gynecology

## 2021-09-28 ENCOUNTER — Other Ambulatory Visit: Payer: Self-pay

## 2021-09-28 ENCOUNTER — Inpatient Hospital Stay (HOSPITAL_COMMUNITY)
Admission: AD | Admit: 2021-09-28 | Discharge: 2021-09-28 | Disposition: A | Discharge status: Home / Self Care | Payer: Medicaid Other | Attending: Obstetrics & Gynecology | Admitting: Obstetrics & Gynecology

## 2021-09-28 DIAGNOSIS — O4690 Antepartum hemorrhage, unspecified, unspecified trimester: Secondary | ICD-10-CM | POA: Diagnosis not present

## 2021-09-28 DIAGNOSIS — O3680X0 Pregnancy with inconclusive fetal viability, not applicable or unspecified: Secondary | ICD-10-CM | POA: Diagnosis not present

## 2021-09-28 DIAGNOSIS — O200 Threatened abortion: Secondary | ICD-10-CM

## 2021-09-28 DIAGNOSIS — Z3A00 Weeks of gestation of pregnancy not specified: Secondary | ICD-10-CM

## 2021-09-28 DIAGNOSIS — Z3A08 8 weeks gestation of pregnancy: Secondary | ICD-10-CM

## 2021-09-28 DIAGNOSIS — O209 Hemorrhage in early pregnancy, unspecified: Secondary | ICD-10-CM

## 2021-09-28 LAB — CBC
HCT: 30.2 % — ABNORMAL LOW (ref 36.0–46.0)
Hemoglobin: 10.9 g/dL — ABNORMAL LOW (ref 12.0–15.0)
MCH: 31.6 pg (ref 26.0–34.0)
MCHC: 36.1 g/dL — ABNORMAL HIGH (ref 30.0–36.0)
MCV: 87.5 fL (ref 80.0–100.0)
Platelets: 271 10*3/uL (ref 150–400)
RBC: 3.45 MIL/uL — ABNORMAL LOW (ref 3.87–5.11)
RDW: 12.1 % (ref 11.5–15.5)
WBC: 5 10*3/uL (ref 4.0–10.5)
nRBC: 0 % (ref 0.0–0.2)

## 2021-09-28 LAB — URINALYSIS, ROUTINE W REFLEX MICROSCOPIC
Bacteria, UA: NONE SEEN
Bilirubin Urine: NEGATIVE
Glucose, UA: NEGATIVE mg/dL
Ketones, ur: NEGATIVE mg/dL
Leukocytes,Ua: NEGATIVE
Nitrite: NEGATIVE
Protein, ur: NEGATIVE mg/dL
Specific Gravity, Urine: 1.013 (ref 1.005–1.030)
pH: 5 (ref 5.0–8.0)

## 2021-09-28 LAB — HCG, QUANTITATIVE, PREGNANCY: hCG, Beta Chain, Quant, S: 18469 m[IU]/mL — ABNORMAL HIGH (ref <5)

## 2021-09-28 NOTE — MAU Note (Signed)
Presents with c/o VB, reports bleeding isn't heavy but initially VB was heavier and bright red.  Reports was using a sex toy "the rose" last night and concerned VB is due to multiple orgasms.  Denies current abdominal cramping, reports had cramping earlier.

## 2021-09-28 NOTE — MAU Provider Note (Addendum)
History   Patient reports that she had some VB last night after using "the rose" sex toy and having penetrative sex with her boyfriend. She  had a single instance of sharp pain with sex but that it passed. She says that bleeding was initially "like the beginning of a period" and that she passed a clot that was "long and stringy and about 1/4 the size of a cheese stick." Bleeding has since subsided. She does endorse some cramping this morning while walking up stairs.   She  was seen in the office yesterday and had an [place] which showed some interval progression of gestational sac with possible early embryo but no definite cardiac activity. Per rads report: "Findings raise suspicion for failed pregnancy but are not yet definitive." She is scheduled for follow-up US on 10/12.   CSN: [id number]  Arrival date and time: 09/28/21 1125   None     Chief Complaint  Patient presents with   Vaginal Bleeding     OB History     Gravida  1   Para  0   Term  0   Preterm  0   AB  0   Living  0      SAB  0   IAB  0   Ectopic  0   Multiple  0   Live Births  0           Past Medical History:  Diagnosis Date   Asthma    Chlamydia    Gonorrhea    Trichomonas infection     Past Surgical History:  Procedure Laterality Date   ROOT CANAL      Family History  Problem Relation Age of Onset   Cancer Mother     Social History   Tobacco Use   Smoking status: Never   Smokeless tobacco: Never  Vaping Use   Vaping Use: Never used  Substance Use Topics   Alcohol use: Not Currently    Comment: occ, none since found out   Drug use: Not Currently    Frequency: 3.0 times per week    Types: Marijuana    Comment: not since found out preg    Allergies:  Allergies  Allergen Reactions   Peanut-Containing Drug Products Anaphylaxis   Shrimp [Shellfish Allergy] Anaphylaxis    Makes throat itchy and dry   Watermelon Flavor Anaphylaxis   Lactase Other (See Comments)     Constipation, gi upset    Medications Prior to Admission  Medication Sig Dispense Refill Last Dose   Prenatal MV & Min w/FA-DHA (PRENATAL GUMMIES PO) Take 1 tablet by mouth daily.   09/28/2021    Review of Systems  Constitutional:  Negative for chills and fever.  Gastrointestinal:  Positive for abdominal pain.  Genitourinary:  Positive for vaginal bleeding. Negative for dysuria, flank pain, hematuria, pelvic pain, vaginal discharge and vaginal pain.  Physical Exam   Blood pressure 104/72, pulse 91, temperature 98.3 F (36.8 C), temperature source Oral, resp. rate 20, height 5\' 3"  (1.6 m), weight 56.6 kg, last menstrual period 07/29/2021, SpO2 99 %.  Physical Exam Vitals reviewed.  Constitutional:      General: She is not in acute distress. Cardiovascular:     Rate and Rhythm: Normal rate and regular rhythm.  Pulmonary:     Effort: Pulmonary effort is normal.     Breath sounds: Normal breath sounds.  Abdominal:     General: Abdomen is flat. There is no distension.  Palpations: Abdomen is soft.     Tenderness: There is no abdominal tenderness.  Musculoskeletal:        General: No swelling or deformity.     Right lower leg: No edema.     Left lower leg: No edema.  Neurological:     Mental Status: She is alert.    MAU Course  Procedures  MDM Patient had an [place] yesterday with questionable interval growth, suspicious but not definitive for early pregnancy loss. Elected not to repeat US today given low likelihood of change since yesterday.    CBC today with mild anemia (Hgb 10.9) hCG quant 18,469. Was 9,114  Assessment and Plan  #vaginal bleeding Symptoms and overall clinical picture are concerning but not definitive for pregnancy loss. Interval growth and presence/absence of cardiac activity on f/u US will be definitive.  - Patient has scheduled f/u US on 10/12 - ED return precautions given for increased/continued bleeding, pain, fever   Dorothyann Gibbs 09/28/2021,  12:09 PM   Attestation of Supervision of Student:  I confirm that I have verified the information documented in the  resident's  note and that I have also personally reperformed the history, physical exam and all medical decision making activities.  I have verified that all services and findings are accurately documented in this student's note; and I agree with management and plan as outlined in the documentation. I have also made any necessary editorial changes.   Thressa Sheller DNP, CNM  09/28/21  6:35 PM

## 2021-10-02 ENCOUNTER — Encounter (HOSPITAL_COMMUNITY): Payer: Self-pay

## 2021-10-02 ENCOUNTER — Emergency Department (HOSPITAL_COMMUNITY)
Admission: EM | Admit: 2021-10-02 | Discharge: 2021-10-03 | Disposition: A | Discharge status: Home / Self Care | Payer: Medicaid Other | Attending: Emergency Medicine | Admitting: Emergency Medicine

## 2021-10-02 ENCOUNTER — Other Ambulatory Visit: Payer: Self-pay

## 2021-10-02 DIAGNOSIS — O209 Hemorrhage in early pregnancy, unspecified: Secondary | ICD-10-CM | POA: Diagnosis not present

## 2021-10-02 DIAGNOSIS — Z91010 Allergy to peanuts: Secondary | ICD-10-CM | POA: Insufficient documentation

## 2021-10-02 DIAGNOSIS — O208 Other hemorrhage in early pregnancy: Secondary | ICD-10-CM | POA: Diagnosis not present

## 2021-10-02 DIAGNOSIS — O26851 Spotting complicating pregnancy, first trimester: Secondary | ICD-10-CM | POA: Diagnosis present

## 2021-10-02 DIAGNOSIS — O4690 Antepartum hemorrhage, unspecified, unspecified trimester: Secondary | ICD-10-CM

## 2021-10-02 DIAGNOSIS — Z3A01 Less than 8 weeks gestation of pregnancy: Secondary | ICD-10-CM | POA: Diagnosis not present

## 2021-10-02 DIAGNOSIS — R102 Pelvic and perineal pain: Secondary | ICD-10-CM | POA: Diagnosis not present

## 2021-10-02 DIAGNOSIS — J45909 Unspecified asthma, uncomplicated: Secondary | ICD-10-CM | POA: Insufficient documentation

## 2021-10-02 LAB — BASIC METABOLIC PANEL
Anion gap: 4 — ABNORMAL LOW (ref 5–15)
BUN: 6 mg/dL (ref 6–20)
CO2: 28 mmol/L (ref 22–32)
Calcium: 9.6 mg/dL (ref 8.9–10.3)
Chloride: 106 mmol/L (ref 98–111)
Creatinine, Ser: 0.63 mg/dL (ref 0.44–1.00)
GFR, Estimated: >60 mL/min (ref >60)
Glucose, Bld: 95 mg/dL (ref 70–99)
Potassium: 4.1 mmol/L (ref 3.5–5.1)
Sodium: 138 mmol/L (ref 135–145)

## 2021-10-02 LAB — CBC WITH DIFFERENTIAL/PLATELET
Abs Immature Granulocytes: 0.02 10*3/uL (ref 0.00–0.07)
Basophils Absolute: 0 10*3/uL (ref 0.0–0.1)
Basophils Relative: 0 %
Eosinophils Absolute: 0.1 10*3/uL (ref 0.0–0.5)
Eosinophils Relative: 2 %
HCT: 32 % — ABNORMAL LOW (ref 36.0–46.0)
Hemoglobin: 11 g/dL — ABNORMAL LOW (ref 12.0–15.0)
Immature Granulocytes: 0 %
Lymphocytes Relative: 23 %
Lymphs Abs: 1.4 10*3/uL (ref 0.7–4.0)
MCH: 31.5 pg (ref 26.0–34.0)
MCHC: 34.4 g/dL (ref 30.0–36.0)
MCV: 91.7 fL (ref 80.0–100.0)
Monocytes Absolute: 0.5 10*3/uL (ref 0.1–1.0)
Monocytes Relative: 8 %
Neutro Abs: 3.9 10*3/uL (ref 1.7–7.7)
Neutrophils Relative %: 67 %
Platelets: 307 10*3/uL (ref 150–400)
RBC: 3.49 MIL/uL — ABNORMAL LOW (ref 3.87–5.11)
RDW: 12.5 % (ref 11.5–15.5)
WBC: 5.8 10*3/uL (ref 4.0–10.5)
nRBC: 0 % (ref 0.0–0.2)

## 2021-10-02 LAB — HCG, QUANTITATIVE, PREGNANCY: hCG, Beta Chain, Quant, S: 32384 m[IU]/mL — ABNORMAL HIGH (ref <5)

## 2021-10-02 NOTE — ED Provider Notes (Signed)
Emergency Medicine Provider Triage Evaluation Note  Cassie Nguyen , a 25 y.o. female  was evaluated in triage.  Pt complains of vaginal bleeding.  She is estimated [redacted] weeks pregnant.  She was seen on the 29th for heavy vaginal bleeding in the MAU and had ultrasound that showed a single IUP with questionable viability.  Patient states that she was doing well until tonight when she began having some vaginal spotting and cramping.  Review of Systems  Positive: Vaginal bleeding Negative: Urinary symptom  Physical Exam  LMP 07/29/2021  Gen:   Awake, no distress   Resp:  Normal effort  MSK:   Moves extremities without difficulty  Other:  No abdominal tenderness  Medical Decision Making  Medically screening exam initiated at 7:25 PM.  Appropriate orders placed.  Cassie Nguyen was informed that the remainder of the evaluation will be completed by another provider, this initial triage assessment does not replace that evaluation, and the importance of remaining in the ED until their evaluation is complete.  Patient with vaginal bleeding in early pregnancy.  Given her ultrasound at the MAU just a few days ago I do not feel she needs repeat as she had a visible IUP.  Will obtain blood work including quantitative hCG.   Arthor Captain, PA-C 10/02/21 2044    Virgina Norfolk, DO 10/02/21 2242

## 2021-10-02 NOTE — ED Triage Notes (Signed)
Pt c/o spotting after using a sexual toy September 29th. Pt c/o spotting today and cramping. Pt states she is 6-[redacted] weeks pregnant.

## 2021-10-03 ENCOUNTER — Telehealth: Payer: Self-pay | Admitting: General Practice

## 2021-10-03 ENCOUNTER — Emergency Department (HOSPITAL_COMMUNITY): Payer: Medicaid Other

## 2021-10-03 DIAGNOSIS — O208 Other hemorrhage in early pregnancy: Secondary | ICD-10-CM | POA: Diagnosis not present

## 2021-10-03 DIAGNOSIS — Z3A01 Less than 8 weeks gestation of pregnancy: Secondary | ICD-10-CM | POA: Diagnosis not present

## 2021-10-03 NOTE — Telephone Encounter (Signed)
Called patient and discussed ultrasound appt on 10/12 with MD visit to follow for results. Also discussed rescheduling intake appt for a later date as we usually do that closer to 9 weeks and she is currently 6 weeks. Patient verbalized understanding to all.

## 2021-10-03 NOTE — ED Provider Notes (Signed)
[facility]-EMERGENCY DEPT Provider Note   CSN: [id number] Arrival date & time: 10/02/21  1911     History Chief Complaint  Patient presents with   Bleeding With Pregnancy    Cassie Nguyen is a 25 y.o. female.  Who presents emergency department with vaginal bleeding in pregnancy.  States that on 09/28/2021 had a few hours of bleeding.  She was seen by OB/GYN, and the bleeding went away completely.  States that last night she began having abdominal cramping and bleeding again.  She denies fevers, lightheadedness or dizziness, nausea, abdominal pain.  She has an ultrasound scheduled for 10/21 with OB/GYN.  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   Patellar tendon rupture 02/19/2012    Past Surgical History:  Procedure Laterality Date   ROOT CANAL       OB History     Gravida  1   Para  0   Term  0   Preterm  0   AB  0   Living  0      SAB  0   IAB  0   Ectopic  0   Multiple  0   Live Births  0           Family History  Problem Relation Age of Onset   Cancer Mother     Social History   Tobacco Use   Smoking status: Never   Smokeless tobacco: Never  Vaping Use   Vaping Use: Never used  Substance Use Topics   Alcohol use: Not Currently    Comment: occ, none since found out   Drug use: Not Currently    Frequency: 3.0 times per week    Types: Marijuana    Comment: not since found out preg    Home Medications Prior to Admission medications   Medication Sig Start Date End Date Taking? Authorizing Provider  Prenatal MV & Min w/FA-DHA (PRENATAL GUMMIES PO) Take 1 tablet by mouth daily.    [provider]   medroxyPROGESTERone (PROVERA) 10 MG tablet Take 1 tablet (10 mg total) by mouth daily. Patient not taking: Reported on 07/20/2019 06/25/19 02/26/20  Merrilee Jansky, MD    Allergies    Peanut-containing drug products, Shrimp [shellfish allergy], Watermelon flavor, and Lactase  Review of Systems   Review of Systems  Constitutional:  Negative for fever.  Gastrointestinal:  Negative for abdominal pain, nausea and vomiting.  Genitourinary:  Positive for pelvic pain and vaginal bleeding.  All other systems reviewed and are negative.  Physical Exam Updated Vital Signs BP 99/78   Pulse 75   Temp 98 F (36.7 C) (Oral)   Resp 16   LMP 07/29/2021   SpO2 100%   Physical Exam Vitals and nursing note reviewed.  Constitutional:      General: She is not in acute distress.    Appearance: Normal appearance. She is not toxic-appearing.  HENT:     Head: Normocephalic and atraumatic.  Eyes:     General: No scleral icterus.    Pupils: Pupils are equal, round, and reactive to light.  Cardiovascular:     Rate  and Rhythm: Normal rate and regular rhythm.     Pulses: Normal pulses.  Pulmonary:     Effort: Pulmonary effort is normal. No respiratory distress.     Breath sounds: Normal breath sounds.  Abdominal:     General: Bowel sounds are normal. There is no distension.     Palpations: Abdomen is soft.     Tenderness: There is no right CVA tenderness, left CVA tenderness or guarding.  Musculoskeletal:     Cervical back: Normal range of motion.  Skin:    General: Skin is warm and dry.     Capillary Refill: Capillary refill takes less than 2 seconds.     Findings: No rash.  Neurological:     General: No focal deficit present.     Mental Status: She is alert and oriented to person, place, and time. Mental status is at baseline.  Psychiatric:        Mood and Affect: Mood normal.        Behavior: Behavior normal.        Thought Content: Thought content normal.        Judgment: Judgment  normal.    ED Results / Procedures / Treatments   Labs (all labs ordered are listed, but only abnormal results are displayed) Labs Reviewed  BASIC METABOLIC PANEL - Abnormal; Notable for the following components:      Result Value   Anion gap 4 (*)    All other components within normal limits  CBC WITH DIFFERENTIAL/PLATELET - Abnormal; Notable for the following components:   RBC 3.49 (*)    Hemoglobin 11.0 (*)    HCT 32.0 (*)    All other components within normal limits  HCG, QUANTITATIVE, PREGNANCY - Abnormal; Notable for the following components:   hCG, Beta Chain, [place] Longest 32,384 (*)    All other components within normal limits    EKG None  Radiology US OB Transvaginal  Result Date: 10/03/2021 CLINICAL DATA:  Bleeding EXAM: TRANSVAGINAL OB ULTRASOUND TECHNIQUE: Transvaginal ultrasound was performed for complete evaluation of the gestation as well as the maternal uterus, adnexal regions, and pelvic cul-de-sac. COMPARISON:  None. Obstetric ultrasound 09/27/2021 FINDINGS: Intrauterine gestational sac: Single Yolk sac:  Visualized. Embryo:  Visualized. Cardiac Activity: Visualized. Heart Rate: 82 bpm MSD: 13.8 mm   6 w   3 d CRL:  3.6 mm   6 w   0 d                  [place] EDC: 05/29/2022 Subchorionic hemorrhage:  Small subchorionic hemorrhage identified. Maternal uterus/adnexae: An echogenic right ovarian mass measuring 5.1 cm x 2.5 cm x 3.6 cm is again seen. The left ovary is unremarkable. IMPRESSION: 1. Single live intrauterine pregnancy identified with an estimated gestational age of [redacted] weeks 0 days by crown-rump length. Heart rate measures 82 bpm. 2. Small subchorionic hemorrhage. 3. Unchanged echogenic right ovarian lesion favored to reflect a dermoid. Electronically Signed   By: Lesia Hausen M.D.   On: 10/03/2021 11:05    Procedures Procedures   Medications Ordered in ED Medications - No data to display  ED Course  I have reviewed the triage vital signs and the nursing  notes.  Pertinent labs & imaging results that were available during my care of the patient were reviewed by me and considered in my medical decision making (see chart for details).  Clinical Course as of 10/03/21 1201  Tue Oct 03, 2021  [id number] [LA]  Clinical Course User Index [LA] Lenard Galloway   MDM Rules/Calculators/A&P 25 year old female presents emergency department for vaginal bleeding in pregnancy.  She has been seen multiple times in the past month for pregnancy, abdominal cramping and vaginal bleeding.  This recently she was seen on 09/28/2021 for vaginal bleeding and pregnancy.  She was seen by Dr. Debroah Loop, OB/GYN.  At that time her hCG quant was rising with last level 18,469.  At that time they elected not to repeat your ultrasound given low likelihood of change since the previous day when she also presented for an ultrasound.  At the time her symptoms are concerning for pregnancy loss and she was to have a scheduled follow-up ultrasound on 10/12.  Today her beta hCG continues to rise it is now 32,384. Hemoglobin 11 which is increased from previous. Obtained ultrasound transvaginal given that it has been at least 7 days since her last.  She still has a single live intrauterine pregnancy with estimated gestational age of [redacted] weeks.  Heart rate measures 82 bpm.  There is a small subchorionic hemorrhage.  OB/GYN Dr. Vergie Living with [name].  He states that this is not a reassuring ultrasound, however to discharge the patient with return precautions.  He states that he will set up an ultrasound for her in a week to monitor the pregnancy and he will call her with the scheduled time.  He does not think there needs to be any further imaging or work-up at this time.  Patient's vital signs are stable and she is safe for discharge at this time she understands return precautions and that she will be following up with OB/GYN.  Final Clinical Impression(s) / ED  Diagnoses Final diagnoses:  Vaginal bleeding in pregnancy    Rx / DC Orders ED Discharge Orders     None        Cristopher Peru, PA-C 10/03/21 1203    Derwood Kaplan, MD 10/13/21 [phone]

## 2021-10-03 NOTE — Discharge Instructions (Addendum)
You are seen the emergency department today for vaginal bleeding.  While you were here we did a transvaginal ultrasound which shows that you still have a live pregnancy.  I spoke with the OB/GYN who would like you to follow-up with them in a week and to continue monitoring the pregnancy.  Please return to emergency department if you have increased vaginal bleeding or pain, fevers, lightheaded or dizziness, shortness of breath or chest pain.

## 2021-10-04 ENCOUNTER — Telehealth: Payer: Self-pay

## 2021-10-04 NOTE — Telephone Encounter (Signed)
Transition Care Management Follow-up Telephone Call Date of discharge and from where: 10/03/2021-[place]  How have you been since you were released from the hospital? Patient stated she is doing ok.  Any questions or concerns? No  Items Reviewed: Did the pt receive and understand the discharge instructions provided? Yes  Medications obtained and verified?  No medications given at discharge Other? No  Any new allergies since your discharge? No  Dietary orders reviewed? No Do you have support at home? Yes   Home Care and Equipment/Supplies: Were home health services ordered? not applicable If so, what is the name of the agency? N/A  Has the agency set up a time to come to the patient's home? not applicable Were any new equipment or medical supplies ordered?  No What is the name of the medical supply agency? N/A Were you able to get the supplies/equipment? not applicable Do you have any questions related to the use of the equipment or supplies? No  Functional Questionnaire: (I = Independent and D = Dependent) ADLs: I  Bathing/Dressing- I  Meal Prep- I  Eating- I  Maintaining continence- I  Transferring/Ambulation- I  Managing Meds- I  Follow up appointments reviewed:  PCP Hospital f/u appt confirmed? No   Specialist Hospital f/u appt confirmed? Yes  Scheduled to see Dr. Alysia Penna on 10/11/2021 @ 10:00 am. Are transportation arrangements needed? No  If their condition worsens, is the pt aware to call PCP or go to the Emergency Dept.? Yes Was the patient provided with contact information for the PCP's office or ED? Yes Was to pt encouraged to call back with questions or concerns? Yes

## 2021-10-05 ENCOUNTER — Telehealth: Payer: Medicaid Other

## 2021-10-10 ENCOUNTER — Other Ambulatory Visit: Payer: Self-pay

## 2021-10-10 ENCOUNTER — Ambulatory Visit (INDEPENDENT_AMBULATORY_CARE_PROVIDER_SITE_OTHER): Payer: Medicaid Other

## 2021-10-10 ENCOUNTER — Other Ambulatory Visit (HOSPITAL_COMMUNITY)
Admission: RE | Admit: 2021-10-10 | Discharge: 2021-10-10 | Disposition: A | Discharge status: Home / Self Care | Payer: Medicaid Other | Source: Ambulatory Visit | Attending: Family Medicine | Admitting: Family Medicine

## 2021-10-10 VITALS — BP 119/94 | HR 99 | Wt 127.9 lb

## 2021-10-10 DIAGNOSIS — N898 Other specified noninflammatory disorders of vagina: Secondary | ICD-10-CM | POA: Diagnosis not present

## 2021-10-10 MED ORDER — TERCONAZOLE 0.4 % VA CREA: 1.0000 | TOPICAL_CREAM | Freq: Every day | VAGINAL | 0 refills | Status: DC

## 2021-10-10 NOTE — Progress Notes (Signed)
Here today with complaint of vaginal itching during early pregnancy. Vaginal swab completed on 08/31/21 that showed positive yeast. Pt reports not completing treatment for yeast infection at that time. Offered treatment per protocol. Pt states she would prefer to recollect vaginal swab and have prescription sent for Terazol cream. I explained an additional vaginal swab may not be covered by insurance as this is not medically necessary. Pt would like to proceed with swab. Self-swab instructions given and specimen obtained. Explained we will contact patient with any abnormal results.   Pt reports concern regarding low iron levels. I explained that her hemoglobin was below the normal limit and this is something that we will discuss at provider visit on 10/12/21. Explained this does not require any immediate intervention.  Will return for [place] and provider visit 10/12/21.  Fleet Contras RN 10/10/21

## 2021-10-11 ENCOUNTER — Ambulatory Visit: Payer: Medicaid Other

## 2021-10-11 ENCOUNTER — Ambulatory Visit: Payer: Medicaid Other | Admitting: Obstetrics and Gynecology

## 2021-10-11 LAB — CERVICOVAGINAL ANCILLARY ONLY
Bacterial Vaginitis (gardnerella): POSITIVE — AB
Candida Glabrata: NEGATIVE
Candida Vaginitis: POSITIVE — AB
Chlamydia: NEGATIVE
Comment: NEGATIVE
Comment: NEGATIVE
Comment: NEGATIVE
Comment: NEGATIVE
Comment: NEGATIVE
Comment: NORMAL
Neisseria Gonorrhea: NEGATIVE
Trichomonas: NEGATIVE

## 2021-10-12 ENCOUNTER — Other Ambulatory Visit: Payer: Self-pay

## 2021-10-12 ENCOUNTER — Ambulatory Visit
Admission: RE | Admit: 2021-10-12 | Discharge: 2021-10-12 | Disposition: A | Discharge status: Home / Self Care | Payer: Medicaid Other | Source: Ambulatory Visit | Attending: Obstetrics & Gynecology | Admitting: Obstetrics & Gynecology

## 2021-10-12 ENCOUNTER — Ambulatory Visit (INDEPENDENT_AMBULATORY_CARE_PROVIDER_SITE_OTHER): Payer: Medicaid Other | Admitting: Obstetrics & Gynecology

## 2021-10-12 VITALS — BP 104/67 | HR 85 | Wt 126.3 lb

## 2021-10-12 DIAGNOSIS — O26891 Other specified pregnancy related conditions, first trimester: Secondary | ICD-10-CM | POA: Diagnosis not present

## 2021-10-12 DIAGNOSIS — Z3491 Encounter for supervision of normal pregnancy, unspecified, first trimester: Secondary | ICD-10-CM | POA: Diagnosis present

## 2021-10-12 DIAGNOSIS — O26899 Other specified pregnancy related conditions, unspecified trimester: Secondary | ICD-10-CM | POA: Insufficient documentation

## 2021-10-12 DIAGNOSIS — R109 Unspecified abdominal pain: Secondary | ICD-10-CM | POA: Insufficient documentation

## 2021-10-12 DIAGNOSIS — O3680X0 Pregnancy with inconclusive fetal viability, not applicable or unspecified: Secondary | ICD-10-CM | POA: Diagnosis not present

## 2021-10-12 DIAGNOSIS — Z3401 Encounter for supervision of normal first pregnancy, first trimester: Secondary | ICD-10-CM

## 2021-10-12 DIAGNOSIS — Z3A01 Less than 8 weeks gestation of pregnancy: Secondary | ICD-10-CM | POA: Diagnosis not present

## 2021-10-12 NOTE — Progress Notes (Signed)
Ultrasounds Results Note  SUBJECTIVE HPI:  [name] is a 25 y.o. G1P0000 at [redacted]w[redacted]d by LMP who presents to the [facility] for followup ultrasound results. The patient denies abdominal pain or vaginal bleeding.    Ultrasound showed [redacted]w[redacted]d with FHM today, but less growth than expected.  Past Medical History:  Diagnosis Date   Asthma    Chlamydia    Gonorrhea    Trichomonas infection    Past Surgical History:  Procedure Laterality Date   ROOT CANAL     Social History   Socioeconomic History   Marital status: Single    Spouse name: Not on file   Number of children: Not on file   Years of education: Not on file   Highest education level: Not on file  Occupational History   Not on file  Tobacco Use   Smoking status: Never   Smokeless tobacco: Never  Vaping Use   Vaping Use: Never used  Substance and Sexual Activity   Alcohol use: Not Currently    Comment: occ, none since found out   Drug use: Not Currently    Frequency: 3.0 times per week    Types: Marijuana    Comment: not since found out preg   Sexual activity: Yes  Other Topics Concern   Not on file  Social History Narrative   Not on file   Social Determinants of Health   Financial Resource Strain: Not on file  Food Insecurity: No Food Insecurity   Worried About Programme researcher, broadcasting/film/video in the Last Year: Never true   Ran Out of Food in the Last Year: Never true  Transportation Needs: No Transportation Needs   Lack of Transportation (Medical): No   Lack of Transportation (Non-Medical): No  Physical Activity: Not on file  Stress: Not on file  Social Connections: Not on file  Intimate Partner Violence: Not on file   Current Outpatient Medications on File Prior to Visit  Medication Sig Dispense Refill   Prenatal MV & Min w/FA-DHA (PRENATAL GUMMIES PO) Take 1 tablet by mouth daily.     metroNIDAZOLE (METROGEL) 0.75 % vaginal gel Place 1 application vaginally at bedtime. (Patient not taking:  No sig reported)     terconazole (TERAZOL 7) 0.4 % vaginal cream Place 1 applicator vaginally at bedtime. (Patient not taking: Reported on 10/12/2021) 45 g 0   [DISCONTINUED] medroxyPROGESTERone (PROVERA) 10 MG tablet Take 1 tablet (10 mg total) by mouth daily. (Patient not taking: Reported on 07/20/2019) 10 tablet 0   No current facility-administered medications on file prior to visit.   Allergies  Allergen Reactions   Peanut-Containing Drug Products Anaphylaxis   Shrimp [Shellfish Allergy] Anaphylaxis    Makes throat itchy and dry   Watermelon Flavor Anaphylaxis   Lactase Other (See Comments)    Constipation, gi upset    I have reviewed patient's Past Medical Hx, Surgical Hx, Family Hx, Social Hx, medications and allergies.   Review of Systems Review of Systems  Constitutional: Negative for fever and chills.  Gastrointestinal: Negative for nausea, vomiting, abdominal pain, diarrhea and constipation.  Genitourinary: Negative for dysuria.  Musculoskeletal: Negative for back pain.  Neurological: Negative for dizziness and weakness.    Physical Exam  BP 104/67   Pulse 85   Wt 126 lb 4.8 oz (57.3 kg)   LMP 07/29/2021   BMI 22.37 kg/m   GENERAL: Well-developed, well-nourished female in no acute distress.  HEENT: Normocephalic, atraumatic.   LUNGS: Effort normal  ABDOMEN: soft, non-tender HEART: Regular rate  SKIN: Warm, dry and without erythema PSYCH: Normal mood and affect NEURO: Alert and oriented x 4  LAB RESULTS No results found for this or any previous visit (from the past 24 hour(s)).  IMAGING US OB Transvaginal  Result Date: 10/03/2021 CLINICAL DATA:  Bleeding EXAM: TRANSVAGINAL OB ULTRASOUND TECHNIQUE: Transvaginal ultrasound was performed for complete evaluation of the gestation as well as the maternal uterus, adnexal regions, and pelvic cul-de-sac. COMPARISON:  None. Obstetric ultrasound 09/27/2021 FINDINGS: Intrauterine gestational sac: Single Yolk sac:   Visualized. Embryo:  Visualized. Cardiac Activity: Visualized. Heart Rate: 82 bpm MSD: 13.8 mm   6 w   3 d CRL:  3.6 mm   6 w   0 d                  [place] EDC: 05/29/2022 Subchorionic hemorrhage:  Small subchorionic hemorrhage identified. Maternal uterus/adnexae: An echogenic right ovarian mass measuring 5.1 cm x 2.5 cm x 3.6 cm is again seen. The left ovary is unremarkable. IMPRESSION: 1. Single live intrauterine pregnancy identified with an estimated gestational age of [redacted] weeks 0 days by crown-rump length. Heart rate measures 82 bpm. 2. Small subchorionic hemorrhage. 3. Unchanged echogenic right ovarian lesion favored to reflect a dermoid. Electronically Signed   By: Lesia Hausen M.D.   On: 10/03/2021 11:05   US OB Transvaginal  Result Date: 09/27/2021 CLINICAL DATA:  First trimester pregnancy with inconclusive fetal viability. Right adnexal mass. EXAM: TRANSVAGINAL OB ULTRASOUND TECHNIQUE: Transvaginal ultrasound was performed for complete evaluation of the gestation as well as the maternal uterus, adnexal regions, and pelvic cul-de-sac. COMPARISON:  09/17/2021 FINDINGS: Intrauterine gestational sac: Single, with irregular sac shape noted Yolk sac:  Visualized. Embryo:  Possible tiny embryo now seen Cardiac Activity: Not Visualized. MSD: 12 mm   6 w   0 d CRL:   1.6 mm  out of range Subchorionic hemorrhage:  None visualized. Maternal uterus/adnexae: Retroverted uterus again noted. Normal appearance of left ovary. A right ovarian mass is again seen which is markedly echogenic and also contains calcification. This measures 6.2 x 3.6 x 3.3 cm, consistent with a benign ovarian dermoid. No evidence of free fluid. IMPRESSION: Single intrauterine gestational sac shows some interval progression, now measuring 6 weeks 0 days by mean sac diameter. Possible early embryo, but no definite cardiac activity visualized. Findings raise suspicion for failed pregnancy, but are not yet definitive. Recommend follow-up US in 7 days  for definitive diagnosis. This recommendation follows SRU consensus guidelines: Diagnostic Criteria for Nonviable Pregnancy Early in the First Trimester. Malva Limes Med 2013; 742:5956-38. 6 cm benign right ovarian dermoid. Electronically Signed   By: Danae Orleans M.D.   On: 09/27/2021 12:23   US OB Transvaginal  Addendum Date: 09/17/2021   ADDENDUM REPORT: 09/17/2021 19:51 ADDENDUM: Correction to IMPRESSION: 1. Single intrauterine gestational sac measuring 5 weeks 2 days by mean gestational sac diameter. A yolk sac is visualized, but embryo is not visualized. Early intrauterine gestational sac, but no fetal pole, or cardiac activity yet visualized. Recommend follow-up quantitative B-HCG levels and follow-up US in 14 days to assess viability. This recommendation follows SRU consensus guidelines: Diagnostic Criteria for Nonviable Pregnancy Early in the First Trimester. Malva Limes Med 2013; 756:4332-95. 2. 5.0 cm right ovarian mass, indeterminate. Findings may be related to fibroma thecoma, Brenner tumor or other solid mass. Follow-up imaging with MRI should be considered when clinically appropriate. *These results were called by telephone  at the time of interpretation on 09/17/2021 at 7:23 pm to provider Judeth Horn , who verbally acknowledged these results. Electronically Signed   By: Darliss Cheney M.D.   On: 09/17/2021 19:51   Result Date: 09/17/2021 CLINICAL DATA:  Abdominal cramping. EXAM: OBSTETRIC <14 WK ULTRASOUND TECHNIQUE: Transabdominal ultrasound was performed for evaluation of the gestation as well as the maternal uterus and adnexal regions. COMPARISON:  Obstetrical ultrasound 09/15/2021. FINDINGS: Intrauterine gestational sac: Single Yolk sac:  Visualized. Embryo:  Not Visualized. MSD:  6.6 mm   5 w   2 d Subchorionic hemorrhage:  None visualized. Maternal uterus/adnexae: Left ovary: Corpus luteum cyst measuring 2 cm. Otherwise within normal limits. Right ovary: Enlarged measuring 6.4 x 4.5 x 3.5 cm.  It contains a smooth bordered, ovoid, homogeneous mildly echogenic mass without vascularity. Punctate echogenic foci within the mass, likely calcifications. Measures 5.0 x 3.4 x 3.0 cm. IMPRESSION: 1. Single intrauterine gestational sac measuring 5 weeks 2 days by mean gestational sac diameter. A yolk sac is visualized, but embryo is not visualized. Probable early intrauterine gestational sac, but no yolk sac, fetal pole, or cardiac activity yet visualized. Recommend follow-up quantitative B-HCG levels and follow-up US in 14 days to assess viability. This recommendation follows SRU consensus guidelines: Diagnostic Criteria for Nonviable Pregnancy Early in the First Trimester. Malva Limes Med 2013; 814:4818-56. 2. 5.0 cm right ovarian mass, indeterminate. Findings may be related to fibroma thecoma, Brenner tumor or other solid mass. Follow-up imaging with MRI should be considered when clinically appropriate. 3. These results were called by telephone at the time of interpretation on 09/17/2021 at 7:23 pm to provider Judeth Horn , who verbally acknowledged these results. Electronically Signed: By: Darliss Cheney M.D. On: 09/17/2021 19:24   US OB LESS THAN 14 WEEKS WITH OB TRANSVAGINAL  Result Date: 10/12/2021 CLINICAL DATA:  Abdominal cramping, assess fetal viability EXAM: OBSTETRIC <14 WK [place] AND TRANSVAGINAL OB US TECHNIQUE: Both transabdominal and transvaginal ultrasound examinations were performed for complete evaluation of the gestation as well as the maternal uterus, adnexal regions, and pelvic cul-de-sac. Transvaginal technique was performed to assess early pregnancy. COMPARISON:  10/03/2021 FINDINGS: Intrauterine gestational sac: Single Yolk sac:  Not Visualized. Embryo:  Visualized. Cardiac Activity: Visualized. Heart Rate: 114 bpm CRL:  6.4 mm   6 w   3 d                  [place] EDC: 06/04/2022 Subchorionic hemorrhage:  None visualized. Maternal uterus/adnexae: The left ovary measures 4.2 x 2.0 x 2.4 cm. Right  ovary measures 7.3 x 3.5 by 3.7 cm, with a 3.7 by 6.2 x 3.5 cm echogenic mass most consistent with dermoid. This is unchanged. No free fluid. IMPRESSION: 1. Single live intrauterine pregnancy as above, estimated age 82 weeks and 3 days. Of note, there is less growth of the fetal pole than would be expected in comparison to prior exam 10/03/2021. Continued follow-up is warranted. 2. 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 Electronically Signed   By: Sharlet Salina M.D.   On: 10/12/2021 15:08   US OB LESS THAN 14 WEEKS WITH OB TRANSVAGINAL  Result Date: 09/15/2021 CLINICAL DATA:  Abdominal cramping.  Early pregnancy. EXAM: OBSTETRIC <14 WK [place] AND TRANSVAGINAL OB US TECHNIQUE: Both transabdominal and transvaginal ultrasound examinations were performed for complete evaluation of the gestation as well as the maternal uterus, adnexal regions, and pelvic cul-de-sac. Transvaginal technique was performed to assess early pregnancy. COMPARISON:  None. FINDINGS: Intrauterine gestational sac:  Present Yolk sac:  Absent Embryo:  Absent Cardiac Activity: N/A MSD: 4.7 mm   5 w   1 d Subchorionic hemorrhage:  None visualized. Maternal uterus/adnexae: Retroverted orientation of the uterus (considered normal in the first trimester, but abnormal if this persists substantially into the second trimester). Mildly complex 1.6 cm lesion of the left ovary is likely a corpus luteum of pregnancy, with the left ovary measuring 4.7 by 2.5 by 3.7 cm. There is also a 1.1 by 0.6 cm mildly complex cystic lesion of the right ovary. The right ovary measures 2.0 by 1.2 by 1.7 cm. IMPRESSION: 1. Single intrauterine gestational sac without visible embryo or yolk sac at this time. Probable early intrauterine gestational sac, but no yolk sac, fetal pole, or cardiac activity yet visualized. Recommend follow-up quantitative B-HCG levels and follow-up US in 14 days to assess viability. This recommendation follows SRU consensus guidelines: Diagnostic Criteria  for Nonviable Pregnancy Early in the First Trimester. Malva Limes Med 2013; 585:2778-24. Electronically Signed   By: Gaylyn Rong M.D.   On: 09/15/2021 18:27    ASSESSMENT 1. Encounter for supervision of normal first pregnancy in first trimester     PLAN Discharge home in stable condition Patient advised to start/continue taking prenatal vitamins RTC for viability in 2 weeks  Adam Phenix, MD  10/12/2021  3:23 PM

## 2021-10-12 NOTE — Progress Notes (Signed)
Chart reviewed for nurse visit. Agree with plan of care.   Warner Mccreedy, MD 10/12/2021 10:29 AM

## 2021-10-25 ENCOUNTER — Encounter: Payer: Medicaid Other | Admitting: Nurse Practitioner

## 2021-10-26 ENCOUNTER — Ambulatory Visit (INDEPENDENT_AMBULATORY_CARE_PROVIDER_SITE_OTHER): Payer: Medicaid Other | Admitting: Obstetrics & Gynecology

## 2021-10-26 ENCOUNTER — Ambulatory Visit
Admission: RE | Admit: 2021-10-26 | Discharge: 2021-10-26 | Disposition: A | Discharge status: Home / Self Care | Payer: Medicaid Other | Source: Ambulatory Visit | Attending: Obstetrics & Gynecology | Admitting: Obstetrics & Gynecology

## 2021-10-26 ENCOUNTER — Other Ambulatory Visit: Payer: Self-pay

## 2021-10-26 VITALS — BP 101/72 | HR 77 | Wt 123.6 lb

## 2021-10-26 DIAGNOSIS — O021 Missed abortion: Secondary | ICD-10-CM

## 2021-10-26 DIAGNOSIS — Z3401 Encounter for supervision of normal first pregnancy, first trimester: Secondary | ICD-10-CM | POA: Diagnosis not present

## 2021-10-26 DIAGNOSIS — O3680X0 Pregnancy with inconclusive fetal viability, not applicable or unspecified: Secondary | ICD-10-CM | POA: Diagnosis not present

## 2021-10-26 DIAGNOSIS — Z3A01 Less than 8 weeks gestation of pregnancy: Secondary | ICD-10-CM | POA: Diagnosis not present

## 2021-10-26 NOTE — Progress Notes (Signed)
Ultrasounds Results Note  SUBJECTIVE HPI:  [name] is a 25 y.o. G1P0000 at [redacted]w[redacted]d by LMP who presents to the [facility] for followup ultrasound results. The patient denies abdominal pain or vaginal bleeding.    Ultrasound showed 6w 2 d CRL FHR 114.   Repeat ultrasound was performed earlier today.   Past Medical History:  Diagnosis Date   Asthma    Chlamydia    Gonorrhea    Trichomonas infection    Past Surgical History:  Procedure Laterality Date   ROOT CANAL     Social History   Socioeconomic History   Marital status: Single    Spouse name: Not on file   Number of children: Not on file   Years of education: Not on file   Highest education level: Not on file  Occupational History   Not on file  Tobacco Use   Smoking status: Never   Smokeless tobacco: Never  Vaping Use   Vaping Use: Never used  Substance and Sexual Activity   Alcohol use: Not Currently    Comment: occ, none since found out   Drug use: Not Currently    Frequency: 3.0 times per week    Types: Marijuana    Comment: not since found out preg   Sexual activity: Yes  Other Topics Concern   Not on file  Social History Narrative   Not on file   Social Determinants of Health   Financial Resource Strain: Not on file  Food Insecurity: No Food Insecurity   Worried About Programme researcher, broadcasting/film/video in the Last Year: Never true   Ran Out of Food in the Last Year: Never true  Transportation Needs: No Transportation Needs   Lack of Transportation (Medical): No   Lack of Transportation (Non-Medical): No  Physical Activity: Not on file  Stress: Not on file  Social Connections: Not on file  Intimate Partner Violence: Not on file   Current Outpatient Medications on File Prior to Visit  Medication Sig Dispense Refill   metroNIDAZOLE (METROGEL) 0.75 % vaginal gel Place 1 application vaginally at bedtime. (Patient not taking: No sig reported)     Prenatal MV & Min w/FA-DHA (PRENATAL GUMMIES  PO) Take 1 tablet by mouth daily.     terconazole (TERAZOL 7) 0.4 % vaginal cream Place 1 applicator vaginally at bedtime. (Patient not taking: Reported on 10/12/2021) 45 g 0   [DISCONTINUED] medroxyPROGESTERone (PROVERA) 10 MG tablet Take 1 tablet (10 mg total) by mouth daily. (Patient not taking: Reported on 07/20/2019) 10 tablet 0   No current facility-administered medications on file prior to visit.   Allergies  Allergen Reactions   Peanut-Containing Drug Products Anaphylaxis   Shrimp [Shellfish Allergy] Anaphylaxis    Makes throat itchy and dry   Watermelon Flavor Anaphylaxis   Lactase Other (See Comments)    Constipation, gi upset    I have reviewed patient's Past Medical Hx, Surgical Hx, Family Hx, Social Hx, medications and allergies.   Review of Systems Review of Systems  Constitutional: Negative for fever and chills.  Gastrointestinal: Negative for nausea, vomiting, abdominal pain, diarrhea and constipation.  Genitourinary: Negative for dysuria.  Musculoskeletal: Negative for back pain.  Neurological: Negative for dizziness and weakness.    Physical Exam  BP 101/72   Pulse 77   Wt 123 lb 9.6 oz (56.1 kg)   LMP 07/29/2021   BMI 21.89 kg/m   GENERAL: Well-developed, well-nourished female in no acute distress.  HEENT: Normocephalic,  atraumatic.   LUNGS: Effort normal ABDOMEN: soft, non-tender HEART: Regular rate  SKIN: Warm, dry and without erythema PSYCH: Normal mood and affect NEURO: Alert and oriented x 4  LAB RESULTS No results found for this or any previous visit (from the past 24 hour(s)).  IMAGING US OB Transvaginal  Result Date: 10/26/2021 CLINICAL DATA:  First trimester pregnancy, unable to establish viability; LMP 07/29/2021; quantitative beta HCG 32,384 on 10/02/2021 EXAM: TRANSVAGINAL OB ULTRASOUND TECHNIQUE: Transvaginal ultrasound was performed for complete evaluation of the gestation as well as the maternal uterus, adnexal regions, and pelvic  cul-de-sac. COMPARISON:  10/12/2021 FINDINGS: Intrauterine gestational sac: Single, irregular Yolk sac:  Questionably present/enlarged versus amniotic sac Embryo:  Present Cardiac Activity: Absent Heart Rate: N/A bpm CRL:   4.7 mm   6 w 1 d                  [place] EDC: 06/20/2022 Subchorionic hemorrhage:  None visualized. Maternal uterus/adnexae: RIGHT ovary normal size and morphology 2.0 x 1.0 x 1.8 cm LEFT ovary normal size and morphology 3.4 x 2.3 x 2.4 cm Uterus retroverted, otherwise unremarkable. No free pelvic fluid. 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 IMPRESSION: Single irregular gestational sac seen within uterus containing a small fetal pole. No cardiac activity is identified. Findings are suspicious but not yet definitive for failed pregnancy. Recommend follow-up US in 10-14 days for definitive diagnosis. This recommendation follows SRU consensus guidelines: Diagnostic Criteria for Nonviable Pregnancy Early in the First Trimester. Malva Limes Med 2013; 371:0626-94. Again identified hyperechoic RIGHT adnexal mass 6.1 x 2.8 x 3.1 cm most consistent with a dermoid tumor. Electronically Signed   By: Ulyses Southward M.D.   On: 10/26/2021 11:55   US OB Transvaginal  Result Date: 10/03/2021 CLINICAL DATA:  Bleeding EXAM: TRANSVAGINAL OB ULTRASOUND TECHNIQUE: Transvaginal ultrasound was performed for complete evaluation of the gestation as well as the maternal uterus, adnexal regions, and pelvic cul-de-sac. COMPARISON:  None. Obstetric ultrasound 09/27/2021 FINDINGS: Intrauterine gestational sac: Single Yolk sac:  Visualized. Embryo:  Visualized. Cardiac Activity: Visualized. Heart Rate: 82 bpm MSD: 13.8 mm   6 w   3 d CRL:  3.6 mm   6 w   0 d                  [place] EDC: 05/29/2022 Subchorionic hemorrhage:  Small subchorionic hemorrhage identified. Maternal uterus/adnexae: An echogenic right ovarian mass measuring 5.1 cm x 2.5 cm x 3.6 cm is  again seen. The left ovary is unremarkable. IMPRESSION: 1. Single live intrauterine pregnancy identified with an estimated gestational age of [redacted] weeks 0 days by crown-rump length. Heart rate measures 82 bpm. 2. Small subchorionic hemorrhage. 3. Unchanged echogenic right ovarian lesion favored to reflect a dermoid. Electronically Signed   By: Lesia Hausen M.D.   On: 10/03/2021 11:05   US OB Transvaginal  Result Date: 09/27/2021 CLINICAL DATA:  First trimester pregnancy with inconclusive fetal viability. Right adnexal mass. EXAM: TRANSVAGINAL OB ULTRASOUND TECHNIQUE: Transvaginal ultrasound was performed for complete evaluation of the gestation as well as the maternal uterus, adnexal regions, and pelvic cul-de-sac. COMPARISON:  09/17/2021 FINDINGS: Intrauterine gestational sac: Single, with irregular sac shape noted Yolk sac:  Visualized. Embryo:  Possible tiny embryo now seen Cardiac Activity: Not Visualized. MSD: 12 mm   6 w   0 d CRL:   1.6 mm  out of range Subchorionic hemorrhage:  None visualized. Maternal uterus/adnexae: Retroverted uterus again noted. Normal appearance of left ovary. A right ovarian mass is again seen which is markedly echogenic and also contains calcification. This measures 6.2 x 3.6 x 3.3 cm, consistent with a benign ovarian dermoid. No evidence of free fluid. IMPRESSION: Single intrauterine gestational sac shows some interval progression, now measuring 6 weeks 0 days by mean sac diameter. Possible early embryo, but no definite cardiac activity visualized. Findings raise suspicion for failed pregnancy, but are not yet definitive. Recommend follow-up US in 7 days for definitive diagnosis. This recommendation follows SRU consensus guidelines: Diagnostic Criteria for Nonviable Pregnancy Early in the First Trimester. Malva Limes Med 2013; 277:8242-35. 6 cm benign right ovarian dermoid. Electronically Signed   By: Danae Orleans M.D.   On: 09/27/2021 12:23   US OB LESS THAN 14 WEEKS WITH OB  TRANSVAGINAL  Result Date: 10/12/2021 CLINICAL DATA:  Abdominal cramping, assess fetal viability EXAM: OBSTETRIC <14 WK [place] AND TRANSVAGINAL OB US TECHNIQUE: Both transabdominal and transvaginal ultrasound examinations were performed for complete evaluation of the gestation as well as the maternal uterus, adnexal regions, and pelvic cul-de-sac. Transvaginal technique was performed to assess early pregnancy. COMPARISON:  10/03/2021 FINDINGS: Intrauterine gestational sac: Single Yolk sac:  Not Visualized. Embryo:  Visualized. Cardiac Activity: Visualized. Heart Rate: 114 bpm CRL:  6.4 mm   6 w   3 d                  [place] EDC: 06/04/2022 Subchorionic hemorrhage:  None visualized. Maternal uterus/adnexae: The left ovary measures 4.2 x 2.0 x 2.4 cm. Right ovary measures 7.3 x 3.5 by 3.7 cm, with a 3.7 by 6.2 x 3.5 cm echogenic mass most consistent with dermoid. This is unchanged. No free fluid. IMPRESSION: 1. Single live intrauterine pregnancy as above, estimated age 64 weeks and 3 days. Of note, there is less growth of the fetal pole than would be expected in comparison to prior exam 10/03/2021. Continued follow-up is warranted. 2. 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 Electronically Signed   By: Sharlet Salina M.D.   On: 10/12/2021 15:08    ASSESSMENT 1. Missed abortion   Failed pregnancy   PLAN Discharge home in stable condition We discussed the diagnosis of failed pregnancy and that she may begin to have bleeding and pain. She declined intervention with cytotec or D&C today and will RTC 11/1 as scheduled. Precautions for bleeding were given  Adam Phenix, MD  10/26/2021  1:23 PM

## 2021-10-31 ENCOUNTER — Ambulatory Visit: Payer: Medicaid Other | Admitting: Nurse Practitioner

## 2021-11-02 ENCOUNTER — Encounter: Payer: Self-pay | Admitting: Obstetrics and Gynecology

## 2021-11-02 ENCOUNTER — Telehealth: Payer: Medicaid Other

## 2021-11-02 ENCOUNTER — Other Ambulatory Visit: Payer: Self-pay

## 2021-11-02 ENCOUNTER — Ambulatory Visit (INDEPENDENT_AMBULATORY_CARE_PROVIDER_SITE_OTHER): Payer: Medicaid Other | Admitting: Obstetrics and Gynecology

## 2021-11-02 VITALS — BP 104/68 | HR 82 | Wt 129.0 lb

## 2021-11-02 DIAGNOSIS — O021 Missed abortion: Secondary | ICD-10-CM

## 2021-11-02 MED ORDER — MISOPROSTOL 200 MCG PO TABS: ORAL_TABLET | ORAL | 1 refills | Status: DC

## 2021-11-02 NOTE — Progress Notes (Signed)
25 yo P0 at [redacted]w[redacted]d by LMP diagnosed with a missed abortion on 10/27 presenting today for follow up. Patient opted for expectant management. She reports no vaginal bleeding since her last encounter on 10/26/21. She is hesitant to proceed with any interventions as she is uncertain of her diagnosis of failed pregnancy. Patient is without any other complaints  Past Medical History:  Diagnosis Date   Asthma    Chlamydia    Gonorrhea    Trichomonas infection    Past Surgical History:  Procedure Laterality Date   ROOT CANAL     Family History  Problem Relation Age of Onset   Cancer Mother    Social History   Tobacco Use   Smoking status: Never   Smokeless tobacco: Never  Vaping Use   Vaping Use: Never used  Substance Use Topics   Alcohol use: Not Currently    Comment: occ, none since found out   Drug use: Not Currently    Frequency: 3.0 times per week    Types: Marijuana    Comment: not since found out preg   ROS See pertinent in HPI. All other systems reviewed and non contributory  Blood pressure 104/68, pulse 82, weight 129 lb (58.5 kg), last menstrual period 07/29/2021.  GENERAL: Well-developed, well-nourished female in no acute distress.  NEURO: alert and oriented x 3  Pt informed that the ultrasound is considered a limited OB ultrasound and is not intended to be a complete ultrasound exam.  Patient also informed that the ultrasound is not being completed with the intent of assessing for fetal or placental anomalies or any pelvic abnormalities.  Explained that the purpose of today's ultrasound is to assess for  viability.  Patient acknowledges the purpose of the exam and the limitations of the study.   Abdominal ultrasound performed demonstrating a large empty intrauterine gestational sac  A/P 25 yo with missed abortion - repeat quant HCG today - Patient again counseled on medical vs surgical management. Patient opted for medical management and plans to wait until quant HCG  levels - Patient will be contacted with results - RTC in 2 weeks

## 2021-11-02 NOTE — Progress Notes (Signed)
Pt was given dx of failed pregnancy last week.  Pt states she has had no bleeding, mild cramping.  Pt had BV and yeast on last swab, was treated. Pt would like to know if she should retest today.

## 2021-11-06 LAB — BETA HCG QUANT (REF LAB): hCG Quant: 4086 m[IU]/mL

## 2021-11-16 ENCOUNTER — Ambulatory Visit: Payer: Medicaid Other

## 2021-12-13 ENCOUNTER — Encounter (HOSPITAL_COMMUNITY): Payer: Self-pay

## 2021-12-13 ENCOUNTER — Other Ambulatory Visit: Payer: Self-pay

## 2021-12-13 ENCOUNTER — Ambulatory Visit (HOSPITAL_COMMUNITY)
Admission: EM | Admit: 2021-12-13 | Discharge: 2021-12-13 | Disposition: A | Discharge status: Home / Self Care | Payer: Medicaid Other | Attending: Family Medicine | Admitting: Family Medicine

## 2021-12-13 ENCOUNTER — Ambulatory Visit (HOSPITAL_COMMUNITY)
Admission: EM | Admit: 2021-12-13 | Discharge: 2021-12-13 | Discharge status: Against Medical Advice | Payer: Medicaid Other

## 2021-12-13 DIAGNOSIS — Z5321 Procedure and treatment not carried out due to patient leaving prior to being seen by health care provider: Secondary | ICD-10-CM

## 2021-12-13 DIAGNOSIS — Z202 Contact with and (suspected) exposure to infections with a predominantly sexual mode of transmission: Secondary | ICD-10-CM

## 2021-12-13 DIAGNOSIS — Z711 Person with feared health complaint in whom no diagnosis is made: Secondary | ICD-10-CM | POA: Diagnosis not present

## 2021-12-13 LAB — POCT URINALYSIS DIPSTICK, ED / UC
Bilirubin Urine: NEGATIVE
Glucose, UA: NEGATIVE mg/dL
Hgb urine dipstick: NEGATIVE
Ketones, ur: NEGATIVE mg/dL
Nitrite: NEGATIVE
Protein, ur: NEGATIVE mg/dL
Specific Gravity, Urine: 1.025 (ref 1.005–1.030)
Urobilinogen, UA: 0.2 mg/dL (ref 0.0–1.0)
pH: 6.5 (ref 5.0–8.0)

## 2021-12-13 NOTE — ED Provider Notes (Signed)
[facility]    CSN: [id number] Arrival date & time: 12/13/21  1808      History   Chief Complaint Chief Complaint  Patient presents with   STD Testing     HPI Cassie Nguyen is a 25 y.o. female.   HPI Patient presents today for asymptomatic STD testing. She has a history of chlamydia, gonorrhea and trichomonas. 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 Authorizing Provider  misoprostol (CYTOTEC) 200 MCG tablet Insert four tablets vaginally 11/02/21   Constant, Peggy, MD  Prenatal MV & Min w/FA-DHA (PRENATAL GUMMIES PO) Take 1 tablet by mouth daily.    [provider]  medroxyPROGESTERone (PROVERA) 10 MG tablet Take 1 tablet (10 mg total) by mouth daily. Patient not taking: Reported on 07/20/2019 06/25/19 02/26/20  Merrilee Jansky, MD    Family History Family History  Problem Relation Age of Onset   Cancer Mother     Social History Social History   Tobacco Use   Smoking status: Never   Smokeless tobacco: Never  Vaping Use   Vaping Use: Never used  Substance Use Topics   Alcohol use: Not Currently    Comment: occ, none since found out   Drug use: Not Currently    Frequency: 3.0 times per week    Types: Marijuana    Comment: not since found out preg      Allergies   Peanut-containing drug products, Shrimp [shellfish allergy], Watermelon flavor, and Lactase  Review of Systems Review of Systems Pertinent negatives listed in HPI   Physical Exam Triage Vital Signs ED Triage Vitals  Enc Vitals Group     BP 12/13/21 1838 109/72     Pulse Rate 12/13/21 1838 64     Resp 12/13/21 1840 18     Temp 12/13/21 1838 98.7 F (37.1 C)     Temp Source 12/13/21 1838 Oral     SpO2 12/13/21 1838 100 %     Weight --      Height --      Head Circumference --      Peak Flow --      Pain Score 12/13/21 1842 0     Pain Loc --      Pain Edu? --      Excl. in GC? --    No data found.  Updated Vital Signs BP 109/72 (BP Location: Left Arm)    Pulse 64    Temp 98.7 F (37.1  C) (Oral)    Resp 18    LMP 07/29/2021    SpO2 100%   Visual Acuity Right Eye Distance:   Left Eye Distance:   Bilateral Distance:    Right Eye Near:   Left Eye Near:    Bilateral Near:     Physical Exam General appearance: Alert, well developed, well nourished, cooperative  Head: Normocephalic, without obvious abnormality, atraumatic Respiratory: Respirations even and unlabored, normal respiratory rate Heart: Rate and rhythm normal.  Extremities: No gross deformities Skin: Skin color, texture, turgor normal. No rashes seen  Psych: Appropriate mood and affect. Neurologic: GCS 15, normal coordination and normal gait  UC Treatments / Results  Labs (all labs ordered are listed, but only abnormal results are displayed) Labs Reviewed  POCT URINALYSIS DIPSTICK, ED / UC - Abnormal; Notable for the following components:      Result Value   Leukocytes,Ua TRACE (*)    All other components within normal limits  CERVICOVAGINAL ANCILLARY ONLY    EKG   Radiology No results found.  Procedures Procedures (including critical care time)  Medications Ordered in UC Medications - No data to display  Initial Impression / Assessment and Plan / UC Course  I have  reviewed the triage vital signs and the nursing notes.  Pertinent labs & imaging results that were available during my care of the patient were reviewed by me and considered in my medical decision making (see chart for details).    Concern about STD without symptoms or known exposure. Cytology pending. No treatment today. Will notify if treatment is warranted. Final Clinical Impressions(s) / UC Diagnoses   Final diagnoses:  Concern about STD in female without diagnosis     Discharge Instructions      Vaginal cytology will result within 24 hours.  If any results are abnormal we will notify you by phone.     ED Prescriptions   None    PDMP not reviewed this encounter.   Bing Neighbors, FNP 12/13/21 2017

## 2021-12-13 NOTE — ED Triage Notes (Signed)
Pt presents for STD testing with no known symptoms.  

## 2021-12-13 NOTE — Discharge Instructions (Addendum)
Vaginal cytology will result within 24 hours.  If any results are abnormal we will notify you by phone.

## 2021-12-13 NOTE — ED Triage Notes (Signed)
Pt called on phone and said she was here and coming in but she wasn't in lobby when called and didn't see anyone attempting to come into building

## 2021-12-14 LAB — CERVICOVAGINAL ANCILLARY ONLY
Bacterial Vaginitis (gardnerella): POSITIVE — AB
Candida Glabrata: NEGATIVE
Candida Vaginitis: NEGATIVE
Chlamydia: NEGATIVE
Comment: NEGATIVE
Comment: NEGATIVE
Comment: NEGATIVE
Comment: NEGATIVE
Comment: NEGATIVE
Comment: NORMAL
Neisseria Gonorrhea: NEGATIVE
Trichomonas: NEGATIVE

## 2021-12-15 ENCOUNTER — Telehealth (HOSPITAL_COMMUNITY): Payer: Self-pay | Admitting: Emergency Medicine

## 2021-12-15 MED ORDER — METRONIDAZOLE 0.75 % VA GEL: 1.0000 | Freq: Every day | VAGINAL | 0 refills | Status: AC

## 2022-01-22 ENCOUNTER — Other Ambulatory Visit: Payer: Self-pay

## 2022-01-22 ENCOUNTER — Encounter (HOSPITAL_COMMUNITY): Payer: Self-pay

## 2022-01-22 ENCOUNTER — Ambulatory Visit (HOSPITAL_COMMUNITY)
Admission: EM | Admit: 2022-01-22 | Discharge: 2022-01-22 | Disposition: A | Discharge status: Home / Self Care | Payer: Medicaid Other | Attending: Family Medicine | Admitting: Family Medicine

## 2022-01-22 ENCOUNTER — Ambulatory Visit (HOSPITAL_COMMUNITY): Payer: Medicaid Other

## 2022-01-22 DIAGNOSIS — J029 Acute pharyngitis, unspecified: Secondary | ICD-10-CM | POA: Insufficient documentation

## 2022-01-22 DIAGNOSIS — Z113 Encounter for screening for infections with a predominantly sexual mode of transmission: Secondary | ICD-10-CM | POA: Diagnosis not present

## 2022-01-22 LAB — POCT RAPID STREP A, ED / UC: Streptococcus, Group A Screen (Direct): NEGATIVE

## 2022-01-22 NOTE — ED Provider Notes (Signed)
[facility]    CSN: [id number] Arrival date & time: 01/22/22  1742      History   Chief Complaint No chief complaint on file.   HPI Cassie Nguyen is a 26 y.o. female.   HPI Here with a sore throat and rhinorrhea since this morning.  No cough and no fever or chills.  No vomiting or diarrhea.  She also would like to check to make sure she does not have any STI. 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 Authorizing Provider  misoprostol (CYTOTEC) 200 MCG tablet Insert four tablets vaginally 11/02/21   Constant, Peggy, MD  Prenatal MV & Min w/FA-DHA (PRENATAL GUMMIES PO) Take 1 tablet by mouth daily.    [provider]  medroxyPROGESTERone (PROVERA) 10 MG tablet Take 1 tablet (10 mg total) by mouth daily. Patient not taking: Reported on 07/20/2019 06/25/19 02/26/20  Merrilee Jansky, MD    Family History Family History  Problem Relation Age of Onset   Cancer Mother     Social History Social History   Tobacco Use   Smoking status: Never   Smokeless tobacco: Never  Vaping Use   Vaping Use: Never used  Substance Use Topics   Alcohol use: Not Currently    Comment: occ, none since found out   Drug use: Not Currently    Frequency: 3.0 times per week     Types: Marijuana    Comment: not since found out preg     Allergies   Peanut-containing drug products, Shrimp [shellfish allergy], Watermelon flavor, and Lactase   Review of Systems Review of Systems   Physical Exam Triage Vital Signs ED Triage Vitals [01/22/22 1837]  Enc Vitals Group     BP 119/83     Pulse Rate 83     Resp 16     Temp 98.1 F (36.7 C)     Temp Source Oral     SpO2 100 %     Weight      Height      Head Circumference      Peak Flow      Pain Score      Pain Loc      Pain Edu?      Excl. in GC?    No data found.  Updated Vital Signs BP 119/83 (BP Location: Left Arm)    Pulse 83    Temp 98.1 F (36.7 C) (Oral)  Resp 16    LMP 01/19/2022    SpO2 100%    Breastfeeding Unknown   Visual Acuity Right Eye Distance:   Left Eye Distance:   Bilateral Distance:    Right Eye Near:   Left Eye Near:    Bilateral Near:     Physical Exam Vitals reviewed.  Constitutional:      General: She is not in acute distress.    Appearance: She is not toxic-appearing.  HENT:     Right Ear: Tympanic membrane and ear canal normal.     Left Ear: Tympanic membrane and ear canal normal.     Nose: Nose normal.     Mouth/Throat:     Mouth: Mucous membranes are moist.     Pharynx: No oropharyngeal exudate or posterior oropharyngeal erythema.  Eyes:     Extraocular Movements: Extraocular movements intact.     Conjunctiva/sclera: Conjunctivae normal.     Pupils: Pupils are equal, round, and reactive to light.  Cardiovascular:     Rate and Rhythm: Normal rate and regular rhythm.     Heart sounds: No murmur heard. Pulmonary:     Effort: Pulmonary effort is normal. No respiratory distress.     Breath sounds: No wheezing, rhonchi or rales.  Chest:     Chest wall: No tenderness.  Musculoskeletal:     Cervical back: Neck supple.  Lymphadenopathy:     Cervical: No cervical adenopathy.  Skin:    Capillary Refill: Capillary refill takes less than 2 seconds.      Coloration: Skin is not jaundiced or pale.  Neurological:     General: No focal deficit present.     Mental Status: She is alert and oriented to person, place, and time.  Psychiatric:        Behavior: Behavior normal.     UC Treatments / Results  Labs (all labs ordered are listed, but only abnormal results are displayed) Labs Reviewed  CULTURE, GROUP A STREP [facility])  POCT RAPID STREP A, ED / UC  CERVICOVAGINAL ANCILLARY ONLY    EKG   Radiology No results found.  Procedures Procedures (including critical care time)  Medications Ordered in UC Medications - No data to display  Initial Impression / Assessment and Plan / UC Course  I have reviewed the triage vital signs and the nursing notes.  Pertinent labs & imaging results that were available during my care of the patient were reviewed by me and considered in my medical decision making (see chart for details).     Rapid strep is negative.  Throat culture sent and self swab done.  We will call her with if any results are positive Final Clinical Impressions(s) / UC Diagnoses   Final diagnoses:  Acute pharyngitis, unspecified etiology     Discharge Instructions      Your strep test is negative.    Throat culture is sent to make sure that is correct.  Use cepacol spray as needed. You can also take tylenol or ibuprofen as needed for pain.  Our staff will call you if tests are positive, to send in treatment for you     ED Prescriptions   None    PDMP not reviewed this encounter.   Zenia Resides, MD 01/22/22 [phone]

## 2022-01-22 NOTE — ED Triage Notes (Signed)
Pt presents for sore throat that started today.

## 2022-01-22 NOTE — Discharge Instructions (Signed)
Your strep test is negative.    Throat culture is sent to make sure that is correct.  Use cepacol spray as needed. You can also take tylenol or ibuprofen as needed for pain.  Our staff will call you if tests are positive, to send in treatment for you

## 2022-01-23 ENCOUNTER — Telehealth (HOSPITAL_COMMUNITY): Payer: Self-pay | Admitting: Emergency Medicine

## 2022-01-23 LAB — CERVICOVAGINAL ANCILLARY ONLY
Bacterial Vaginitis (gardnerella): NEGATIVE
Candida Glabrata: NEGATIVE
Candida Vaginitis: POSITIVE — AB
Chlamydia: NEGATIVE
Comment: NEGATIVE
Comment: NEGATIVE
Comment: NEGATIVE
Comment: NEGATIVE
Comment: NEGATIVE
Comment: NORMAL
Neisseria Gonorrhea: NEGATIVE
Trichomonas: NEGATIVE

## 2022-01-23 MED ORDER — FLUCONAZOLE 150 MG PO TABS: 150.0000 mg | ORAL_TABLET | Freq: Once | ORAL | 0 refills | Status: AC

## 2022-01-25 LAB — CULTURE, GROUP A STREP (THRC)

## 2022-02-13 ENCOUNTER — Ambulatory Visit (HOSPITAL_COMMUNITY)
Admission: RE | Admit: 2022-02-13 | Discharge: 2022-02-13 | Disposition: A | Discharge status: Home / Self Care | Payer: Medicaid Other | Source: Ambulatory Visit | Attending: Emergency Medicine | Admitting: Emergency Medicine

## 2022-02-13 ENCOUNTER — Other Ambulatory Visit: Payer: Self-pay

## 2022-02-13 ENCOUNTER — Encounter (HOSPITAL_COMMUNITY): Payer: Self-pay

## 2022-02-13 VITALS — BP 105/70 | HR 91 | Temp 99.3°F | Resp 18

## 2022-02-13 DIAGNOSIS — Z113 Encounter for screening for infections with a predominantly sexual mode of transmission: Secondary | ICD-10-CM | POA: Diagnosis not present

## 2022-02-13 DIAGNOSIS — J029 Acute pharyngitis, unspecified: Secondary | ICD-10-CM | POA: Diagnosis not present

## 2022-02-13 LAB — POCT RAPID STREP A, ED / UC: Streptococcus, Group A Screen (Direct): NEGATIVE

## 2022-02-13 MED ORDER — AMOXICILLIN 500 MG PO CAPS: 500.0000 mg | ORAL_CAPSULE | Freq: Two times a day (BID) | ORAL | 0 refills | Status: AC

## 2022-02-13 MED ORDER — LIDOCAINE VISCOUS HCL 2 % MT SOLN: 15.0000 mL | OROMUCOSAL | 0 refills | Status: DC | PRN

## 2022-02-13 MED ORDER — IBUPROFEN 800 MG PO TABS: 800.0000 mg | ORAL_TABLET | Freq: Three times a day (TID) | ORAL | 0 refills | Status: DC

## 2022-02-13 NOTE — ED Triage Notes (Signed)
Pt presents with a sore throat for past few days.

## 2022-02-13 NOTE — Discharge Instructions (Addendum)
Your rapid strep test today was negative, your throat sample has been sent to the lab to see if it will grow bacteria, if this occurs you will be notified and antibiotic be sent in for use  You are being treated for strep based on your exam   In the meantime we must treat this as a viral symptom and manage the symptoms  You may gargle and spit lidocaine solution every 4 hours as needed for temporary relief of your sore throat  May use ibuprofen every 8 hours as needed in addition to Tylenol for additional comfort  You may follow-up at urgent care as needed  Labs pending 2-3 days, you will be contacted if positive for any sti and treatment will be sent to the pharmacy, you will have to return to the clinic if positive for gonorrhea to receive treatment   Please refrain from having sex until labs results, if positive please refrain from having sex until treatment complete and symptoms resolve   If positive for  Chlamydia  gonorrhea or trichomoniasis please notify partner or partners so they may tested as well  Moving forward, it is recommended you use some form of protection against the transmission of sti infections  such as condoms or dental dams with each sexual encounter

## 2022-02-13 NOTE — ED Provider Notes (Signed)
[facility]    CSN: [id number] Arrival date & time: 02/13/22  1802      History   Chief Complaint Chief Complaint  Patient presents with   APPOINTMENT: Sore Throat    HPI Cassie Nguyen is a 26 y.o. female.   Patient presents with nasal congestion, sore throat, nonproductive cough, generalized headache for 5 days.  Painful to swallow and endorses Edin Skarda patches on the back of her throat.  Unable to tolerate foods but tolerating liquids.  No known sick contacts.  Has not attempted treatment of symptoms.  History of asthma.  Requesting STD testing.  Denies all symptoms, no known exposure.  Sexually active, 2 female partners, no STI prevention used.   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 Authorizing Provider  misoprostol (CYTOTEC) 200 MCG tablet Insert four tablets vaginally 11/02/21   Constant, Peggy, MD  Prenatal MV & Min w/FA-DHA (PRENATAL GUMMIES PO) Take 1 tablet by mouth daily.    [provider]  medroxyPROGESTERone (PROVERA) 10 MG tablet Take 1 tablet (10 mg total) by mouth daily. Patient not taking: Reported on 07/20/2019 06/25/19 02/26/20  Merrilee Jansky, MD    Family History Family History  Problem Relation Age of Onset   Cancer Mother     Social  History Social History   Tobacco Use   Smoking status: Never   Smokeless tobacco: Never  Vaping Use   Vaping Use: Never used  Substance Use Topics   Alcohol use: Not Currently    Comment: occ, none since found out   Drug use: Not Currently    Frequency: 3.0 times per week    Types: Marijuana    Comment: not since found out preg     Allergies   Peanut-containing drug products, Shrimp [shellfish allergy], Watermelon flavor, and Lactase   Review of Systems Review of Systems  Constitutional: Negative.   HENT:  Positive for congestion and sore throat. Negative for dental problem, drooling, ear discharge, ear pain, facial swelling, hearing loss, mouth sores, nosebleeds, postnasal drip, rhinorrhea, sinus pressure, sinus pain, sneezing, tinnitus, trouble swallowing and voice change.   Respiratory:  Positive for cough. Negative for apnea, choking, chest tightness, shortness of breath, wheezing and stridor.   Cardiovascular: Negative.   Gastrointestinal: Negative.   Skin: Negative.   Neurological:  Positive for headaches. Negative for dizziness, tremors, seizures, syncope, facial asymmetry, speech difficulty, weakness, light-headedness and numbness.    Physical Exam Triage Vital Signs ED Triage Vitals  Enc  Vitals Group     BP 02/13/22 1820 105/70     Pulse Rate 02/13/22 1820 91     Resp 02/13/22 1820 18     Temp 02/13/22 1820 99.3 F (37.4 C)     Temp Source 02/13/22 1820 Oral     SpO2 02/13/22 1820 97 %     Weight --      Height --      Head Circumference --      Peak Flow --      Pain Score 02/13/22 1823 6     Pain Loc --      Pain Edu? --      Excl. in GC? --    No data found.  Updated Vital Signs BP 105/70 (BP Location: Left Arm)    Pulse 91    Temp 99.3 F (37.4 C) (Oral)    Resp 18    LMP 01/19/2022    SpO2 97%   Visual Acuity Right Eye Distance:   Left Eye Distance:   Bilateral Distance:    Right Eye Near:   Left Eye Near:    Bilateral Near:      Physical Exam Constitutional:      Appearance: Normal appearance.  HENT:     Head: Normocephalic.     Right Ear: Tympanic membrane, ear canal and external ear normal.     Left Ear: Tympanic membrane, ear canal and external ear normal.     Nose: Congestion present. No rhinorrhea.     Mouth/Throat:     Mouth: Mucous membranes are moist.     Pharynx: Posterior oropharyngeal erythema present.     Tonsils: Tonsillar exudate present. 0 on the right. 0 on the left.  Eyes:     Extraocular Movements: Extraocular movements intact.  Cardiovascular:     Rate and Rhythm: Normal rate and regular rhythm.     Pulses: Normal pulses.     Heart sounds: Normal heart sounds.  Pulmonary:     Effort: Pulmonary effort is normal.     Breath sounds: Normal breath sounds.  Genitourinary:    Comments: Deferred  Musculoskeletal:     Cervical back: Normal range of motion.  Lymphadenopathy:     Cervical: Cervical adenopathy present.  Neurological:     Mental Status: She is alert and oriented to person, place, and time. Mental status is at baseline.  Psychiatric:        Mood and Affect: Mood normal.        Behavior: Behavior normal.     UC Treatments / Results  Labs (all labs ordered are listed, but only abnormal results are displayed) Labs Reviewed  CULTURE, GROUP A STREP [facility])  POCT RAPID STREP A, ED / UC    EKG   Radiology No results found.  Procedures Procedures (including critical care time)  Medications Ordered in UC Medications - No data to display  Initial Impression / Assessment and Plan / UC Course  I have reviewed the triage vital signs and the nursing notes.  Pertinent labs & imaging results that were available during my care of the patient were reviewed by me and considered in my medical decision making (see chart for details).  Sore throat Routine screening for STI  Rapid strep test negative, sent for culture, will move forward with treatment based on exam,  amoxicillin 10-day course prescribed, lidocaine viscous and ibuprofen sent for additional comfort, may attempt salt water gargles, throat lozenges, warm liquids and honey for additional supportive  care, may follow-up with urgent care as needed  STI screening pending, declined HIV and syphilis testing, advised abstinence until all lab results and/or treatment is complete, recommended some form of STI prevention during all sexual encounters moving forward, given walking referral to to the [facility] per patient request Final Clinical Impressions(s) / UC Diagnoses   Final diagnoses:  None   Discharge Instructions   None    ED Prescriptions   None    PDMP not reviewed this encounter.   Valinda Hoar, [place] 02/13/22 1912

## 2022-02-14 LAB — CERVICOVAGINAL ANCILLARY ONLY
Bacterial Vaginitis (gardnerella): NEGATIVE
Candida Glabrata: NEGATIVE
Candida Vaginitis: NEGATIVE
Chlamydia: NEGATIVE
Comment: NEGATIVE
Comment: NEGATIVE
Comment: NEGATIVE
Comment: NEGATIVE
Comment: NEGATIVE
Comment: NORMAL
Neisseria Gonorrhea: NEGATIVE
Trichomonas: NEGATIVE

## 2022-02-16 LAB — CULTURE, GROUP A STREP (THRC)

## 2022-03-08 ENCOUNTER — Other Ambulatory Visit: Payer: Self-pay

## 2022-03-08 ENCOUNTER — Ambulatory Visit (HOSPITAL_COMMUNITY)
Admission: RE | Admit: 2022-03-08 | Discharge: 2022-03-08 | Disposition: A | Discharge status: Home / Self Care | Payer: Medicaid Other | Source: Ambulatory Visit | Attending: Emergency Medicine | Admitting: Emergency Medicine

## 2022-03-08 VITALS — BP 113/71 | HR 61 | Temp 98.0°F | Resp 18

## 2022-03-08 DIAGNOSIS — Z202 Contact with and (suspected) exposure to infections with a predominantly sexual mode of transmission: Secondary | ICD-10-CM

## 2022-03-08 DIAGNOSIS — Z113 Encounter for screening for infections with a predominantly sexual mode of transmission: Secondary | ICD-10-CM

## 2022-03-08 LAB — POCT URINALYSIS DIPSTICK, ED / UC
Bilirubin Urine: NEGATIVE
Glucose, UA: NEGATIVE mg/dL
Ketones, ur: NEGATIVE mg/dL
Nitrite: NEGATIVE
Protein, ur: NEGATIVE mg/dL
Specific Gravity, Urine: 1.025 (ref 1.005–1.030)
Urobilinogen, UA: 0.2 mg/dL (ref 0.0–1.0)
pH: 6 (ref 5.0–8.0)

## 2022-03-08 LAB — POC URINE PREG, ED: Preg Test, Ur: NEGATIVE

## 2022-03-08 NOTE — ED Triage Notes (Signed)
Pt presents for STD Testing with no known symptoms. 

## 2022-03-08 NOTE — ED Provider Notes (Signed)
[facility]    CSN: [id number] Arrival date & time: 03/08/22  1900    HISTORY   Chief Complaint  Patient presents with   APPOINTMENT: STD Testing    HPI Cassie Nguyen is a 26 y.o. female. Pt presents for STD Testing, denies vaginal discharge, vaginal itching, genital lesions, known STD exposure, pelvic pain, pelvic pressure, dyspareunia.  The history is provided by the patient.  Past Medical History:  Diagnosis Date   Asthma    Chlamydia    Gonorrhea    Trichomonas infection    Patient Active Problem List   Diagnosis Date Noted   ASC-H Pap smear 05/02/2021   Trichimoniasis 05/02/2021   Cervical dysplasia 07/27/2019   Abnormal uterine bleeding (AUB) 07/20/2019   Patellar subluxation 02/19/2012   Patellar tendon rupture 02/19/2012   Past Surgical History:  Procedure Laterality Date   ROOT CANAL     OB History     Gravida  1   Para  0   Term  0   Preterm  0   AB  0   Living  0      SAB  0   IAB  0   Ectopic  0   Multiple  0   Live Births  0          Home Medications    Prior to Admission medications   Medication Sig Start Date End Date Taking? Authorizing Provider   Family History Family History  Problem Relation Age of Onset   Cancer Mother    Social History Social History   Tobacco Use   Smoking status: Never   Smokeless tobacco: Never  Vaping Use   Vaping Use: Never used  Substance Use Topics   Alcohol use: Not Currently    Comment: occ, none since found out   Drug use: Not Currently    Frequency: 3.0 times per week    Types: Marijuana    Comment: not since found out preg   Allergies   Peanut-containing drug products, Shrimp [shellfish allergy], Watermelon flavor, and Lactase  Review of Systems Review of Systems Pertinent findings noted in history of present illness.   Physical Exam Triage Vital Signs ED Triage Vitals  Enc Vitals Group     BP 10/27/21 0827 (!) 147/82     Pulse Rate 10/27/21  0827 72     Resp 10/27/21 0827 18     Temp 10/27/21 0827 98.3 F (36.8 C)     Temp Source 10/27/21 0827 Oral     SpO2 10/27/21 0827 98 %     Weight --      Height --      Head Circumference --      Peak Flow --      Pain Score 10/27/21 0826 5     Pain Loc --      Pain Edu? --      Excl. in GC? --   No data found.  Updated Vital Signs BP 113/71 (BP Location: Left Arm)    Pulse 61    Temp 98 F (36.7 C) (Oral)    Resp 18    LMP 02/11/2022    SpO2 99%   Physical Exam Vitals and nursing note reviewed.  Constitutional:      General: She is not in acute distress.    Appearance: Normal appearance. She is not ill-appearing.  HENT:     Head: Normocephalic and atraumatic.  Eyes:     General: Lids  are normal.        Right eye: No discharge.        Left eye: No discharge.     Extraocular Movements: Extraocular movements intact.     Conjunctiva/sclera: Conjunctivae normal.     Right eye: Right conjunctiva is not injected.     Left eye: Left conjunctiva is not injected.  Neck:     Trachea: Trachea and phonation normal.  Cardiovascular:     Rate and Rhythm: Normal rate and regular rhythm.     Pulses: Normal pulses.     Heart sounds: Normal heart sounds. No murmur heard.   No friction rub. No gallop.  Pulmonary:     Effort: Pulmonary effort is normal. No accessory muscle usage, prolonged expiration or respiratory distress.     Breath sounds: Normal breath sounds. No stridor, decreased air movement or transmitted upper airway sounds. No decreased breath sounds, wheezing, rhonchi or rales.  Chest:     Chest wall: No tenderness.  Genitourinary:    Comments: Patient politely declines pelvic exam today, patient provided a vaginal swab for testing. Musculoskeletal:        General: Normal range of motion.     Cervical back: Normal range of motion and neck supple. Normal range of motion.  Lymphadenopathy:     Cervical: No cervical adenopathy.  Skin:    General: Skin is warm and dry.      Findings: No erythema or rash.  Neurological:     General: No focal deficit present.     Mental Status: She is alert and oriented to person, place, and time.  Psychiatric:        Mood and Affect: Mood normal.        Behavior: Behavior normal.    Visual Acuity Right Eye Distance:   Left Eye Distance:   Bilateral Distance:    Right Eye Near:   Left Eye Near:    Bilateral Near:     UC Couse / Diagnostics / Procedures:   Clinical Course as of 03/08/22 1945  Thu Mar 08, 2022  1944 Leukocytes,Ua(!): TRACE [LM]  1944 POCT urine pregnancy [LM]    Clinical Course User Index [LM] Theadora Rama Scales, PA-C   EKG  Radiology No results found.  Procedures Procedures (including critical care time)  UC Diagnoses / Final Clinical Impressions(s)   I have reviewed the triage vital signs and the nursing notes.  Pertinent labs & imaging results that were available during my care of the patient were reviewed by me and considered in my medical decision making (see chart for details).    Final diagnoses:  Screening for STDs (sexually transmitted diseases)   STD screening was performed, patient advised that the results be posted to their MyChart and if any of the results are positive, they will be notified by phone, further treatment will be provided as indicated based on results of STD screening. Return precautions advised.  Drug allergies reviewed, all questions addressed.   ED Prescriptions   None    PDMP not reviewed this encounter.  Pending results:  Labs Reviewed  POCT URINALYSIS DIPSTICK, ED / UC - Abnormal; Notable for the following components:      Result Value   Hgb urine dipstick TRACE (*)    Leukocytes,Ua TRACE (*)    All other components within normal limits  POCT URINALYSIS DIP (MANUAL ENTRY)  POCT URINE PREGNANCY  POC URINE PREG, ED  CERVICOVAGINAL ANCILLARY ONLY    Medications Ordered in UC:  Medications - No data to display  Disposition Upon Discharge:   Condition: stable for discharge home  Patient presented with concern for an acute illness with associated systemic symptoms and significant discomfort requiring urgent management. In my opinion, this is a condition that a prudent lay person (someone who possesses an average knowledge of health and medicine) may potentially expect to result in complications if not addressed urgently such as respiratory distress, impairment of bodily function or dysfunction of bodily organs.   As such, the patient has been evaluated and assessed, work-up was performed and treatment was provided in alignment with urgent care protocols and evidence based medicine.  Patient/parent/caregiver has been advised that the patient may require follow up for further testing and/or treatment if the symptoms continue in spite of treatment, as clinically indicated and appropriate.  Routine symptom specific, illness specific and/or disease specific instructions were discussed with the patient and/or caregiver at length.  Prevention strategies for avoiding STD exposure were also discussed.  The patient will follow up with their current PCP if and as advised. If the patient does not currently have a PCP we will assist them in obtaining one.   The patient may need specialty follow up if the symptoms continue, in spite of conservative treatment and management, for further workup, evaluation, consultation and treatment as clinically indicated and appropriate.  Patient/parent/caregiver verbalized understanding and agreement of plan as discussed.  All questions were addressed during visit.  Please see discharge instructions below for further details of plan.  Discharge Instructions:   Discharge Instructions      The results of your STD testing today will be made available to you once they are complete, this typically takes 3 to 5 days.  They will initially be posted to your MyChart and, if any of your results are abnormal, you will  receive a phone call with those results along with further instructions regarding treatment and any prescriptions, if needed.   Your urine pregnancy test today is negative, urinalysis is normal.   Thank you for visiting urgent care today.  I appreciate the opportunity to participate in your care.     This office note has been dictated using Teaching laboratory technician.  Unfortunately, and despite my best efforts, this method of dictation can sometimes lead to occasional typographical or grammatical errors.  I apologize in advance if this occurs.      Theadora Rama Scales, PA-C 03/08/22 1945

## 2022-03-08 NOTE — Discharge Instructions (Addendum)
The results of your STD testing today will be made available to you once they are complete, this typically takes 3 to 5 days.  They will initially be posted to your MyChart and, if any of your results are abnormal, you will receive a phone call with those results along with further instructions regarding treatment and any prescriptions, if needed.  ? ?Your urine pregnancy test today is negative, urinalysis is normal. ?  ?Thank you for visiting urgent care today.  I appreciate the opportunity to participate in your care. ? ?

## 2022-03-09 ENCOUNTER — Telehealth (HOSPITAL_COMMUNITY): Payer: Self-pay | Admitting: Emergency Medicine

## 2022-03-09 LAB — CERVICOVAGINAL ANCILLARY ONLY
Bacterial Vaginitis (gardnerella): NEGATIVE
Candida Glabrata: NEGATIVE
Candida Vaginitis: POSITIVE — AB
Chlamydia: NEGATIVE
Comment: NEGATIVE
Comment: NEGATIVE
Comment: NEGATIVE
Comment: NEGATIVE
Comment: NEGATIVE
Comment: NORMAL
Neisseria Gonorrhea: NEGATIVE
Trichomonas: NEGATIVE

## 2022-03-09 MED ORDER — FLUCONAZOLE 150 MG PO TABS: 150.0000 mg | ORAL_TABLET | Freq: Once | ORAL | 0 refills | Status: AC

## 2022-03-27 ENCOUNTER — Ambulatory Visit: Payer: Medicaid Other | Admitting: Critical Care Medicine

## 2022-04-05 ENCOUNTER — Ambulatory Visit: Payer: Medicaid Other | Admitting: Nurse Practitioner

## 2022-04-13 ENCOUNTER — Encounter (HOSPITAL_COMMUNITY): Payer: Self-pay

## 2022-04-13 ENCOUNTER — Ambulatory Visit (HOSPITAL_COMMUNITY)
Admission: RE | Admit: 2022-04-13 | Discharge: 2022-04-13 | Disposition: A | Discharge status: Home / Self Care | Payer: Medicaid Other | Source: Ambulatory Visit | Attending: Nurse Practitioner | Admitting: Nurse Practitioner

## 2022-04-13 VITALS — BP 107/70 | HR 66 | Temp 98.1°F | Resp 17 | Ht 63.0 in | Wt 129.0 lb

## 2022-04-13 DIAGNOSIS — Z202 Contact with and (suspected) exposure to infections with a predominantly sexual mode of transmission: Secondary | ICD-10-CM | POA: Insufficient documentation

## 2022-04-13 DIAGNOSIS — Z113 Encounter for screening for infections with a predominantly sexual mode of transmission: Secondary | ICD-10-CM | POA: Diagnosis present

## 2022-04-13 LAB — POCT URINALYSIS DIPSTICK, ED / UC
Bilirubin Urine: NEGATIVE
Glucose, UA: NEGATIVE mg/dL
Hgb urine dipstick: NEGATIVE
Leukocytes,Ua: NEGATIVE
Nitrite: NEGATIVE
Protein, ur: NEGATIVE mg/dL
Specific Gravity, Urine: 1.025 (ref 1.005–1.030)
Urobilinogen, UA: 0.2 mg/dL (ref 0.0–1.0)
pH: 5.5 (ref 5.0–8.0)

## 2022-04-13 LAB — POC URINE PREG, ED: Preg Test, Ur: NEGATIVE

## 2022-04-13 NOTE — ED Triage Notes (Signed)
Pt presents to UC for STD testing. Denies any symptoms at this time.  ?

## 2022-04-13 NOTE — ED Provider Notes (Signed)
?[facility] ? ? ? ?CSN: [id number] ?Arrival date & time: 04/13/22  1900 ? ? ?  ? ?History   ?Chief Complaint ?Chief Complaint  ?Patient presents with  ? Exposure to STD  ? ? ?HPI ?Cassie Nguyen is a 26 y.o. female.  ? ?States thatThe patient is a 26 year old female who presents for STI testing.  She has had intermittent vaginal discharge that looks like "mucus", but denies vaginal odor, vaginal itching, urinary frequency, urgency, hesitancy, or abdominal pain.  Her last menstrual cycle was 03/14/2022.  She states that she has had 1 female partner in the past 90 days.  She informs that her current partner had "messed" with her old partner and then started seeing the patient again. The patient states, her partner's previous partner informed they should get STI testing. She reports a previous history of chlamydia and gonorrhea. ? ?The history is provided by the patient.  ? ?Past Medical History:  ?Diagnosis Date  ? Asthma   ? Chlamydia   ? Gonorrhea   ? Trichomonas infection   ? ? ?Patient Active Problem List  ? Diagnosis Date Noted  ? ASC-H Pap smear 05/02/2021  ? Trichimoniasis 05/02/2021  ? Cervical dysplasia 07/27/2019  ? Abnormal uterine bleeding (AUB) 07/20/2019  ? Patellar subluxation 02/19/2012  ? Patellar tendon rupture 02/19/2012  ? ? ?Past Surgical History:  ?Procedure Laterality Date  ? ROOT CANAL    ? ? ?OB History   ? ? Gravida  ?1  ? Para  ?0  ? Term  ?0  ? Preterm  ?0  ? AB  ?0  ? Living  ?0  ?  ? ? SAB  ?0  ? IAB  ?0  ? Ectopic  ?0  ? Multiple  ?0  ? Live Births  ?0  ?   ?  ?  ? ? ? ?Home Medications   ? ?Prior to Admission medications   ?Medication Sig Start Date End Date Taking? Authorizing Provider  ?medroxyPROGESTERone (PROVERA) 10 MG tablet Take 1 tablet (10 mg total) by mouth daily. ?Patient not taking: Reported on 07/20/2019 06/25/19 02/26/20  Merrilee Jansky, MD  ? ? ?Family History ?Family History  ?Problem Relation Age of Onset  ? Cancer Mother   ? ? ?Social History ?Social  History  ? ?Tobacco Use  ? Smoking status: Never  ? Smokeless tobacco: Never  ?Vaping Use  ? Vaping Use: Never used  ?Substance Use Topics  ? Alcohol use: Not Currently  ?  Comment: occ, none since found out  ? Drug use: Not Currently  ?  Frequency: 3.0 times per week  ?  Types: Marijuana  ?  Comment: not since found out preg  ? ? ? ?Allergies   ?Peanut-containing drug products, Shrimp [shellfish allergy], Watermelon flavor, and Lactase ? ? ?Review of Systems ?Review of Systems  ?Constitutional: Negative.   ?Gastrointestinal: Negative.   ?Genitourinary:  Positive for vaginal discharge. Negative for dysuria, flank pain, frequency, hematuria, menstrual problem, vaginal bleeding and vaginal pain.  ?Skin: Negative.   ?Psychiatric/Behavioral: Negative.    ? ? ?Physical Exam ?Triage Vital Signs ?ED Triage Vitals [04/13/22 1907]  ?Enc Vitals Group  ?   BP 107/70  ?   Pulse Rate 66  ?   Resp 17  ?   Temp 98.1 ?F (36.7 ?C)  ?   Temp Source Oral  ?   SpO2 98 %  ?   Weight 128 lb 15.5 oz (58.5 kg)  ?  Height 5\' 3"  (1.6 m)  ?   Head Circumference   ?   Peak Flow   ?   Pain Score 0  ?   Pain Loc   ?   Pain Edu?   ?   Excl. in GC?   ? ?No data found. ? ?Updated Vital Signs ?BP 107/70 (BP Location: Left Arm)   Pulse 66   Temp 98.1 ?F (36.7 ?C) (Oral)   Resp 17   Ht 5\' 3"  (1.6 m)   Wt 128 lb 15.5 oz (58.5 kg)   LMP 07/29/2021   SpO2 98%   BMI 22.85 kg/m?  ? ?Visual Acuity ?Right Eye Distance:   ?Left Eye Distance:   ?Bilateral Distance:   ? ?Right Eye Near:   ?Left Eye Near:    ?Bilateral Near:    ? ?Physical Exam ?Vitals reviewed.  ?Constitutional:   ?   Appearance: Normal appearance.  ?Pulmonary:  ?   Effort: Pulmonary effort is normal.  ?   Breath sounds: Normal breath sounds.  ?Abdominal:  ?   General: Bowel sounds are normal.  ?   Palpations: Abdomen is soft.  ?Neurological:  ?   General: No focal deficit present.  ?   Mental Status: She is alert and oriented to person, place, and time.  ?Psychiatric:     ?   Mood and  Affect: Mood normal.     ?   Behavior: Behavior normal.  ? ? ? ?UC Treatments / Results  ?Labs ?(all labs ordered are listed, but only abnormal results are displayed) ?Labs Reviewed  ?POCT URINALYSIS DIPSTICK, ED / UC - Abnormal; Notable for the following components:  ?    Result Value  ? Ketones, ur TRACE (*)   ? All other components within normal limits  ?POC URINE PREG, ED  ?CERVICOVAGINAL ANCILLARY ONLY  ? ? ?EKG ? ? ?Radiology ?No results found. ? ?Procedures ?Procedures (including critical care time) ? ?Medications Ordered in UC ?Medications - No data to display ? ?Initial Impression / Assessment and Plan / UC Course  ?I have reviewed the triage vital signs and the nursing notes. ? ?Pertinent labs & imaging results that were available during my care of the patient were reviewed by me and considered in my medical decision making (see chart for details). ? ?The patient is a 26 year old female who presents for STI testing.  She is having STI testing done per her current partner's previous partner, as her current partner in the current partners previous partner had sexual intercourse.  The patient states that she has noticed some "vaginal mucus" but no other symptoms at this time.  Cytology was collected, patient advised that she will be contacted if the results are positive.  Her urinalysis and urine pregnancy were negative.  Patient encouraged to continue to limit her number of sexual partners.  Follow-up as needed. ?Final Clinical Impressions(s) / UC Diagnoses  ? ?Final diagnoses:  ?Possible exposure to STD  ?Screening examination for sexually transmitted disease  ? ? ? ?Discharge Instructions   ? ?  ?Your urinalysis and urine pregnancy test are negative for any indications of urinary tract infection. ?You will be contacted if your cytology results are positive to provide treatment. ?Continue to limit your number of sexual partners. ?Follow-up as needed. ? ? ? ? ?ED Prescriptions   ?None ?  ? ?PDMP not reviewed  this encounter. ?  ?07/31/2021, NP ?04/13/22 1934 ? ?

## 2022-04-13 NOTE — Discharge Instructions (Addendum)
Your urinalysis and urine pregnancy test are negative for any indications of urinary tract infection. ?You will be contacted if your cytology results are positive to provide treatment. ?Continue to limit your number of sexual partners. ?Follow-up as needed. ?

## 2022-04-16 LAB — CERVICOVAGINAL ANCILLARY ONLY
Bacterial Vaginitis (gardnerella): POSITIVE — AB
Candida Glabrata: NEGATIVE
Candida Vaginitis: NEGATIVE
Chlamydia: NEGATIVE
Comment: NEGATIVE
Comment: NEGATIVE
Comment: NEGATIVE
Comment: NEGATIVE
Comment: NEGATIVE
Comment: NORMAL
Neisseria Gonorrhea: NEGATIVE
Trichomonas: NEGATIVE

## 2022-04-17 ENCOUNTER — Telehealth (HOSPITAL_COMMUNITY): Payer: Self-pay | Admitting: Emergency Medicine

## 2022-04-17 MED ORDER — METRONIDAZOLE 0.75 % VA GEL: 1.0000 | Freq: Every day | VAGINAL | 0 refills | Status: AC

## 2022-05-10 ENCOUNTER — Ambulatory Visit (HOSPITAL_COMMUNITY)
Admission: RE | Admit: 2022-05-10 | Discharge: 2022-05-10 | Disposition: A | Discharge status: Home / Self Care | Payer: Medicaid Other | Source: Ambulatory Visit | Attending: Family Medicine | Admitting: Family Medicine

## 2022-05-10 VITALS — BP 108/69 | HR 80 | Temp 98.3°F | Resp 16

## 2022-05-10 DIAGNOSIS — J4521 Mild intermittent asthma with (acute) exacerbation: Secondary | ICD-10-CM | POA: Insufficient documentation

## 2022-05-10 DIAGNOSIS — R0602 Shortness of breath: Secondary | ICD-10-CM | POA: Diagnosis present

## 2022-05-10 DIAGNOSIS — Z113 Encounter for screening for infections with a predominantly sexual mode of transmission: Secondary | ICD-10-CM | POA: Diagnosis not present

## 2022-05-10 MED ORDER — ALBUTEROL SULFATE HFA 108 (90 BASE) MCG/ACT IN AERS: 1.0000 | INHALATION_SPRAY | Freq: Four times a day (QID) | RESPIRATORY_TRACT | 1 refills | Status: DC | PRN

## 2022-05-10 NOTE — ED Triage Notes (Signed)
Pt reports feeling like she was wheezing and "having trouble breathing"; states she felt like it may have been R/T fumes at work; states used her inhaler, but there wasn't any improvement with it. States dyspnea lasted throughout last week, then completely resolved approx 5 days ago. ? ?Also wishes to be checked for STDs. Denies any sxs or discharge. ?

## 2022-05-10 NOTE — ED Provider Notes (Signed)
?[facility] ? ? ?[id number] ?05/10/22 Arrival Time: 1902 ? ?ASSESSMENT & PLAN: ? ?1. Screening for STD (sexually transmitted disease)   ?2. SOB (shortness of breath)   ?3. Mild intermittent asthma with exacerbation   ? ? ? ?Discharge Instructions   ? ?  ?We have sent testing for sexually transmitted infections. We will notify you of any positive results once they are received. If required, we will prescribe any medications you might need. ? ?Please refrain from all sexual activity for at least the next seven days. ? ? ? ? ?Without s/s of PID. No empiric treatment. ? ?Labs Reviewed  ?CERVICOVAGINAL ANCILLARY ONLY  ? ?Will notify of any positive results. Instructed to refrain from sexual activity for at least seven days. ?Declines HIV/RPR. ? ?Asthma exacerbation has resolved. Lungs CTAB. No resp distress. ?Meds ordered this encounter  ?Medications  ? albuterol (VENTOLIN HFA) 108 (90 Base) MCG/ACT inhaler  ?  Sig: Inhale 1-2 puffs into the lungs every 6 (six) hours as needed for wheezing or shortness of breath.  ?  Dispense:  1 each  ?  Refill:  1  ? ? ?Reviewed expectations re: course of current medical issues. Questions answered. ?Outlined signs and symptoms indicating need for more acute intervention. ?Patient verbalized understanding. ?After Visit Summary given. ? ? ?SUBJECTIVE: ? ?Cassie Nguyen is a 26 y.o. female who requests STD screening. New female sexual partner. No symptoms. No abdominal or pelvic pain. Normal PO intake wihout n/v. No genital rashes or lesions. Reports that  ? ?Patient's last menstrual period was 04/22/2022 (exact date). ? ?Also reports wheezing/asthma exac several d ago. Used inhaler. Resolved. Currently no SOB. ? ?OBJECTIVE: ? ?Vitals:  ? 05/10/22 1917  ?BP: 108/69  ?Pulse: 80  ?Resp: 16  ?Temp: 98.3 ?F (36.8 ?C)  ?TempSrc: Oral  ?SpO2: 98%  ?  ? ?General appearance: alert, cooperative, appears stated age and no distress ?Lungs: unlabored respirations; speaks full  sentences without difficulty ?Back: no CVA tenderness; FROM at waist ?Lungs: CTAB ?Abdomen: soft, non-tender ?GU: deferred ?Skin: warm and dry ?Psychological: alert and cooperative; normal mood and affect. ? ? ? ?Labs Reviewed  ?CERVICOVAGINAL ANCILLARY ONLY  ? ? ?Allergies  ?Allergen Reactions  ? Peanut-Containing Drug Products Anaphylaxis  ? Shrimp [Shellfish Allergy] Anaphylaxis  ?  Makes throat itchy and dry  ? Watermelon Flavor Anaphylaxis  ? Lactase Other (See Comments)  ?  Constipation, gi upset  ? ? ?Past Medical History:  ?Diagnosis Date  ? Asthma   ? Chlamydia   ? Gonorrhea   ? Trichomonas infection   ? ?Family History  ?Problem Relation Age of Onset  ? Cancer Mother   ? ?Social History  ? ?Socioeconomic History  ? Marital status: Single  ?  Spouse name: Not on file  ? Number of children: Not on file  ? Years of education: Not on file  ? Highest education level: Not on file  ?Occupational History  ? Not on file  ?Tobacco Use  ? Smoking status: Never  ? Smokeless tobacco: Never  ?Vaping Use  ? Vaping Use: Never used  ?Substance and Sexual Activity  ? Alcohol use: Not Currently  ?  Comment: occasionally  ? Drug use: Yes  ?  Types: Marijuana  ? Sexual activity: Yes  ?  Birth control/protection: Condom  ?Other Topics Concern  ? Not on file  ?Social History Narrative  ? Not on file  ? ?Social Determinants of Health  ? ?Financial Resource Strain: Not  on file  ?Food Insecurity: Not on file  ?Transportation Needs: Not on file  ?Physical Activity: Not on file  ?Stress: Not on file  ?Social Connections: Not on file  ?Intimate Partner Violence: Not on file  ? ? ? ? ? ? ? ?  ?Mardella Layman, MD ?05/10/22 1943 ? ?

## 2022-05-10 NOTE — Discharge Instructions (Signed)
We have sent testing for sexually transmitted infections. We will notify you of any positive results once they are received. If required, we will prescribe any medications you might need.  Please refrain from all sexual activity for at least the next seven days.  

## 2022-05-11 LAB — CERVICOVAGINAL ANCILLARY ONLY
Bacterial Vaginitis (gardnerella): NEGATIVE
Candida Glabrata: NEGATIVE
Candida Vaginitis: NEGATIVE
Chlamydia: NEGATIVE
Comment: NEGATIVE
Comment: NEGATIVE
Comment: NEGATIVE
Comment: NEGATIVE
Comment: NEGATIVE
Comment: NORMAL
Neisseria Gonorrhea: NEGATIVE
Trichomonas: NEGATIVE

## 2022-05-30 ENCOUNTER — Ambulatory Visit (HOSPITAL_COMMUNITY): Payer: Self-pay

## 2022-06-04 IMAGING — US US OB < 14 WEEKS - US OB TV
1 series · 15 of 28 positions shown · non-contrast
Comparison: None.

CLINICAL DATA: Abdominal cramping.  Early pregnancy.

EXAM:
OBSTETRIC <14 WK US AND TRANSVAGINAL OB US
TECHNIQUE: Both transabdominal and transvaginal ultrasound examinations were
performed for complete evaluation of the gestation as well as the
maternal uterus, adnexal regions, and pelvic cul-de-sac.
Transvaginal technique was performed to assess early pregnancy.

[Series 1: us ob < 14 weeks - us ob tv · 15 of 44 slices shown]
[im 1/44]
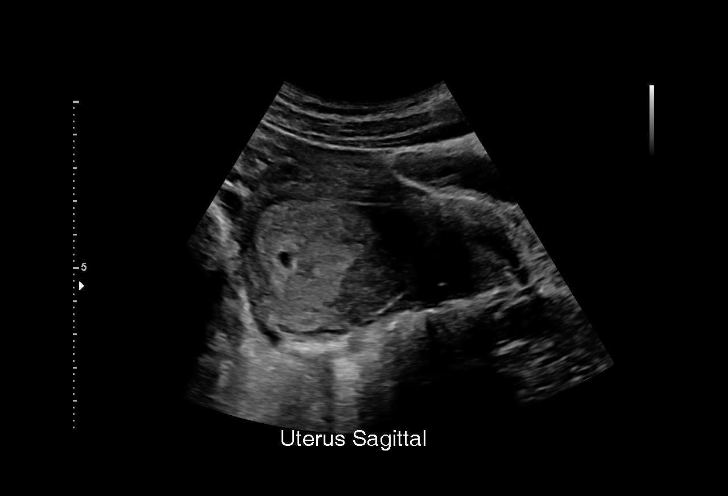
[im 4/44]
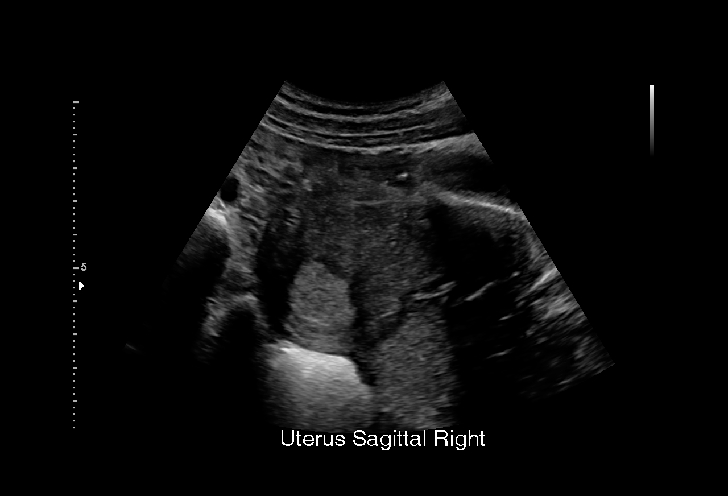
[im 7/44]
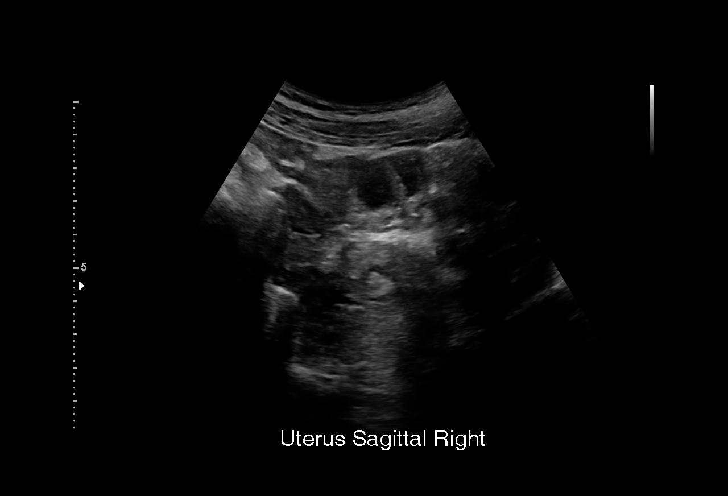
[im 10/44]
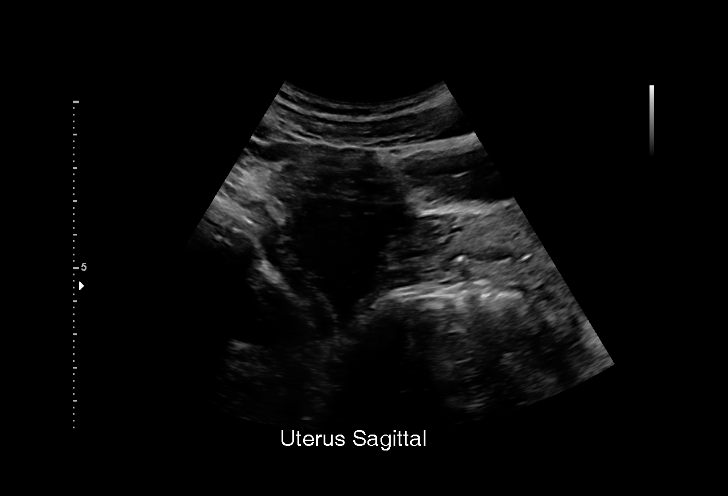
[im 13/44]
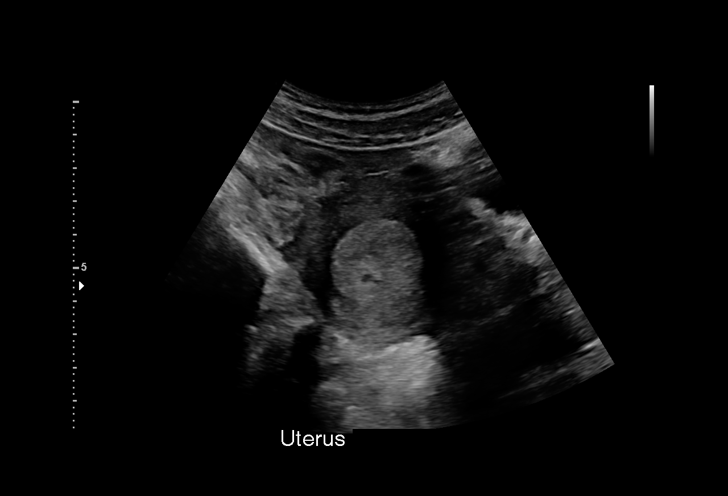
[im 16/44]
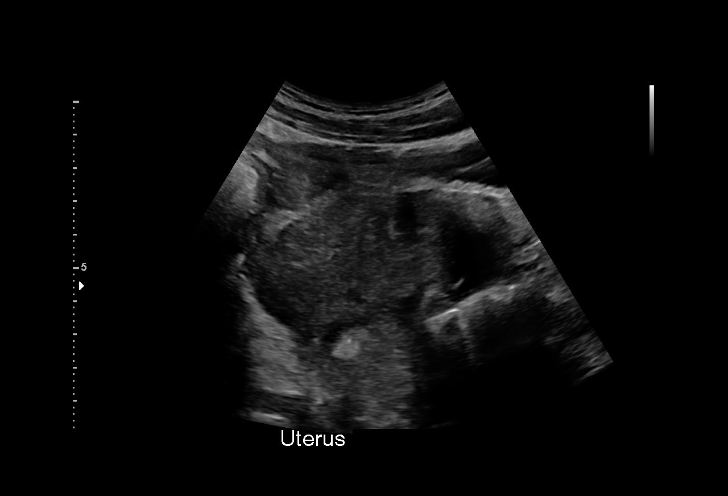
[im 20/44]
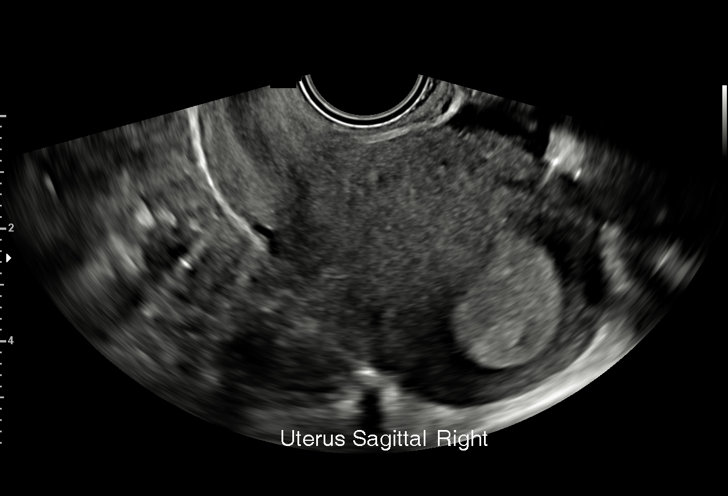
[im 23/44]
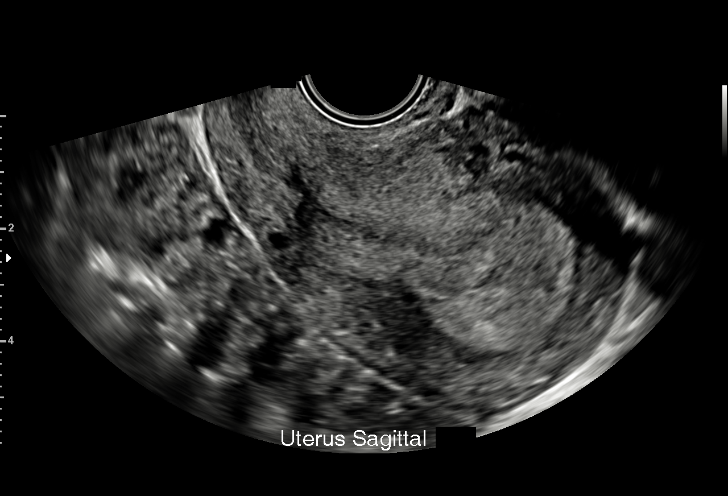
[im 24/44]
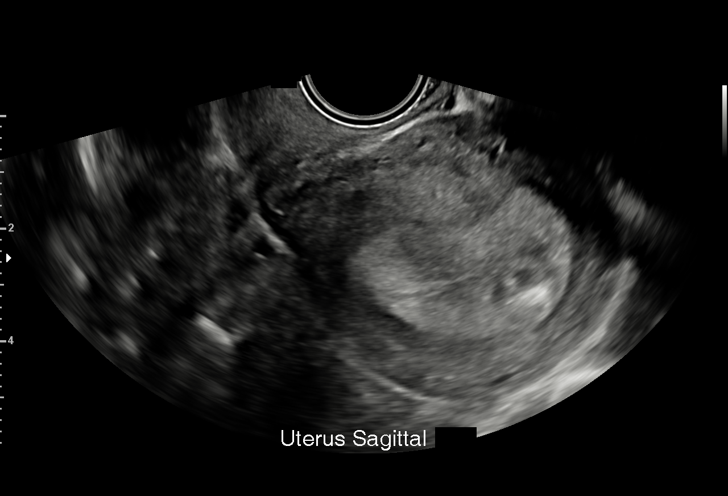
[im 28/44]
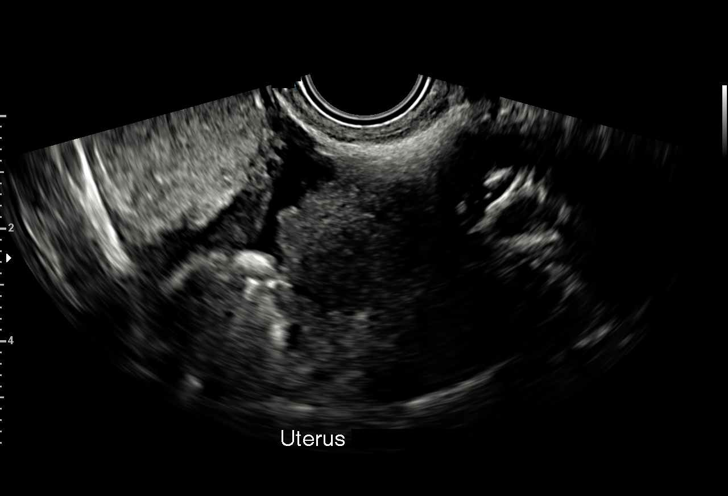
[im 31/44]
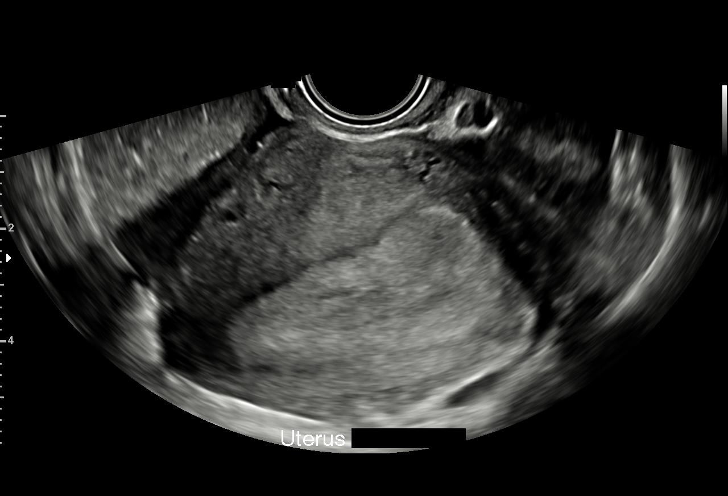
[im 34/44]
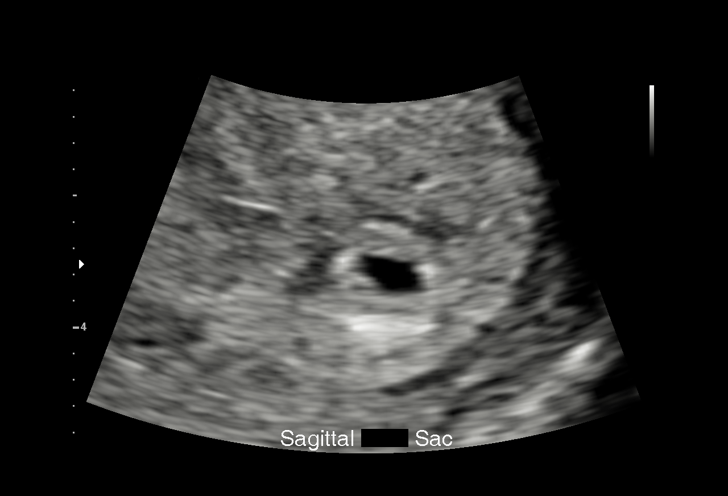
[im 37/44]
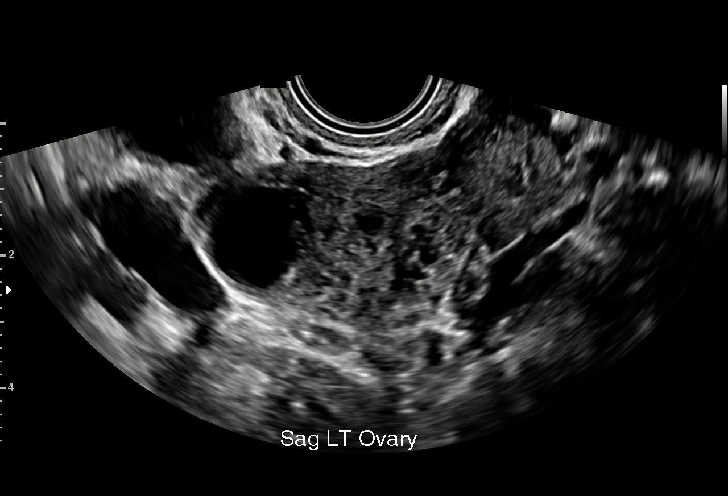
[im 40/44]
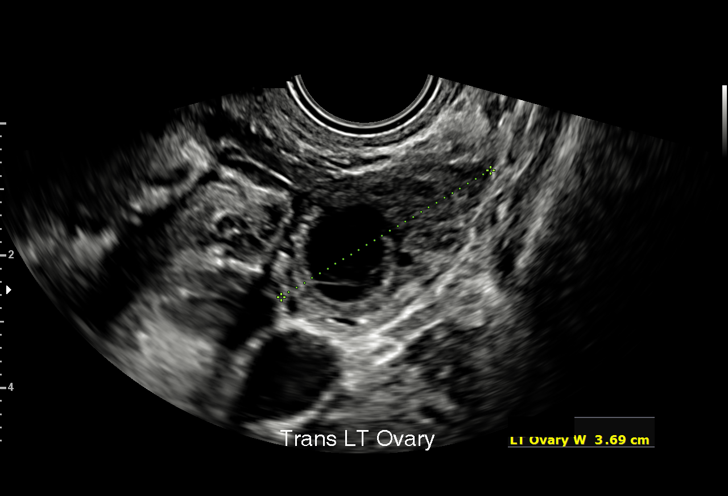
[im 44/44]
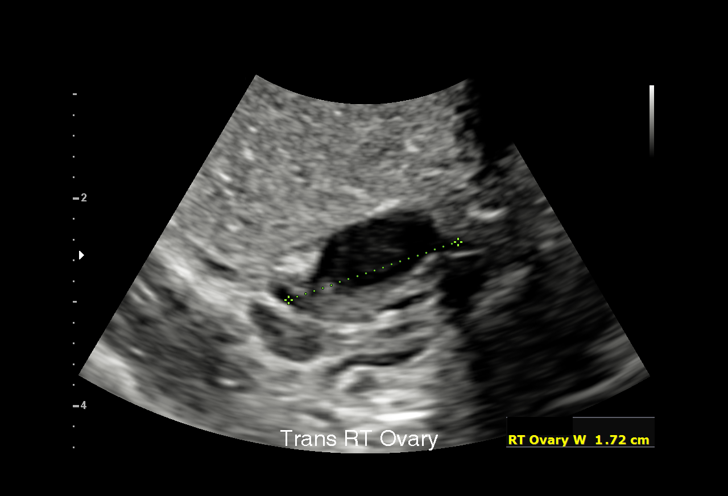

[15 of 28 positions shown; findings below may reference images not displayed]

FINDINGS: Intrauterine gestational sac: Present

Yolk sac:  Absent

Embryo:  Absent

Cardiac Activity: N/A

MSD: 4.7 mm   5 w   1 d

Subchorionic hemorrhage:  None visualized.

Maternal uterus/adnexae: Retroverted orientation of the uterus
(considered normal in the first trimester, but abnormal if this
persists substantially into the second trimester). Mildly complex
1.6 cm lesion of the left ovary is likely a corpus luteum of
pregnancy, with the left ovary measuring 4.7 by 2.5 by 3.7 cm. There
is also a 1.1 by 0.6 cm mildly complex cystic lesion of the right
ovary. The right ovary measures 2.0 by 1.2 by 1.7 cm.
IMPRESSION: 1. Single intrauterine gestational sac without visible embryo or
yolk sac at this time. Probable early intrauterine gestational sac,
but no yolk sac, fetal pole, or cardiac activity yet visualized.
Recommend follow-up quantitative B-HCG levels and follow-up US in 14
days to assess viability. This recommendation follows SRU consensus
guidelines: Diagnostic Criteria for Nonviable Pregnancy Early in the
First Trimester. N Engl J Med 6443; [DATE].

## 2022-06-06 IMAGING — US US OB TRANSVAGINAL
1 series · 14 of 28 positions shown · non-contrast
Comparison: Obstetrical ultrasound 09/15/2021.
COMPARISON: Obstetrical ultrasound 09/15/2021.

Addendum:
CLINICAL DATA: Abdominal cramping.

EXAM:
OBSTETRIC <14 WK ULTRASOUND
TECHNIQUE: Transabdominal ultrasound was performed for evaluation of the
gestation as well as the maternal uterus and adnexal regions.

[Series 1: us ob transvaginal · 74 acquisitions, 14 frames shown]
[im 3/74]
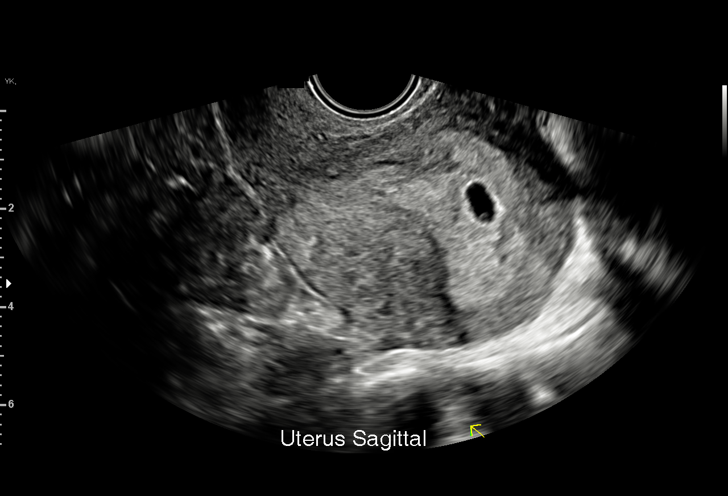
[im 9/74]
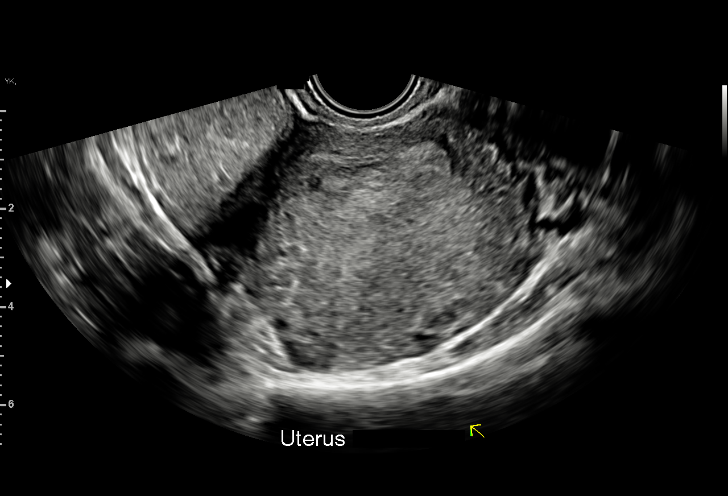
[im 14/74]
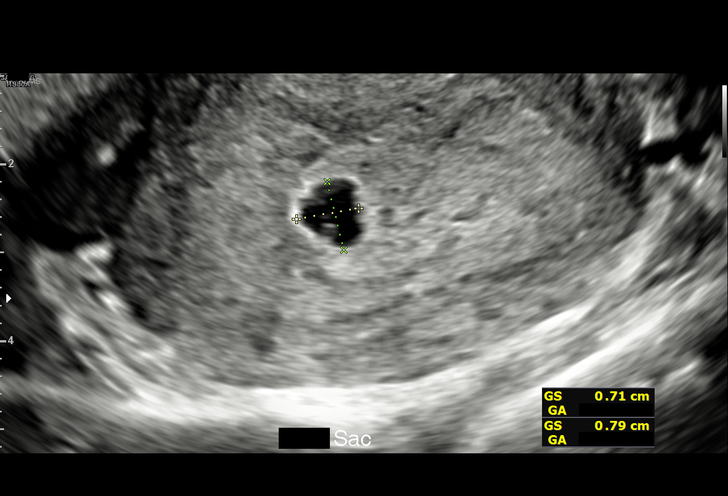
[im 19/74]
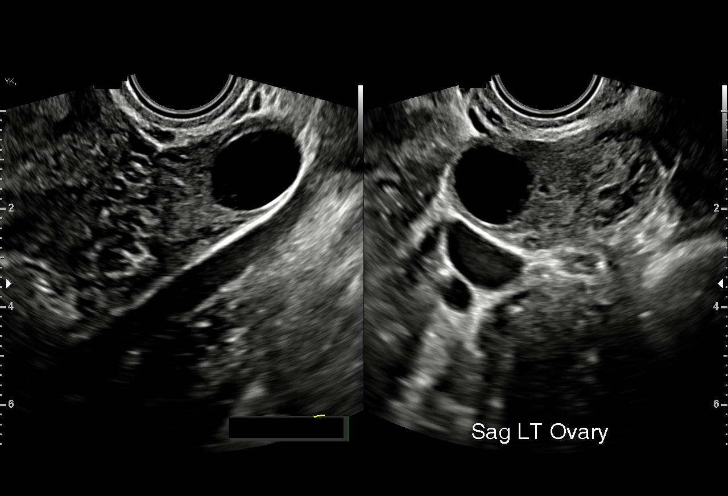
[im 25/74]
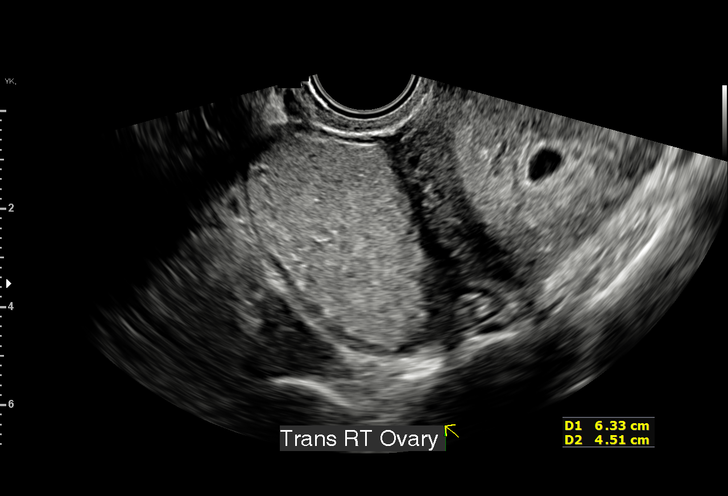
[im 30/74]
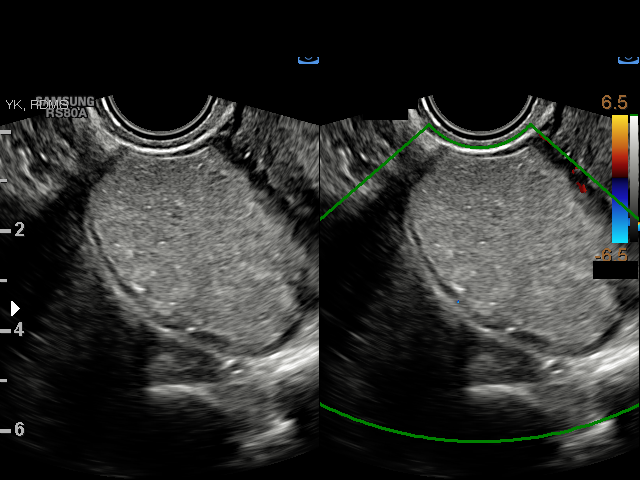
[im 36/74]
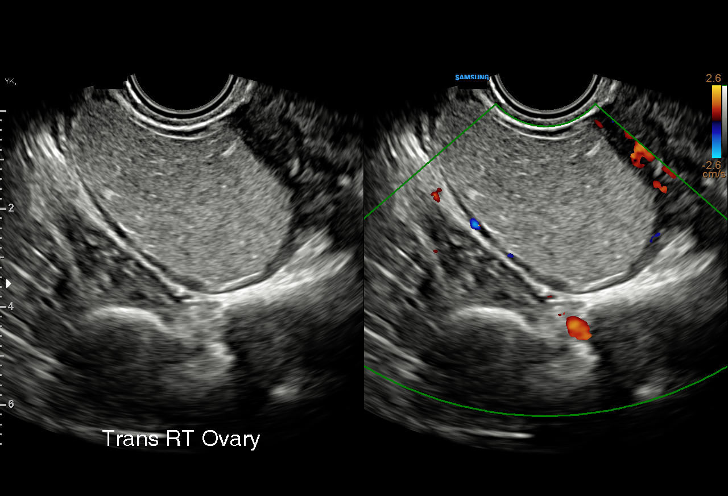
[im 41/74]
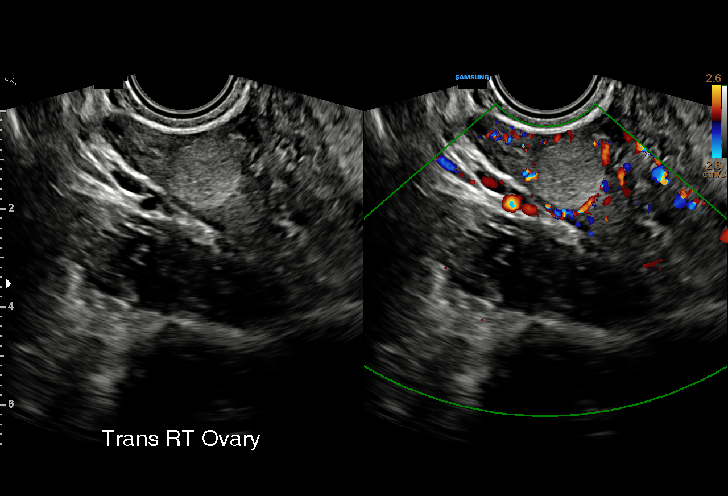
[im 46/74]
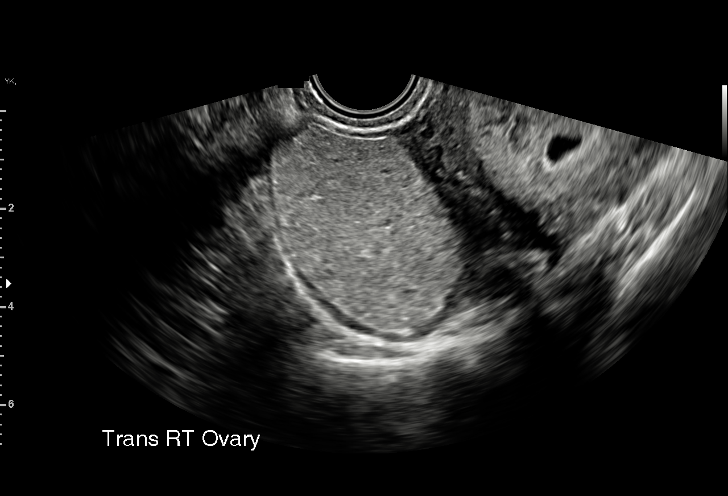
[im 52/74]
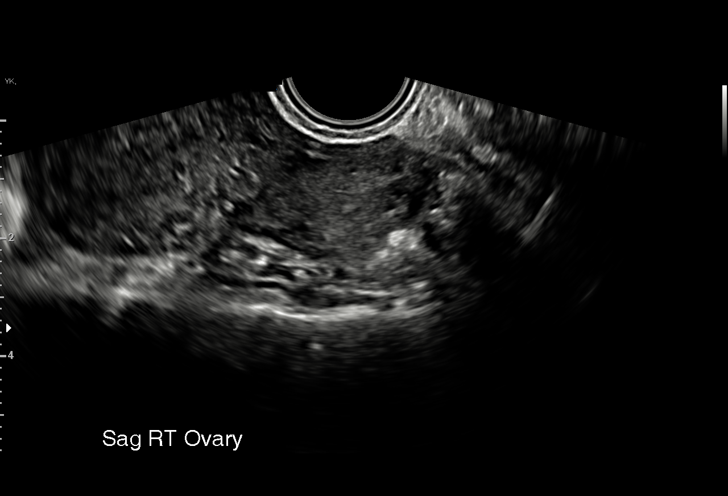
[im 57/74]
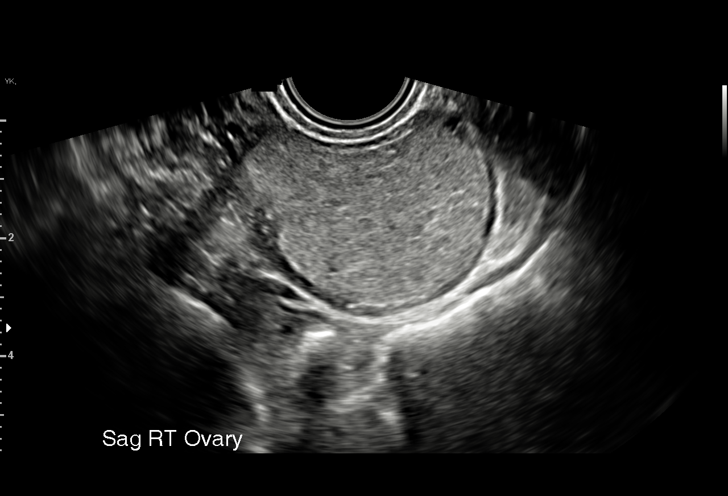
[im 63/74]
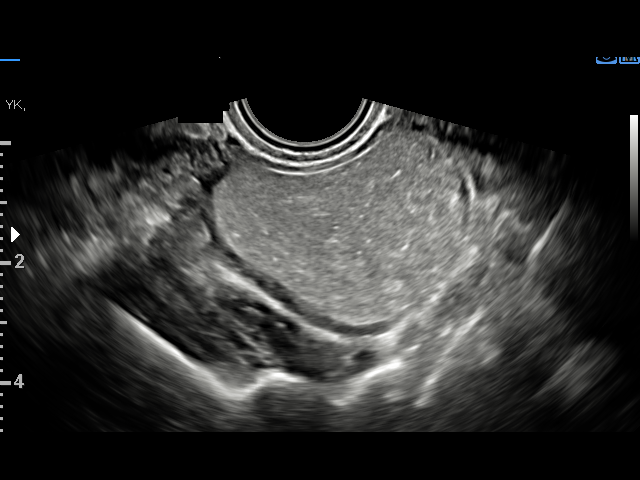
[im 68/74]
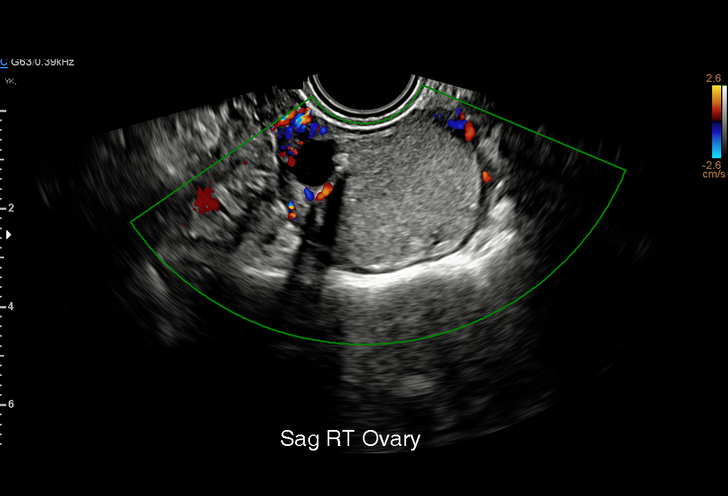
[im 74/74]
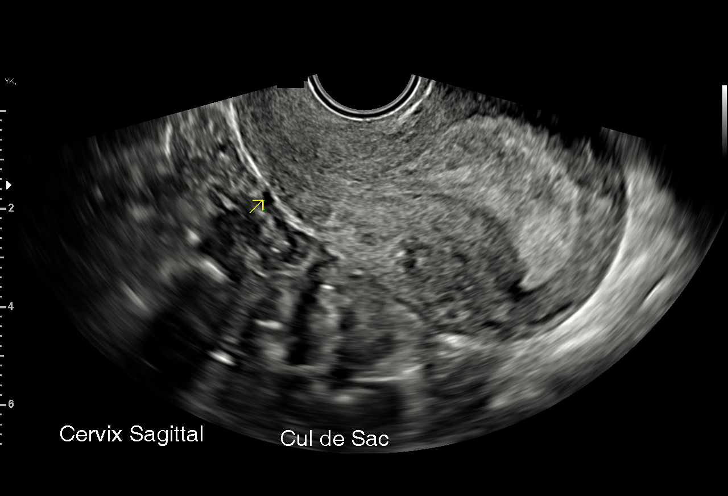

[14 of 28 positions shown; findings below may reference images not displayed]

FINDINGS: Intrauterine gestational sac: Single

Yolk sac:  Visualized.

Embryo:  Not Visualized.

MSD:  6.6 mm   5 w   2 d

Subchorionic hemorrhage:  None visualized.

Maternal uterus/adnexae:

Left ovary: Corpus luteum cyst measuring 2 cm. Otherwise within
normal limits.

Right ovary: Enlarged measuring 6.4 x 4.5 x 3.5 cm. It contains a
smooth bordered, ovoid, homogeneous mildly echogenic mass without
vascularity. Punctate echogenic foci within the mass, likely
calcifications. Measures 5.0 x 3.4 x 3.0 cm.
IMPRESSION: 1. Single intrauterine gestational sac measuring 5 weeks 2 days by
mean gestational sac diameter. A yolk sac is visualized, but embryo
is not visualized. Probable early intrauterine gestational sac, but
no yolk sac, fetal pole, or cardiac activity yet visualized.
Recommend follow-up quantitative B-HCG levels and follow-up US in 14
days to assess viability. This recommendation follows SRU consensus
guidelines: Diagnostic Criteria for Nonviable Pregnancy Early in the
First Trimester. N Engl J Med 1007; [DATE].
2. 5.0 cm right ovarian mass, indeterminate. Findings may be related
to fibroma thecoma, [place] tumor or other solid mass. Follow-up
imaging with MRI should be considered when clinically appropriate.
3. These results were called by telephone at the time of
who verbally acknowledged these results.

ADDENDUM:
Correction to
IMPRESSION: 1. Single intrauterine gestational sac measuring 5 weeks 2 days by
mean gestational sac diameter. A yolk sac is visualized, but embryo
is not visualized. Early intrauterine gestational sac, but no fetal
pole, or cardiac activity yet visualized. Recommend follow-up
quantitative B-HCG levels and follow-up US in 14 days to assess
viability. This recommendation follows SRU consensus guidelines:
Diagnostic Criteria for Nonviable Pregnancy Early in the First
Trimester. N Engl J Med 1007; [DATE].

2. 5.0 cm right ovarian mass, indeterminate. Findings may be related
to fibroma thecoma, [place] tumor or other solid mass. Follow-up
imaging with MRI should be considered when clinically appropriate.

*These results were called by telephone at the time of
who verbally acknowledged these results.

*** End of Addendum ***
FINDINGS: Intrauterine gestational sac: Single

Yolk sac:  Visualized.

Embryo:  Not Visualized.

MSD:  6.6 mm   5 w   2 d

Subchorionic hemorrhage:  None visualized.

Maternal uterus/adnexae:

Left ovary: Corpus luteum cyst measuring 2 cm. Otherwise within
normal limits.

Right ovary: Enlarged measuring 6.4 x 4.5 x 3.5 cm. It contains a
smooth bordered, ovoid, homogeneous mildly echogenic mass without
vascularity. Punctate echogenic foci within the mass, likely
calcifications. Measures 5.0 x 3.4 x 3.0 cm.
IMPRESSION: 1. Single intrauterine gestational sac measuring 5 weeks 2 days by
mean gestational sac diameter. A yolk sac is visualized, but embryo
is not visualized. Probable early intrauterine gestational sac, but
no yolk sac, fetal pole, or cardiac activity yet visualized.
Recommend follow-up quantitative B-HCG levels and follow-up US in 14
days to assess viability. This recommendation follows SRU consensus
guidelines: Diagnostic Criteria for Nonviable Pregnancy Early in the
First Trimester. N Engl J Med 1007; [DATE].
2. 5.0 cm right ovarian mass, indeterminate. Findings may be related
to fibroma thecoma, [place] tumor or other solid mass. Follow-up
imaging with MRI should be considered when clinically appropriate.
3. These results were called by telephone at the time of
who verbally acknowledged these results.

## 2022-06-11 ENCOUNTER — Ambulatory Visit (HOSPITAL_COMMUNITY)
Admission: EM | Admit: 2022-06-11 | Discharge: 2022-06-11 | Disposition: A | Discharge status: Home / Self Care | Payer: Medicaid Other | Attending: Emergency Medicine | Admitting: Emergency Medicine

## 2022-06-11 ENCOUNTER — Encounter (HOSPITAL_COMMUNITY): Payer: Self-pay

## 2022-06-11 DIAGNOSIS — Z202 Contact with and (suspected) exposure to infections with a predominantly sexual mode of transmission: Secondary | ICD-10-CM

## 2022-06-11 DIAGNOSIS — Z113 Encounter for screening for infections with a predominantly sexual mode of transmission: Secondary | ICD-10-CM | POA: Insufficient documentation

## 2022-06-11 NOTE — Discharge Instructions (Addendum)
Please refrain from sexual intercourse until your results return.  You will receive your results via MyChart.  If anything returns positive we will treat you as needed.

## 2022-06-11 NOTE — ED Provider Notes (Signed)
[facility]    CSN: [id number] Arrival date & time: 06/11/22  1945      History   Chief Complaint Chief Complaint  Patient presents with   SEXUALLY TRANSMITTED DISEASE    HPI Cassie Nguyen is a 26 y.o. female.  Presents for STD testing.  No known exposures.  She is asymptomatic, no vaginal discharge, itching, urinary symptoms, dysuria, rash.  She is sexually active and occasionally uses protection. Declines blood testing today.  Additionally reports her tongue feels weird for the past few days. Feels "yeasty".  No recent antibiotic use.  She sometimes brushes her tongue but never gets the back.  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 Authorizing Provider  albuterol (VENTOLIN HFA) 108 (90 Base) MCG/ACT inhaler Inhale 1-2 puffs into the lungs every 6 (six) hours as needed for wheezing or shortness of breath. 05/10/22  Yes Hagler, Arlys John, MD  medroxyPROGESTERone (PROVERA) 10 MG tablet Take 1 tablet (10 mg total) by mouth daily. Patient not taking: Reported on 07/20/2019 06/25/19 02/26/20  Merrilee Jansky, MD    Family History Family History  Problem Relation Age of Onset   Cancer Mother     Social History Social History   Tobacco Use   Smoking status: Never   Smokeless tobacco:  Never  Vaping Use   Vaping Use: Never used  Substance Use Topics   Alcohol use: Not Currently    Comment: occasionally   Drug use: Yes    Types: Marijuana     Allergies   Peanut-containing drug products, Shrimp [shellfish allergy], Watermelon flavor, and Lactase   Review of Systems Review of Systems  Per HPI  Physical Exam Triage Vital Signs ED Triage Vitals  Enc Vitals Group     BP 06/11/22 2100 102/68     Pulse Rate 06/11/22 2100 84     Resp 06/11/22 2100 20     Temp 06/11/22 2100 98.4 F (36.9 C)     Temp src --      SpO2 06/11/22 2100 95 %     Weight --      Height --      Head Circumference --      Peak Flow --      Pain Score 06/11/22 2101 0     Pain Loc --      Pain Edu? --      Excl. in GC? --    No data found.  Updated Vital Signs BP 102/68 (BP Location:  Right Arm)   Pulse 84   Temp 98.4 F (36.9 C)   Resp 20   LMP 04/30/2022 (Approximate)   SpO2 95%    Physical Exam HENT:     Mouth/Throat:     Mouth: Mucous membranes are moist. No oral lesions.     Dentition: Normal dentition. No gingival swelling or gum lesions.     Tongue: No lesions.     Pharynx: Oropharynx is clear. Uvula midline. No posterior oropharyngeal erythema.     Tonsils: No tonsillar exudate.     Comments: Tongue appears normal, no signs of yeast or rash Cardiovascular:     Rate and Rhythm: Normal rate and regular rhythm.     Heart sounds: Normal heart sounds.  Pulmonary:     Effort: Pulmonary effort is normal.     Breath sounds: Normal breath sounds.  Abdominal:     Tenderness: There is no abdominal tenderness.     UC Treatments / Results  Labs (all labs ordered are listed, but only abnormal results are displayed) Labs Reviewed  CERVICOVAGINAL ANCILLARY ONLY    EKG  Radiology No results found.  Procedures Procedures (including critical care time)  Medications Ordered in UC Medications - No data to display  Initial Impression / Assessment and Plan / UC  Course  I have reviewed the triage vital signs and the nursing notes.  Pertinent labs & imaging results that were available during my care of the patient were reviewed by me and considered in my medical decision making (see chart for details).  Vaginal swab pending at this time.  Patient understands her results will come to MyChart.  We will also contact her if anything returns positive and requires treatment.  Discussed safe sex practices with patient and recommend that she abstain from sexual intercourse until results return.  Patient agrees to plan and is discharged in stable condition.  Final Clinical Impressions(s) / UC Diagnoses   Final diagnoses:  Screen for STD (sexually transmitted disease)     Discharge Instructions      Please refrain from sexual intercourse until your results return.  You will receive your results via MyChart.  If anything returns positive we will treat you as needed.     ED Prescriptions   None    PDMP not reviewed this encounter.   Jacquline Terrill, Ray Church 06/11/22 2114

## 2022-06-11 NOTE — ED Triage Notes (Signed)
Pt presents for STI testing.  No known exposure and no s/s but this is just to be on the safe side.  Asked if she had unprotected sex and she states kinda.  Feels like her tongue isn't as pink as it should be and tongue feels weird when she swallows.

## 2022-06-12 LAB — CERVICOVAGINAL ANCILLARY ONLY
Bacterial Vaginitis (gardnerella): NEGATIVE
Candida Glabrata: NEGATIVE
Candida Vaginitis: POSITIVE — AB
Chlamydia: NEGATIVE
Comment: NEGATIVE
Comment: NEGATIVE
Comment: NEGATIVE
Comment: NEGATIVE
Comment: NEGATIVE
Comment: NORMAL
Neisseria Gonorrhea: NEGATIVE
Trichomonas: NEGATIVE

## 2022-06-14 ENCOUNTER — Telehealth (HOSPITAL_COMMUNITY): Payer: Self-pay | Admitting: Emergency Medicine

## 2022-06-14 MED ORDER — FLUCONAZOLE 150 MG PO TABS: 150.0000 mg | ORAL_TABLET | Freq: Once | ORAL | 0 refills | Status: AC

## 2022-07-11 ENCOUNTER — Ambulatory Visit: Payer: Medicaid Other | Attending: Critical Care Medicine | Admitting: Family Medicine

## 2022-07-11 ENCOUNTER — Encounter: Payer: Self-pay | Admitting: Family Medicine

## 2022-07-11 ENCOUNTER — Other Ambulatory Visit (HOSPITAL_COMMUNITY)
Admission: RE | Admit: 2022-07-11 | Discharge: 2022-07-11 | Disposition: A | Discharge status: Home / Self Care | Payer: Medicaid Other | Source: Ambulatory Visit | Attending: Critical Care Medicine | Admitting: Critical Care Medicine

## 2022-07-11 VITALS — BP 109/67 | HR 75 | Temp 98.1°F | Ht 63.0 in | Wt 129.6 lb

## 2022-07-11 DIAGNOSIS — Z13228 Encounter for screening for other metabolic disorders: Secondary | ICD-10-CM | POA: Diagnosis not present

## 2022-07-11 DIAGNOSIS — D649 Anemia, unspecified: Secondary | ICD-10-CM

## 2022-07-11 DIAGNOSIS — Z124 Encounter for screening for malignant neoplasm of cervix: Secondary | ICD-10-CM

## 2022-07-11 DIAGNOSIS — Z113 Encounter for screening for infections with a predominantly sexual mode of transmission: Secondary | ICD-10-CM

## 2022-07-11 DIAGNOSIS — Z0000 Encounter for general adult medical examination without abnormal findings: Secondary | ICD-10-CM | POA: Diagnosis not present

## 2022-07-11 DIAGNOSIS — Z1159 Encounter for screening for other viral diseases: Secondary | ICD-10-CM

## 2022-07-11 NOTE — Patient Instructions (Signed)

## 2022-07-11 NOTE — Progress Notes (Signed)
Subjective:  Patient ID: Cassie Nguyen, female    DOB: July 13, 1996  Age: 27 y.o. MRN: [id number]  CC: New Patient (Initial Visit)   HPI Cassie Nguyen is a 25 y.o. year old female who presents today to establish care and for annual physical. 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  Interval History: She exercises regularly by means of walking and tries to incorporate vegetables and fruit into her diet. Does not see a dentist ophthalmologist regularly. Past Medical History:  Diagnosis Date   Asthma    Chlamydia    Gonorrhea    Trichomonas infection     Past Surgical History:  Procedure Laterality Date   ROOT CANAL      Family History  Problem Relation Age of Onset   Cancer Mother     Social History   Socioeconomic History   Marital status: Single    Spouse name: Not on file   Number of children: Not on file   Years of education: Not on file   Highest education level: Not on file  Occupational History   Not on file  Tobacco Use   Smoking status: Never   Smokeless tobacco: Never  Vaping Use   Vaping Use: Never used  Substance and Sexual Activity   Alcohol use: Not Currently    Comment: occasionally   Drug use: Yes    Types: Marijuana   Sexual activity: Yes    Birth control/protection: Condom  Other Topics Concern   Not on file  Social History Narrative   Not on file   Social Determinants of Health   Financial Resource Strain: Not on file  Food Insecurity: No Food Insecurity (05/02/2021)   Hunger Vital Sign    Worried About Running Out of Food in the Last Year: Never true    Ran Out of Food in the Last Year: Never true  Transportation Needs: No Transportation Needs (05/02/2021)   PRAPARE - Hydrologist (Medical): No    Lack of Transportation (Non-Medical): No  Physical Activity: Not on file  Stress: Not on file  Social Connections: Not on file    Allergies  Allergen Reactions   Peanut-Containing  Drug Products Anaphylaxis   Shrimp [Shellfish Allergy] Anaphylaxis    Makes throat itchy and dry   Watermelon Flavor Anaphylaxis   Lactase Other (See Comments)    Constipation, gi upset    Outpatient Medications Prior to Visit  Medication Sig Dispense Refill   albuterol (VENTOLIN HFA) 108 (90 Base) MCG/ACT inhaler Inhale 1-2 puffs into the lungs every 6 (six) hours as needed for wheezing or shortness of breath. (Patient not taking: Reported on 07/11/2022) 1 each 1   No facility-administered medications prior to visit.     ROS Review of Systems  Constitutional:  Negative for activity change, appetite change and fatigue.  HENT:  Negative for congestion, sinus pressure and sore throat.   Eyes:  Negative for visual disturbance.  Respiratory:  Negative for cough, chest tightness, shortness of breath and wheezing.   Cardiovascular:  Negative for chest pain and palpitations.  Gastrointestinal:  Negative for abdominal distention, abdominal pain and constipation.  Endocrine: Negative for polydipsia.  Genitourinary:  Negative for dysuria and frequency.  Musculoskeletal:  Negative for arthralgias and back pain.  Skin:  Negative for rash.  Neurological:  Negative for tremors, light-headedness and numbness.  Hematological:  Does not bruise/bleed easily.  Psychiatric/Behavioral:  Negative for agitation and behavioral problems.  Objective:  BP 109/67   Pulse 75   Temp 98.1 F (36.7 C) (Oral)   Ht '5\' 3"'  (1.6 m)   Wt 129 lb 9.6 oz (58.8 kg)   LMP 04/30/2022 (Approximate)   SpO2 100%   BMI 22.96 kg/m      07/11/2022    9:19 AM 06/11/2022    9:00 PM 05/10/2022    7:17 PM  BP/Weight  Systolic BP 098 119 147  Diastolic BP 67 68 69  Wt. (Lbs) 129.6    BMI 22.96 kg/m2        Physical Exam Exam conducted with a chaperone present.  Constitutional:      General: She is not in acute distress.    Appearance: She is well-developed. She is not diaphoretic.  HENT:     Head:  Normocephalic.     Right Ear: External ear normal.     Left Ear: External ear normal.     Nose: Nose normal.  Eyes:     Conjunctiva/sclera: Conjunctivae normal.     Pupils: Pupils are equal, round, and reactive to light.  Neck:     Vascular: No JVD.  Cardiovascular:     Rate and Rhythm: Normal rate and regular rhythm.     Heart sounds: Normal heart sounds. No murmur heard.    No gallop.  Pulmonary:     Effort: Pulmonary effort is normal. No respiratory distress.     Breath sounds: Normal breath sounds. No wheezing or rales.  Chest:     Chest wall: No tenderness.  Breasts:    Right: Normal. No mass, nipple discharge or tenderness.     Left: Normal. No mass, nipple discharge or tenderness.  Abdominal:     General: Bowel sounds are normal. There is no distension.     Palpations: Abdomen is soft. There is no mass.     Tenderness: There is no abdominal tenderness.     Hernia: There is no hernia in the left inguinal area or right inguinal area.  Genitourinary:    General: Normal vulva.     Pubic Area: No rash.      Labia:        Right: No rash.        Left: No rash.      Vagina: Normal.     Cervix: Cervical bleeding present.     Uterus: Normal.      Adnexa: Right adnexa normal and left adnexa normal.       Right: No tenderness.         Left: No tenderness.    Musculoskeletal:        General: No tenderness. Normal range of motion.     Cervical back: Normal range of motion. No tenderness.  Lymphadenopathy:     Upper Body:     Right upper body: No supraclavicular or axillary adenopathy.     Left upper body: No supraclavicular or axillary adenopathy.  Skin:    General: Skin is warm and dry.  Neurological:     Mental Status: She is alert and oriented to person, place, and time.     Deep Tendon Reflexes: Reflexes are normal and symmetric.        Latest Ref Rng & Units 10/02/2021    8:43 PM 09/17/2021    5:10 PM  CMP  Glucose 70 - 99 mg/dL 95  105   BUN 6 - 20 mg/dL 6  8    Creatinine 0.44 - 1.00 mg/dL 0.63  0.73   Sodium 135 - 145 mmol/L 138  135   Potassium 3.5 - 5.1 mmol/L 4.1  3.7   Chloride 98 - 111 mmol/L 106  104   CO2 22 - 32 mmol/L 28  23   Calcium 8.9 - 10.3 mg/dL 9.6  9.7   Total Protein 6.5 - 8.1 g/dL  7.0   Total Bilirubin 0.3 - 1.2 mg/dL  0.8   Alkaline Phos 38 - 126 U/L  43   AST 15 - 41 U/L  19   ALT 0 - 44 U/L  11     Lipid Panel  No results found for: "CHOL", "TRIG", "HDL", "CHOLHDL", "VLDL", "LDLCALC", "LDLDIRECT"  CBC    Component Value Date/Time   WBC 5.8 10/02/2021 2043   RBC 3.49 (L) 10/02/2021 2043   HGB 11.0 (L) 10/02/2021 2043   HCT 32.0 (L) 10/02/2021 2043   PLT 307 10/02/2021 2043   MCV 91.7 10/02/2021 2043   MCH 31.5 10/02/2021 2043   MCHC 34.4 10/02/2021 2043   RDW 12.5 10/02/2021 2043   LYMPHSABS 1.4 10/02/2021 2043   MONOABS 0.5 10/02/2021 2043   EOSABS 0.1 10/02/2021 2043   BASOSABS 0.0 10/02/2021 2043    No results found for: "HGBA1C"  Assessment & Plan:  1. Annual physical exam Counseled on 150 minutes of exercise per week, healthy eating (including decreased daily intake of saturated fats, cholesterol, added sugars, sodium), STI prevention, routine healthcare maintenance.   2. Screening for cervical cancer Pap from last year revealed ASCUS, HPV positive - Cytology - PAP  3. Screening for STD (sexually transmitted disease) - Cervicovaginal ancillary only  4. Screening for metabolic disorder - [id number]  5. Screening for viral disease - HCV Ab w Reflex to Quant PCR  6. Anemia, unspecified type Last hemoglobin was 11.0 - CBC with Differential/Platelet   No orders of the defined types were placed in this encounter.   Follow-up: Return in about 1 year (around 07/12/2023) for [place], MD, FAAFP. [facility] [place], [place]   07/11/2022, 9:55 AM

## 2022-07-11 NOTE — Progress Notes (Signed)
Discuss last pap smear. Std Screening

## 2022-07-12 LAB — CERVICOVAGINAL ANCILLARY ONLY
Bacterial Vaginitis (gardnerella): NEGATIVE
Candida Glabrata: NEGATIVE
Candida Vaginitis: NEGATIVE
Chlamydia: NEGATIVE
Comment: NEGATIVE
Comment: NEGATIVE
Comment: NEGATIVE
Comment: NEGATIVE
Comment: NEGATIVE
Comment: NORMAL
Neisseria Gonorrhea: NEGATIVE
Trichomonas: NEGATIVE

## 2022-07-15 IMAGING — US US OB TRANSVAGINAL
1 series · 15 of 28 positions shown · non-contrast
Comparison: 10/12/2021

CLINICAL DATA: First trimester pregnancy, unable to establish
viability; LMP 07/29/2021; quantitative beta HCG 32,384 on
10/02/2021

EXAM:
TRANSVAGINAL OB ULTRASOUND
TECHNIQUE: Transvaginal ultrasound was performed for complete evaluation of the
gestation as well as the maternal uterus, adnexal regions, and
pelvic cul-de-sac.

[Series 1: us ob transvaginal · 15 of 46 slices shown]
[im 1/46]
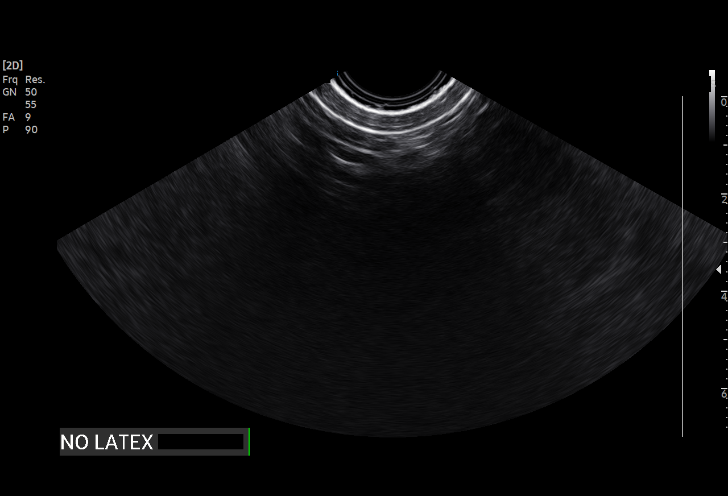
[im 4/46]
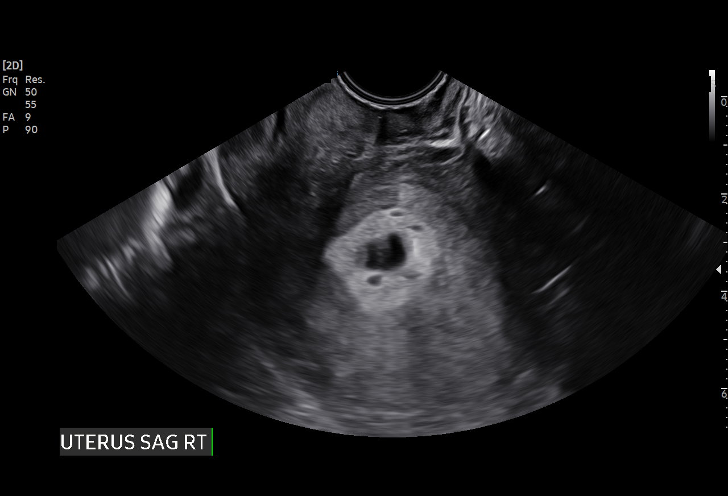
[im 7/46]
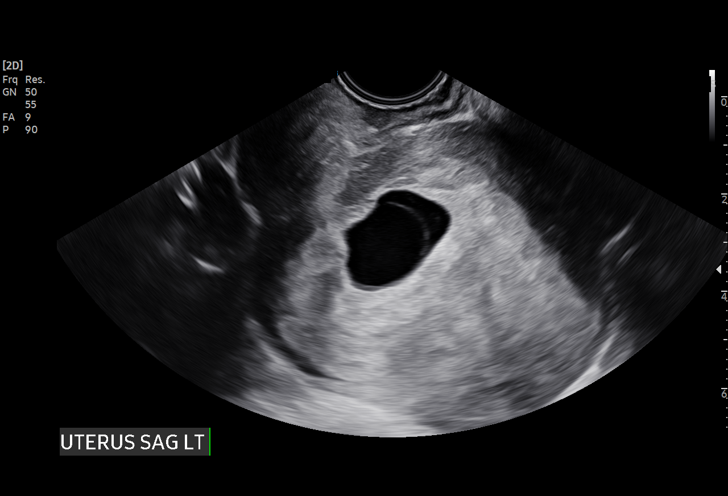
[im 11/46]
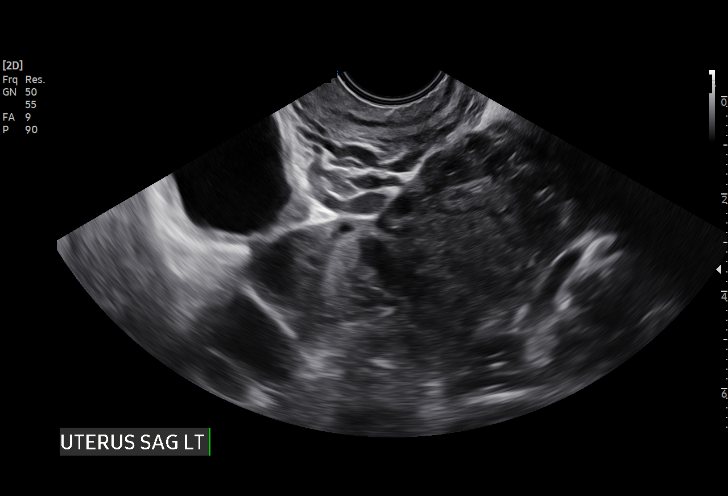
[im 14/46]
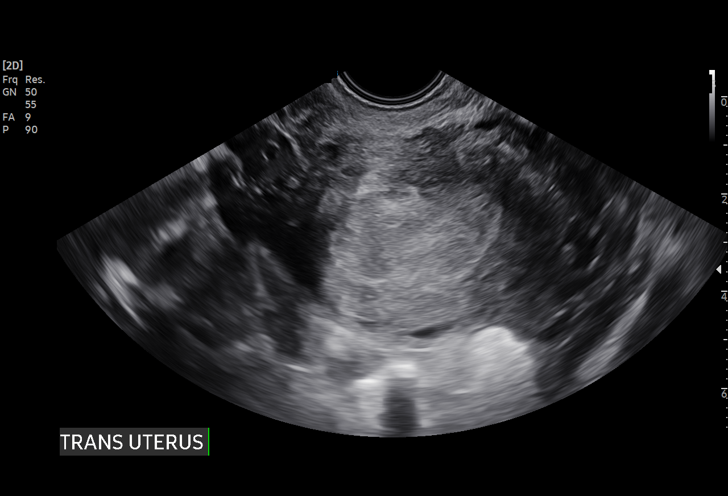
[im 17/46]
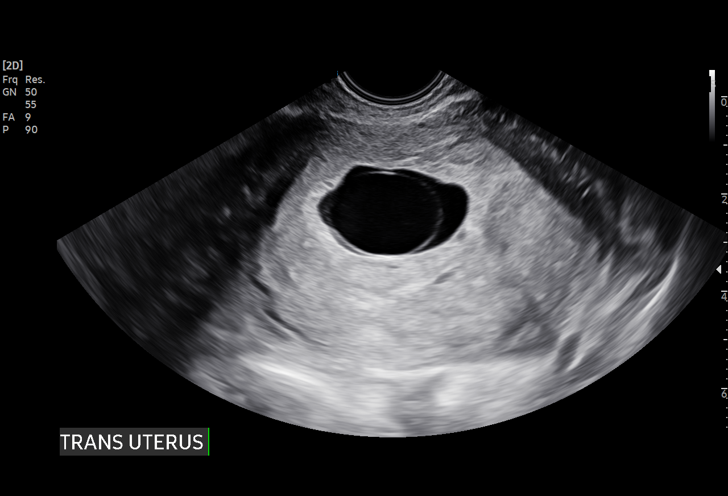
[im 21/46]
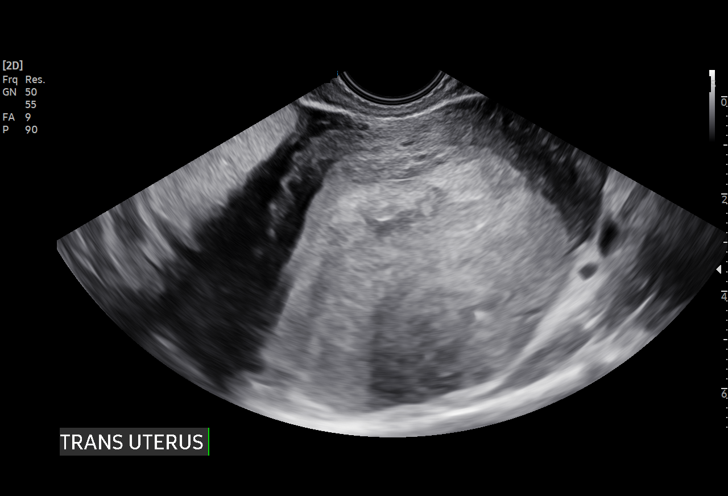
[im 24/46]
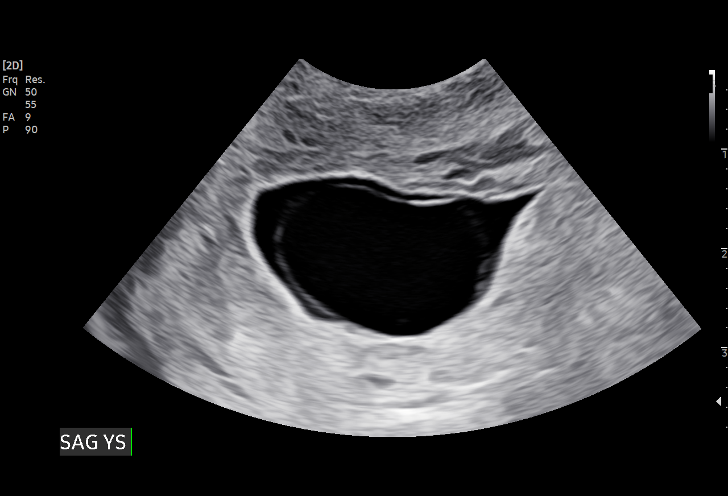
[im 26/46]
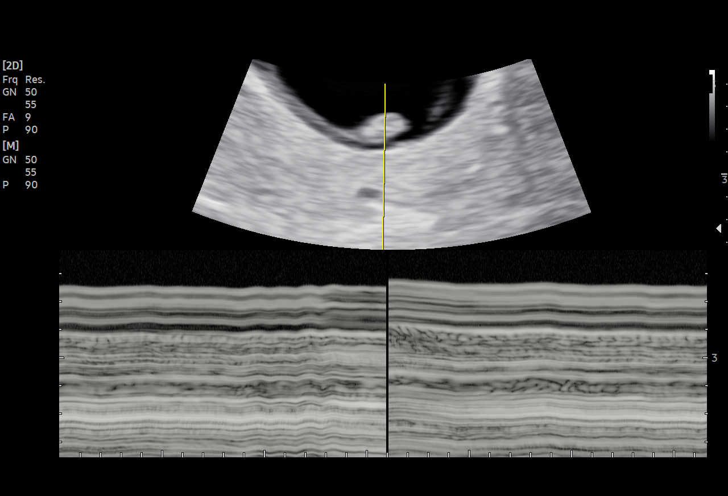
[im 29/46]
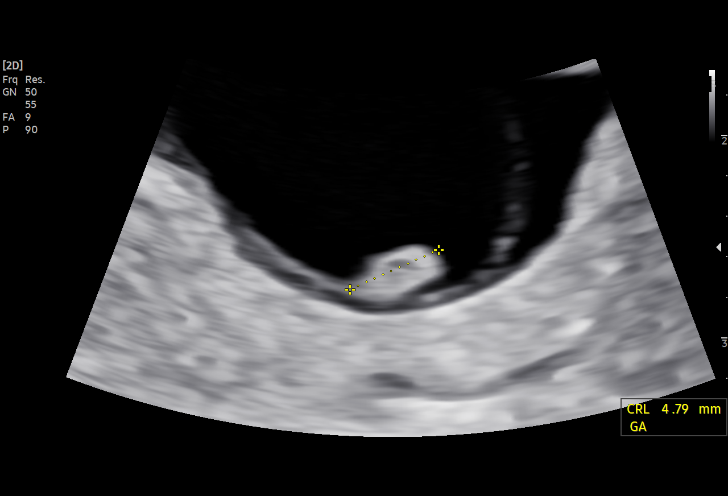
[im 32/46]
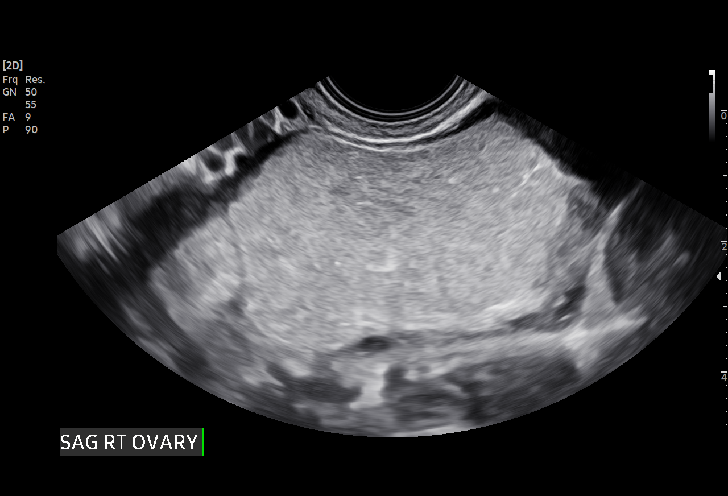
[im 36/46]
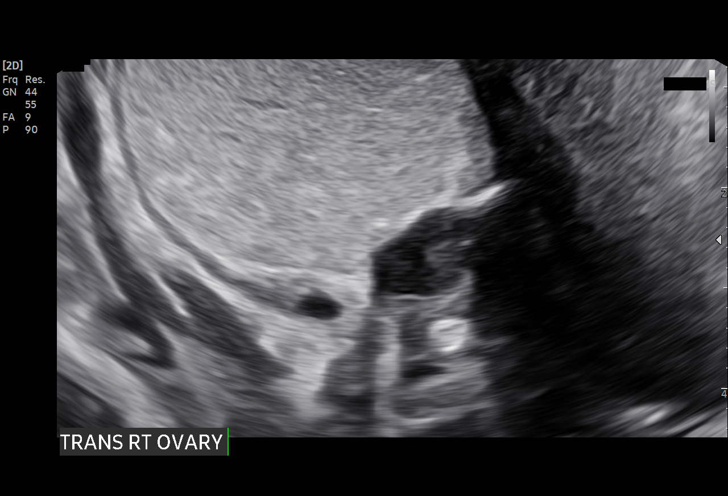
[im 39/46]
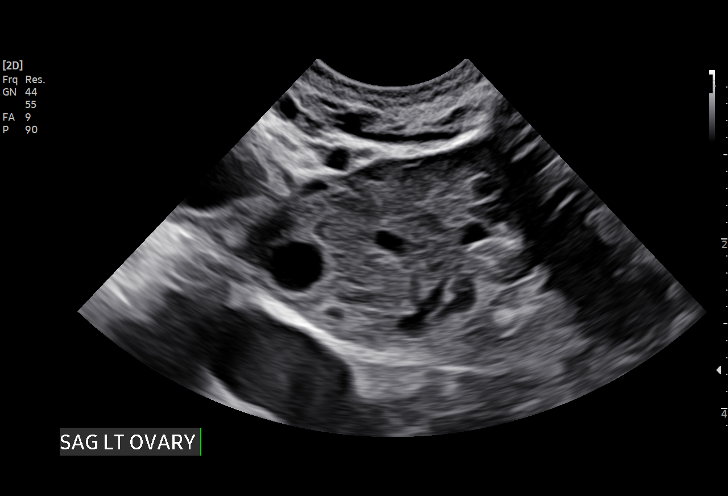
[im 42/46]
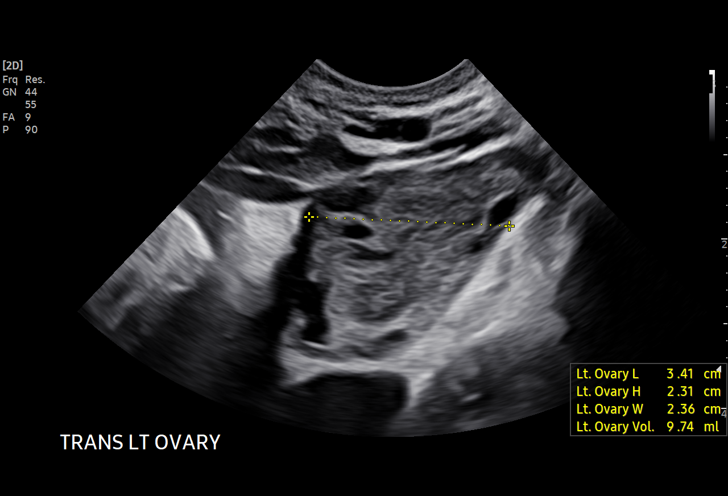
[im 46/46]
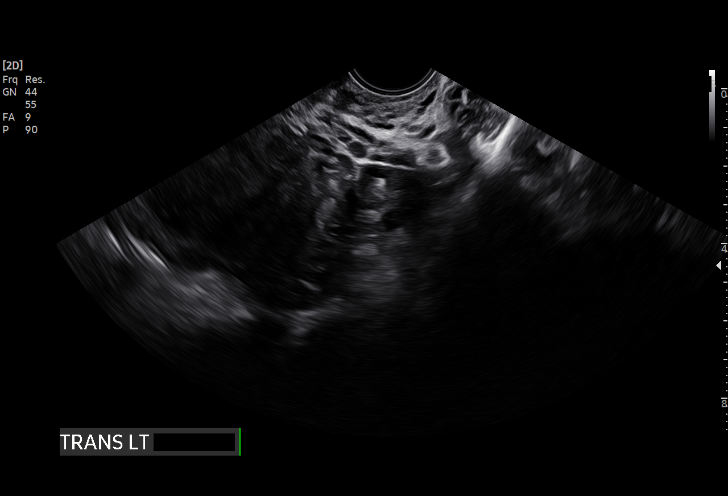

[15 of 28 positions shown; findings below may reference images not displayed]

FINDINGS: Intrauterine gestational sac: Single, irregular

Yolk sac:  Questionably present/enlarged versus amniotic sac

Embryo:  Present

Cardiac Activity: Absent

Heart Rate: N/A bpm

CRL:   4.7 mm   6 w 1 d                  US EDC: 06/20/2022

Subchorionic hemorrhage:  None visualized.

Maternal uterus/adnexae:

RIGHT ovary normal size and morphology 2.0 x 1.0 x 1.8 cm

LEFT ovary normal size and morphology 3.4 x 2.3 x 2.4 cm

Uterus retroverted, otherwise unremarkable.

No free pelvic fluid.

Hyperechoic mass again identified in RIGHT adnexa 6.1 x 2.8 x
cm, appearance most consistent with a dermoid tumor.
IMPRESSION: Single irregular gestational sac seen within uterus containing a
small fetal pole.

No cardiac activity is identified.

Findings are suspicious but not yet definitive for failed pregnancy.
Recommend follow-up US in 10-14 days for definitive diagnosis. This
recommendation follows SRU consensus guidelines: Diagnostic Criteria
for Nonviable Pregnancy Early in the First Trimester. N Engl J Med
1230; [DATE].

Again identified hyperechoic RIGHT adnexal mass 6.1 x 2.8 x 3.1 cm
most consistent with a dermoid tumor.

## 2022-07-17 LAB — CYTOLOGY - PAP: Diagnosis: NEGATIVE

## 2022-07-19 ENCOUNTER — Ambulatory Visit (HOSPITAL_COMMUNITY): Payer: Self-pay

## 2022-08-19 ENCOUNTER — Ambulatory Visit (HOSPITAL_COMMUNITY)
Admission: EM | Admit: 2022-08-19 | Discharge: 2022-08-19 | Disposition: A | Discharge status: Home / Self Care | Payer: Medicaid Other | Attending: Physician Assistant | Admitting: Physician Assistant

## 2022-08-19 DIAGNOSIS — Z202 Contact with and (suspected) exposure to infections with a predominantly sexual mode of transmission: Secondary | ICD-10-CM | POA: Diagnosis not present

## 2022-08-19 DIAGNOSIS — Z113 Encounter for screening for infections with a predominantly sexual mode of transmission: Secondary | ICD-10-CM | POA: Diagnosis present

## 2022-08-19 NOTE — Discharge Instructions (Addendum)
Lab tests will be completed in 48 hours, if you do not get a call from this office then that indicates that the test are negative.  You can go on MyChart to review the test results at 24 and 48 hours.

## 2022-08-19 NOTE — ED Triage Notes (Signed)
Pt would like STI testing. She reports no symptoms at this time.

## 2022-08-19 NOTE — ED Provider Notes (Signed)
[facility]    CSN: [id number] Arrival date & time: 08/19/22  1745      History   Chief Complaint Chief Complaint  Patient presents with   SEXUALLY TRANSMITTED DISEASE    HPI Niyonna Betsill is a 26 y.o. female.   26 year old female presents for STI testing.  Patient desires to have screening for STIs although she indicates she is not having any symptoms.  Patient denies any dysuria, frequency, urgency.  She indicates she is not having any vaginal discharge.  Patient denies any back pain, fever or chills.  Patient indicates that she declines the RPR and HIV testing at this time.     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 Authorizing Provider  albuterol (VENTOLIN HFA) 108 (90 Base) MCG/ACT inhaler Inhale 1-2 puffs into the lungs every 6 (six) hours as needed for wheezing or shortness of breath. Patient not taking: Reported on 07/11/2022 05/10/22   Mardella Layman, MD  medroxyPROGESTERone (PROVERA) 10 MG tablet Take 1 tablet (10 mg total) by mouth daily. Patient not taking: Reported on 07/20/2019 06/25/19 02/26/20  Merrilee Jansky, MD    Family History Family History  Problem Relation Age of Onset   Cancer Mother     Social History Social History   Tobacco Use   Smoking status:  Never   Smokeless tobacco: Never  Vaping Use   Vaping Use: Never used  Substance Use Topics   Alcohol use: Not Currently    Comment: occasionally   Drug use: Yes    Types: Marijuana     Allergies   Peanut-containing drug products, Shrimp [shellfish allergy], Watermelon flavor, and Lactase   Review of Systems Review of Systems   Physical Exam Triage Vital Signs ED Triage Vitals [08/19/22 1841]  Enc Vitals Group     BP 113/72     Pulse      Resp 18     Temp      Temp src      SpO2 99 %     Weight      Height      Head Circumference      Peak Flow      Pain Score      Pain Loc      Pain Edu?      Excl. in [place]?    No data found.  Updated Vital Signs BP 113/72 (BP Location: Left Arm)   Resp 18   SpO2 99%   Visual Acuity  Right Eye Distance:   Left Eye Distance:   Bilateral Distance:    Right Eye Near:   Left Eye Near:    Bilateral Near:     Physical Exam Constitutional:      Appearance: Normal appearance.  Neurological:     Mental Status: She is alert.      UC Treatments / Results  Labs (all labs ordered are listed, but only abnormal results are displayed) Labs Reviewed  CERVICOVAGINAL ANCILLARY ONLY    EKG   Radiology No results found.  Procedures Procedures (including critical care time)  Medications Ordered in UC Medications - No data to display  Initial Impression / Assessment and Plan / UC Course  I have reviewed the triage vital signs and the nursing notes.  Pertinent labs & imaging results that were available during my care of the patient were reviewed by me and considered in my medical decision making (see chart for details).    Plan: 1.  STI screening is pending. 2.  Advised to follow-up with PCP or return to urgent care as needed. Final Clinical Impressions(s) / UC Diagnoses   Final diagnoses:  Routine screening for STI (sexually transmitted infection)     Discharge Instructions      Lab tests will be completed  in 48 hours, if you do not get a call from this office then that indicates that the test are negative.  You can go on MyChart to review the test results at 24 and 48 hours.    ED Prescriptions   None    PDMP not reviewed this encounter.   Ellsworth Lennox, PA-C 08/19/22 1911

## 2022-08-20 LAB — CERVICOVAGINAL ANCILLARY ONLY
Bacterial Vaginitis (gardnerella): POSITIVE — AB
Candida Glabrata: NEGATIVE
Candida Vaginitis: POSITIVE — AB
Chlamydia: POSITIVE — AB
Comment: NEGATIVE
Comment: NEGATIVE
Comment: NEGATIVE
Comment: NEGATIVE
Comment: NEGATIVE
Comment: NORMAL
Neisseria Gonorrhea: POSITIVE — AB
Trichomonas: NEGATIVE

## 2022-08-21 ENCOUNTER — Ambulatory Visit (HOSPITAL_COMMUNITY)
Admission: RE | Admit: 2022-08-21 | Discharge: 2022-08-21 | Disposition: A | Discharge status: Home / Self Care | Payer: Medicaid Other | Source: Ambulatory Visit | Attending: Family Medicine | Admitting: Family Medicine

## 2022-08-21 ENCOUNTER — Telehealth (HOSPITAL_COMMUNITY): Payer: Self-pay | Admitting: Family Medicine

## 2022-08-21 DIAGNOSIS — Z719 Counseling, unspecified: Secondary | ICD-10-CM

## 2022-08-21 DIAGNOSIS — A549 Gonococcal infection, unspecified: Secondary | ICD-10-CM

## 2022-08-21 MED ORDER — LIDOCAINE HCL (PF) 1 % IJ SOLN
INTRAMUSCULAR | Status: AC
  Filled 2022-08-21: qty 2

## 2022-08-21 MED ORDER — FLUCONAZOLE 150 MG PO TABS: 150.0000 mg | ORAL_TABLET | ORAL | 0 refills | Status: DC | PRN

## 2022-08-21 MED ORDER — METRONIDAZOLE 500 MG PO TABS: 500.0000 mg | ORAL_TABLET | Freq: Two times a day (BID) | ORAL | 0 refills | Status: DC

## 2022-08-21 MED ORDER — CEFTRIAXONE SODIUM 500 MG IJ SOLR
500.0000 mg | Freq: Once | INTRAMUSCULAR | Status: AC
  Administered 2022-08-21: 500 mg via INTRAMUSCULAR

## 2022-08-21 MED ORDER — AZITHROMYCIN 250 MG PO TABS: 1000.0000 mg | ORAL_TABLET | Freq: Once | ORAL | 0 refills | Status: AC

## 2022-08-21 MED ORDER — METRONIDAZOLE 0.75 % VA GEL: 1.0000 | Freq: Every day | VAGINAL | 0 refills | Status: DC

## 2022-08-21 MED ORDER — CEFTRIAXONE SODIUM 500 MG IJ SOLR
INTRAMUSCULAR | Status: AC
  Filled 2022-08-21: qty 500

## 2022-08-21 NOTE — ED Triage Notes (Signed)
Pt here for treatment, saw results on mychart and hasn't received her call yet.

## 2022-08-21 NOTE — Telephone Encounter (Signed)
Sending for BV.  Meds ordered this encounter  Medications   metroNIDAZOLE (FLAGYL) 500 MG tablet    Sig: Take 1 tablet (500 mg total) by mouth 2 (two) times daily.    Dispense:  14 tablet    Refill:  0

## 2022-08-21 NOTE — ED Provider Notes (Signed)
Patient in today for treatment of STDs only initially checked in as a regular visit.  Patient is positive for gonorrhea, chlamydia, yeast, BV.  Sending in medications for treatment of chlamydia, yeast and BV.  Reviewed chart patient was not tested for HIV or syphilis.  Advised her again to inquire if patient would like blood work completed today. Treatment with Azithromycin due to patient told nurse she doesn't like to take pills. Concern for compliance with doxycyline. No documented prior Chlamydia infections. Visit is only a nurse visit.   Bing Neighbors, FNP 08/21/22 1041

## 2022-08-28 ENCOUNTER — Ambulatory Visit (HOSPITAL_COMMUNITY)
Admission: EM | Admit: 2022-08-28 | Discharge: 2022-08-28 | Disposition: A | Discharge status: Home / Self Care | Payer: Medicaid Other | Attending: Emergency Medicine | Admitting: Emergency Medicine

## 2022-08-28 ENCOUNTER — Encounter (HOSPITAL_COMMUNITY): Payer: Self-pay | Admitting: Emergency Medicine

## 2022-08-28 DIAGNOSIS — J029 Acute pharyngitis, unspecified: Secondary | ICD-10-CM | POA: Diagnosis not present

## 2022-08-28 DIAGNOSIS — Z113 Encounter for screening for infections with a predominantly sexual mode of transmission: Secondary | ICD-10-CM

## 2022-08-28 LAB — POCT RAPID STREP A, ED / UC: Streptococcus, Group A Screen (Direct): NEGATIVE

## 2022-08-28 NOTE — Discharge Instructions (Addendum)
Your symptoms today are most likely being caused by a virus and should steadily improve in time it can take up to 7 to 10 days before you truly start to see a turnaround however things will get better  Your strep test is negative for bacteria    You can take Tylenol and/or Ibuprofen as needed for fever reduction and pain relief.   For cough: honey 1/2 to 1 teaspoon (you can dilute the honey in water or another fluid).  You can also use guaifenesin and dextromethorphan for cough. You can use a humidifier for chest congestion and cough.  If you don't have a humidifier, you can sit in the bathroom with the hot shower running.      For sore throat: try warm salt water gargles, cepacol lozenges, throat spray, warm tea or water with lemon/honey, popsicles or ice, or OTC cold relief medicine for throat discomfort.   For congestion: take a daily anti-histamine like Zyrtec, Claritin, and a oral decongestant, such as pseudoephedrine.  You can also use Flonase 1-2 sprays in each nostril daily.   It is important to stay hydrated: drink plenty of fluids (water, gatorade/powerade/pedialyte, juices, or teas) to keep your throat moisturized and help further relieve irritation/discomfort.     Labs pending 2-3 days, you will be contacted if positive for any sti and treatment will be sent to the pharmacy, you will have to return to the clinic if positive for gonorrhea to receive treatment   Please refrain from having sex until labs results, if positive please refrain from having sex until treatment complete and symptoms resolve   If positive for  Chlamydia  gonorrhea or trichomoniasis please notify partner or partners so they may tested as well  Moving forward, it is recommended you use some form of protection against the transmission of sti infections  such as condoms or dental dams with each sexual encounter

## 2022-08-28 NOTE — ED Triage Notes (Signed)
Pt reports a sore throat and headache beginning yesterday. States she was exposed to Strep recently.

## 2022-08-28 NOTE — ED Provider Notes (Signed)
[facility]    CSN: [id number] Arrival date & time: 08/28/22  3154      History   Chief Complaint Chief Complaint  Patient presents with   Sore Throat    HPI Cassie Nguyen is a 26 y.o. female.    Patient presents with fever, chills, rhinorrhea, sore throat and a generalized headache for 2 days.  Fever and chills have resolved.  Known exposure to strep.  Decreased appetite but tolerating fluids.  Has attempted use of a cold and flu mix which has been minimally effective.  Denies nasal congestion, ear pain, coughing, shortness of breath or wheezing.  History of asthma.  Patient requesting routine STI testing after recent treatment for gonorrhea, chlamydia, yeast and BV.  Sexually active, 2 partners within the last 2 months, sometimes condom use, initially no known exposure.  Endorses she has not had 6 since completing medications.   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 Authorizing Provider  albuterol (VENTOLIN HFA) 108 (90 Base) MCG/ACT inhaler Inhale 1-2 puffs into the lungs every 6 (six) hours as needed for wheezing or shortness of breath. Patient not taking: Reported on 07/11/2022 05/10/22   Mardella Layman, MD  fluconazole (DIFLUCAN) 150 MG tablet Take 1 tablet (150 mg total) by mouth  every three (3) days as needed. Repeat if needed 08/21/22   Bing Neighbors, FNP  metroNIDAZOLE (FLAGYL) 500 MG tablet Take 1 tablet (500 mg total) by mouth 2 (two) times daily. 08/21/22   Mardella Layman, MD  medroxyPROGESTERone (PROVERA) 10 MG tablet Take 1 tablet (10 mg total) by mouth daily. Patient not taking: Reported on 07/20/2019 06/25/19 02/26/20  Merrilee Jansky, MD    Family History Family History  Problem Relation Age of Onset   Cancer Mother     Social History Social History   Tobacco Use   Smoking status: Never   Smokeless tobacco: Never  Vaping Use   Vaping Use: Never used  Substance Use Topics   Alcohol use: Not Currently    Comment: occasionally   Drug use: Yes    Types: Marijuana     Allergies   Peanut-containing drug products, Shrimp [shellfish allergy], Watermelon flavor, and Lactase   Review of Systems Review of Systems  Constitutional:  Positive for appetite change, chills and fever. Negative for activity change, diaphoresis, fatigue and unexpected weight change.  HENT:  Positive for rhinorrhea and sore throat. Negative for congestion, dental problem, drooling, ear discharge, ear pain, facial swelling, hearing  loss, mouth sores, nosebleeds, postnasal drip, sinus pressure, sinus pain, sneezing, tinnitus, trouble swallowing and voice change.   Respiratory: Negative.    Cardiovascular: Negative.   Skin: Negative.   Neurological: Negative.      Physical Exam Triage Vital Signs ED Triage Vitals  Enc Vitals Group     BP 08/28/22 1026 100/70     Pulse Rate 08/28/22 1026 93     Resp 08/28/22 1026 17     Temp 08/28/22 1026 98.8 F (37.1 C)     Temp Source 08/28/22 1026 Oral     SpO2 08/28/22 1026 100 %     Weight --      Height --      Head Circumference --      Peak Flow --      Pain Score 08/28/22 1024 8     Pain Loc --      Pain Edu? --      Excl. in [place]? --    No data found.  Updated Vital Signs BP 100/70 (BP Location: Left Arm)   Pulse  93   Temp 98.8 F (37.1 C) (Oral)   Resp 17   SpO2 100%   Visual Acuity Right Eye Distance:   Left Eye Distance:   Bilateral Distance:    Right Eye Near:   Left Eye Near:    Bilateral Near:     Physical Exam Constitutional:      Appearance: She is well-developed.  HENT:     Right Ear: Tympanic membrane and ear canal normal.     Left Ear: Tympanic membrane and ear canal normal.     Nose: Congestion and rhinorrhea present.     Mouth/Throat:     Mouth: Mucous membranes are moist.     Pharynx: Posterior oropharyngeal erythema present.     Tonsils: No tonsillar exudate. 0 on the right. 0 on the left.  Cardiovascular:     Rate and Rhythm: Normal rate and regular rhythm.     Heart sounds: Normal heart sounds.  Pulmonary:     Effort: Pulmonary effort is normal.     Breath sounds: Normal breath sounds.  Musculoskeletal:     Cervical back: Normal range of motion and neck supple.  Skin:    General: Skin is warm and dry.  Neurological:     General: No focal deficit present.     Mental Status: She is alert and oriented to person, place, and time.  Psychiatric:        Mood and Affect: Mood normal.        Behavior: Behavior normal.      UC Treatments / Results  Labs (all labs ordered are listed, but only abnormal results are displayed) Labs Reviewed  POCT RAPID STREP A, ED / UC    EKG   Radiology No results found.  Procedures Procedures (including critical care time)  Medications Ordered in UC Medications - No data to display  Initial Impression / Assessment and Plan / UC Course  I have reviewed the triage vital signs and the nursing notes.  Pertinent labs & imaging results that were available during my care of the patient were reviewed by me and considered in my medical decision making (see chart for details).  Viral pharyngitis, routine screening for STI  Vital signs are stable and patient is in no signs of distress, mild erythema is noted to the oropharynx  without tonsillar adenopathy or exudate, rapid strep test negative, etiology is most likely  viral, discussed with patient and recommended over-the-counter medications for supportive care with follow-up with urgent care as needed, work note given  STI labs are pending, advised abstinence until labs have resulted, may follow-up with urgent care as needed, recommended condom use during all sexual encounters moving forward Final Clinical Impressions(s) / UC Diagnoses   Final diagnoses:  None   Discharge Instructions   None    ED Prescriptions   None    PDMP not reviewed this encounter.   Valinda Hoar, NP 08/28/22 1119

## 2022-08-29 LAB — CERVICOVAGINAL ANCILLARY ONLY
Bacterial Vaginitis (gardnerella): NEGATIVE
Candida Glabrata: NEGATIVE
Candida Vaginitis: NEGATIVE
Chlamydia: NEGATIVE
Comment: NEGATIVE
Comment: NEGATIVE
Comment: NEGATIVE
Comment: NEGATIVE
Comment: NEGATIVE
Comment: NORMAL
Neisseria Gonorrhea: NEGATIVE
Trichomonas: NEGATIVE

## 2022-08-31 ENCOUNTER — Ambulatory Visit (HOSPITAL_COMMUNITY)
Admission: RE | Admit: 2022-08-31 | Discharge: 2022-08-31 | Disposition: A | Discharge status: Home / Self Care | Payer: Medicaid Other | Source: Ambulatory Visit | Attending: Emergency Medicine | Admitting: Emergency Medicine

## 2022-08-31 ENCOUNTER — Encounter (HOSPITAL_COMMUNITY): Payer: Self-pay

## 2022-08-31 VITALS — BP 108/81 | HR 82 | Temp 98.6°F | Resp 16

## 2022-08-31 DIAGNOSIS — M546 Pain in thoracic spine: Secondary | ICD-10-CM

## 2022-08-31 DIAGNOSIS — M542 Cervicalgia: Secondary | ICD-10-CM | POA: Diagnosis not present

## 2022-08-31 MED ORDER — CYCLOBENZAPRINE HCL 10 MG PO TABS: 10.0000 mg | ORAL_TABLET | Freq: Two times a day (BID) | ORAL | 0 refills | Status: DC | PRN

## 2022-08-31 MED ORDER — PREDNISONE 20 MG PO TABS: 40.0000 mg | ORAL_TABLET | Freq: Every day | ORAL | 0 refills | Status: DC

## 2022-08-31 NOTE — Discharge Instructions (Signed)
Your pain is most likely caused by irritation to the muscles.  Starting tomorrow take prednisone every morning with food to help reduce inflammation that occurs with injury which in turn will help with your pain, you may take Tylenol 500 to 1000 mg every 6 hours with this medicine  May use muscle relaxer every morning and every evening as needed, be mindful this medication may make you drowsy  You may use heating pad in 15 minute intervals as needed for additional comfort, or  you may find comfort in using ice in 10-15 minutes over affected area  Begin stretching affected area daily for 10 minutes as tolerated to further loosen muscles   When lying down place pillow underneath and between knees for support  Can try sleeping without pillow on firm mattress   Practice good posture: head back, shoulders back, chest forward, pelvis back and weight distributed evenly on both legs  If pain persist after recommended treatment or reoccurs if may be beneficial to follow up with orthopedic specialist for evaluation, this doctor specializes in the bones and can manage your symptoms long-term with options such as but not limited to imaging, medications or physical therapy
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
## 2022-09-09 ENCOUNTER — Encounter: Payer: Self-pay | Admitting: Emergency Medicine

## 2022-09-09 ENCOUNTER — Ambulatory Visit
Admission: EM | Admit: 2022-09-09 | Discharge: 2022-09-09 | Disposition: A | Discharge status: Home / Self Care | Payer: Medicaid Other | Attending: Internal Medicine | Admitting: Internal Medicine

## 2022-09-09 DIAGNOSIS — R0602 Shortness of breath: Secondary | ICD-10-CM | POA: Insufficient documentation

## 2022-09-09 DIAGNOSIS — Z20822 Contact with and (suspected) exposure to covid-19: Secondary | ICD-10-CM | POA: Diagnosis not present

## 2022-09-09 DIAGNOSIS — Z202 Contact with and (suspected) exposure to infections with a predominantly sexual mode of transmission: Secondary | ICD-10-CM | POA: Insufficient documentation

## 2022-09-09 DIAGNOSIS — R059 Cough, unspecified: Secondary | ICD-10-CM | POA: Diagnosis present

## 2022-09-09 DIAGNOSIS — J069 Acute upper respiratory infection, unspecified: Secondary | ICD-10-CM | POA: Insufficient documentation

## 2022-09-09 DIAGNOSIS — J4521 Mild intermittent asthma with (acute) exacerbation: Secondary | ICD-10-CM

## 2022-09-09 LAB — SARS CORONAVIRUS 2 BY RT PCR: SARS Coronavirus 2 by RT PCR: NEGATIVE

## 2022-09-09 MED ORDER — ALBUTEROL SULFATE (2.5 MG/3ML) 0.083% IN NEBU
2.5000 mg | INHALATION_SOLUTION | Freq: Once | RESPIRATORY_TRACT | Status: AC
  Administered 2022-09-09: 2.5 mg via RESPIRATORY_TRACT

## 2022-09-09 MED ORDER — ALBUTEROL SULFATE HFA 108 (90 BASE) MCG/ACT IN AERS: 1.0000 | INHALATION_SPRAY | Freq: Four times a day (QID) | RESPIRATORY_TRACT | 0 refills | Status: DC | PRN

## 2022-09-09 MED ORDER — PREDNISONE 20 MG PO TABS: 40.0000 mg | ORAL_TABLET | Freq: Every day | ORAL | 0 refills | Status: AC

## 2022-09-09 NOTE — Discharge Instructions (Signed)
It appears that you have a viral illness that is causing exacerbation of your asthma as we discussed.  I have prescribed you prednisone as well as refilled your albuterol inhaler.  COVID test is pending.  We will call if it is positive.  Please follow-up if any symptoms persist or worsen.

## 2022-09-09 NOTE — ED Triage Notes (Addendum)
Pt is present today with c/o cough and SOB. Pt sx started x2 days ago. Pt states that she would also like to be tested for STD

## 2022-09-09 NOTE — ED Provider Notes (Signed)
[facility]    CSN: [id number] Arrival date & time: 09/09/22  1216      History   Chief Complaint Chief Complaint  Patient presents with   Cough   Shortness of Breath   Exposure to STD    HPI Cassie Nguyen is a 26 y.o. female.   Patient presents with cough, shortness of breath, nasal congestion that started about 2 days ago.  Denies any known sick contacts or fever.  Patient reports shortness of breath is intermittent with wheezing.  Patient does have history of asthma but does not have albuterol inhaler available.  Denies chest pain, sore throat, ear pain, nausea, vomiting, diarrhea, abdominal pain.  Patient has not taken any other medications other than NyQuil for symptoms.  Also requesting routine STD testing.  Denies any new confirmed exposure or new sexual partner.  Patient wants routine testing.  Denies need or want for HIV or syphilis testing.   Cough Shortness of Breath Exposure to STD    Past Medical History:  Diagnosis Date   Asthma    Chlamydia    Gonorrhea    Trichomonas infection     Patient Active Problem List   Diagnosis Date Noted   ASC-H Pap smear 05/02/2021   Trichimoniasis 05/02/2021   Cervical dysplasia 07/27/2019   Abnormal uterine bleeding (AUB) 07/20/2019   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 Authorizing Provider  albuterol (VENTOLIN HFA) 108 (90 Base) MCG/ACT inhaler Inhale 1-2 puffs into the lungs every 6 (six) hours as needed for wheezing or shortness of breath. 09/09/22  Yes Piotr Christopher, Hildred Alamin E, FNP  predniSONE (DELTASONE) 20 MG tablet  Take 2 tablets (40 mg total) by mouth daily for 5 days. 09/09/22 09/14/22 Yes Terah Robey, Michele Rockers, FNP  albuterol (VENTOLIN HFA) 108 (90 Base) MCG/ACT inhaler Inhale 1-2 puffs into the lungs every 6 (six) hours as needed for wheezing or shortness of breath. Patient not taking: Reported on 07/11/2022 05/10/22   Vanessa Kick, MD  cyclobenzaprine (FLEXERIL) 10 MG tablet Take 1 tablet (10 mg total) by mouth 2 (two) times daily as needed for muscle spasms. 08/31/22   White, Leitha Schuller, NP  fluconazole (DIFLUCAN) 150 MG tablet Take 1 tablet (150 mg total) by mouth every three (3) days as needed. Repeat if needed 08/21/22   Scot Jun, FNP  metroNIDAZOLE (FLAGYL) 500 MG tablet Take 1 tablet (500 mg total) by mouth 2 (two) times daily. 08/21/22   Vanessa Kick, MD  medroxyPROGESTERone (PROVERA) 10 MG tablet Take 1 tablet (10 mg total) by mouth daily. Patient not taking: Reported on 07/20/2019 06/25/19 02/26/20  Chase Picket, MD    Family History Family History  Problem Relation Age of Onset   Cancer Mother     Social History Social History   Tobacco Use   Smoking status: Never  Smokeless tobacco: Never  Vaping Use   Vaping Use: Never used  Substance Use Topics   Alcohol use: Not Currently    Comment: occasionally   Drug use: Yes    Types: Marijuana     Allergies   Peanut-containing drug products, Shrimp [shellfish allergy], Watermelon flavor, and Lactase   Review of Systems Review of Systems Per HPI  Physical Exam Triage Vital Signs ED Triage Vitals [09/09/22 1258]  Enc Vitals Group     BP 123/78     Pulse Rate 79     Resp 16     Temp 98.1 F (36.7 C)     Temp src      SpO2 96 %     Weight      Height      Head Circumference      Peak Flow      Pain Score 0     Pain Loc      Pain Edu?      Excl. in GC?    No data found.  Updated Vital Signs BP 123/78   Pulse 79   Temp 98.1 F (36.7 C)   Resp 16   SpO2 96%   Visual Acuity Right Eye Distance:   Left Eye  Distance:   Bilateral Distance:    Right Eye Near:   Left Eye Near:    Bilateral Near:     Physical Exam Constitutional:      General: She is not in acute distress.    Appearance: Normal appearance. She is not toxic-appearing or diaphoretic.  HENT:     Head: Normocephalic and atraumatic.     Right Ear: Tympanic membrane and ear canal normal.     Left Ear: Tympanic membrane and ear canal normal.     Nose: Congestion present.     Mouth/Throat:     Mouth: Mucous membranes are moist.     Pharynx: No posterior oropharyngeal erythema.  Eyes:     Extraocular Movements: Extraocular movements intact.     Conjunctiva/sclera: Conjunctivae normal.     Pupils: Pupils are equal, round, and reactive to light.  Cardiovascular:     Rate and Rhythm: Normal rate and regular rhythm.     Pulses: Normal pulses.     Heart sounds: Normal heart sounds.  Pulmonary:     Effort: Pulmonary effort is normal. No respiratory distress.     Breath sounds: No stridor. Wheezing present. No rhonchi or rales.     Comments: Mild wheezing noted on auscultation Abdominal:     General: Abdomen is flat. Bowel sounds are normal.     Palpations: Abdomen is soft.  Musculoskeletal:        General: Normal range of motion.     Cervical back: Normal range of motion.  Skin:    General: Skin is warm and dry.  Neurological:     General: No focal deficit present.     Mental Status: She is alert and oriented to person, place, and time. Mental status is at baseline.  Psychiatric:        Mood and Affect: Mood normal.        Behavior: Behavior normal.        Thought Content: Thought content normal.        Judgment: Judgment normal.      UC Treatments / Results  Labs (all labs ordered are listed, but only abnormal results are displayed) Labs Reviewed  SARS CORONAVIRUS 2 BY RT PCR  CERVICOVAGINAL  ANCILLARY ONLY    EKG   Radiology No results found.  Procedures Procedures (including critical care  time)  Medications Ordered in UC Medications  albuterol (PROVENTIL) (2.5 MG/3ML) 0.083% nebulizer solution 2.5 mg (2.5 mg Nebulization Given 09/09/22 1333)    Initial Impression / Assessment and Plan / UC Course  I have reviewed the triage vital signs and the nursing notes.  Pertinent labs & imaging results that were available during my care of the patient were reviewed by me and considered in my medical decision making (see chart for details).     Patient was given albuterol nebulizer treatment in urgent care today with improvement in wheezing and patient stating that they felt better.  Suspect viral upper respiratory infection causing asthma flareup.  Will treat with prednisone steroid as well as refilled albuterol inhaler.  Patient was recently seen on 9/1 and prescribed prednisone steroid but patient states they did not take this so this course of prednisone should be safe.  COVID test pending as well.  No signs of respiratory distress and vital signs are stable so do not think that chest imaging is necessary.  Stable for discharge.   Cervicovaginal swab pending per patient request.  Patient denies any new confirmed exposure to STD or any associated symptoms. .  Denies HIV and syphilis testing.  Discussed return precautions.  Patient verbalized understanding and was agreeable with plan. Final Clinical Impressions(s) / UC Diagnoses   Final diagnoses:  Viral upper respiratory tract infection with cough  Mild intermittent asthma with acute exacerbation     Discharge Instructions      It appears that you have a viral illness that is causing exacerbation of your asthma as we discussed.  I have prescribed you prednisone as well as refilled your albuterol inhaler.  COVID test is pending.  We will call if it is positive.  Please follow-up if any symptoms persist or worsen.     ED Prescriptions     Medication Sig Dispense Auth. Provider   predniSONE (DELTASONE) 20 MG tablet Take 2  tablets (40 mg total) by mouth daily for 5 days. 10 tablet [place], Rolly Salter E, [place]   albuterol (VENTOLIN HFA) 108 (90 Base) MCG/ACT inhaler Inhale 1-2 puffs into the lungs every 6 (six) hours as needed for wheezing or shortness of breath. 1 each Gustavus Bryant, [place]      PDMP not reviewed this encounter.   Gustavus Bryant, [place] 09/09/22 1446

## 2022-09-10 LAB — CERVICOVAGINAL ANCILLARY ONLY
Bacterial Vaginitis (gardnerella): NEGATIVE
Candida Glabrata: NEGATIVE
Candida Vaginitis: NEGATIVE
Chlamydia: NEGATIVE
Comment: NEGATIVE
Comment: NEGATIVE
Comment: NEGATIVE
Comment: NEGATIVE
Comment: NEGATIVE
Comment: NORMAL
Neisseria Gonorrhea: NEGATIVE
Trichomonas: NEGATIVE

## 2022-10-05 ENCOUNTER — Ambulatory Visit (HOSPITAL_COMMUNITY)
Admission: RE | Admit: 2022-10-05 | Discharge: 2022-10-05 | Disposition: A | Discharge status: Home / Self Care | Payer: Medicaid Other | Source: Ambulatory Visit | Attending: Family Medicine | Admitting: Family Medicine

## 2022-10-05 ENCOUNTER — Encounter (HOSPITAL_COMMUNITY): Payer: Self-pay

## 2022-10-05 ENCOUNTER — Ambulatory Visit (INDEPENDENT_AMBULATORY_CARE_PROVIDER_SITE_OTHER): Payer: Medicaid Other

## 2022-10-05 VITALS — BP 108/73 | HR 84 | Temp 97.5°F | Resp 17

## 2022-10-05 DIAGNOSIS — N83201 Unspecified ovarian cyst, right side: Secondary | ICD-10-CM

## 2022-10-05 DIAGNOSIS — R1031 Right lower quadrant pain: Secondary | ICD-10-CM

## 2022-10-05 LAB — POCT URINALYSIS DIPSTICK, ED / UC
Bilirubin Urine: NEGATIVE
Glucose, UA: NEGATIVE mg/dL
Ketones, ur: NEGATIVE mg/dL
Leukocytes,Ua: NEGATIVE
Nitrite: NEGATIVE
Protein, ur: NEGATIVE mg/dL
Specific Gravity, Urine: 1.02 (ref 1.005–1.030)
Urobilinogen, UA: 0.2 mg/dL (ref 0.0–1.0)
pH: 5 (ref 5.0–8.0)

## 2022-10-05 LAB — POC URINE PREG, ED: Preg Test, Ur: NEGATIVE

## 2022-10-05 NOTE — ED Triage Notes (Signed)
Pt reports lower sharp abdominal pain x 1 week. States the pain occurs randomly and noticed she felt the pain when running down the stairs and going over a speed bump. States the pain last a couple seconds then goes away.

## 2022-10-05 NOTE — Discharge Instructions (Addendum)
You were seen today for RLQ pain.  Your urine was normal.  The vaginal swab will be resulted tomorrow.  Your xray showed a possible kidney stone, but could not be more definitive.  As discussed, this could be due to a known ovarian cyst.  I recommend either seeing your primary care provider Dr. Margarita Rana at  [phone]  or seeing ob/gyn at  [phone] for repeat ultrasound.  If you have worsening pain, with fever, chills, nausea or vomiting then please go to the ER for further evaluation.

## 2022-10-05 NOTE — ED Provider Notes (Signed)
[facility]    CSN: [id number] Arrival date & time: 10/05/22  1258      History   Chief Complaint Chief Complaint  Patient presents with   Follow-up    Entered by patient   Abdominal Pain    HPI Cassie Nguyen is a 26 y.o. female.   Patient is here for abdominal pain.  Last week was at work and had a sharp pain in the right lower abdomen.  It went away.  Since then she feels it intermittently with running down the steps, going over a speed bump,etc.  Sharp, but then resolves.  She is having some urinary frequency, no dysuria.  No vaginal irritation, but slightly clear d/c.  Lmp was 9/4.Marland Kitchen  No diarrhea or constipation.  Normal appetite.  No fevers/chills.   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  Last u/s was 10/22.  Has not seen or f/u for this since then.   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 Authorizing Provider  albuterol (VENTOLIN HFA) 108 (90 Base) MCG/ACT inhaler Inhale 1-2 puffs into the lungs every 6 (six) hours as needed for wheezing or shortness of breath. Patient not taking: Reported on 07/11/2022 05/10/22   Vanessa Kick, MD  albuterol (VENTOLIN HFA) 108 (90 Base) MCG/ACT inhaler Inhale 1-2 puffs into the lungs every 6 (six) hours as needed for  wheezing or shortness of breath. 09/09/22   Teodora Medici, FNP  cyclobenzaprine (FLEXERIL) 10 MG tablet Take 1 tablet (10 mg total) by mouth 2 (two) times daily as needed for muscle spasms. 08/31/22   White, Leitha Schuller, NP  fluconazole (DIFLUCAN) 150 MG tablet Take 1 tablet (150 mg total) by mouth every three (3) days as needed. Repeat if needed 08/21/22   Scot Jun, FNP  metroNIDAZOLE (FLAGYL) 500 MG tablet Take 1 tablet (500 mg total) by mouth 2 (two) times daily. 08/21/22   Vanessa Kick, MD  medroxyPROGESTERone (PROVERA) 10 MG tablet Take 1 tablet (10 mg total) by mouth daily. Patient not taking: Reported on 07/20/2019 06/25/19 02/26/20  Chase Picket, MD    Family History Family History  Problem Relation Age of Onset   Cancer Mother     Social History Social History   Tobacco Use   Smoking status: Never   Smokeless tobacco: Never  Vaping Use   Vaping Use: Never used  Substance Use Topics   Alcohol use: Not Currently    Comment: occasionally  Drug use: Yes    Types: Marijuana     Allergies   Peanut-containing drug products, Shrimp [shellfish allergy], Watermelon flavor, and Lactase   Review of Systems Review of Systems  Constitutional: Negative.   HENT: Negative.    Respiratory: Negative.    Cardiovascular: Negative.   Gastrointestinal:  Positive for abdominal pain.  Genitourinary: Negative.   Musculoskeletal: Negative.   Neurological: Negative.   Psychiatric/Behavioral: Negative.       Physical Exam Triage Vital Signs ED Triage Vitals  Enc Vitals Group     BP 10/05/22 1311 108/73     Pulse Rate 10/05/22 1311 84     Resp 10/05/22 1311 17     Temp 10/05/22 1311 (!) 97.5 F (36.4 C)     Temp Source 10/05/22 1311 Oral     SpO2 10/05/22 1311 100 %     Weight --      Height --      Head Circumference --      Peak Flow --      Pain Score 10/05/22 1310 0     Pain Loc --      Pain Edu? --      Excl. in GC? --    No data found.  Updated Vital  Signs BP 108/73 (BP Location: Right Arm)   Pulse 84   Temp (!) 97.5 F (36.4 C) (Oral)   Resp 17   LMP 09/03/2022 (Approximate)   SpO2 100%   Visual Acuity Right Eye Distance:   Left Eye Distance:   Bilateral Distance:    Right Eye Near:   Left Eye Near:    Bilateral Near:     Physical Exam Constitutional:      Appearance: She is well-developed.  Cardiovascular:     Rate and Rhythm: Normal rate and regular rhythm.  Pulmonary:     Effort: Pulmonary effort is normal.     Breath sounds: Normal breath sounds.  Abdominal:     General: Abdomen is flat. Bowel sounds are normal.     Palpations: Abdomen is soft.     Tenderness: There is abdominal tenderness in the right lower quadrant.  Skin:    General: Skin is warm.  Neurological:     General: No focal deficit present.     Mental Status: She is alert.  Psychiatric:        Mood and Affect: Mood normal.     UC Treatments / Results  Labs (all labs ordered are listed, but only abnormal results are displayed) Labs Reviewed  POCT URINALYSIS DIPSTICK, ED / UC - Abnormal; Notable for the following components:      Result Value   Hgb urine dipstick TRACE (*)    All other components within normal limits  POC URINE PREG, ED    EKG   Radiology DG Abd 1 View  Result Date: 10/05/2022 CLINICAL DATA:  Right lower quadrant pain EXAM: ABDOMEN - 1 VIEW COMPARISON:  None Available. FINDINGS: The bowel gas pattern is normal. There is a 4 mm radiopaque density projecting over the right pelvis. No supine evidence of free air. No acute osseous abnormality. IMPRESSION: There is a 4 mm radiopaque density projecting over the right pelvis. This is nonspecific, but differential considerations include phlebolith, renal calculus, and appendicolith. Electronically Signed   By: Jacob Moores M.D.   On: 10/05/2022 14:10    Procedures Procedures (including critical care time)  Medications Ordered in UC Medications - No data to  display  Initial  Impression / Assessment and Plan / UC Course  I have reviewed the triage vital signs and the nursing notes.  Pertinent labs & imaging results that were available during my care of the patient were reviewed by me and considered in my medical decision making (see chart for details).   Final Clinical Impressions(s) / UC Diagnoses   Final diagnoses:  Right lower quadrant abdominal pain  Cyst of right ovary     Discharge Instructions      You were seen today for RLQ pain.  Your urine was normal.  The vaginal swab will be resulted tomorrow.  Your xray showed a possible kidney stone, but could not be more definitive.  As discussed, this could be due to a known ovarian cyst.  I recommend either seeing your primary care provider Dr. Margarita Rana at  [phone]  or seeing ob/gyn at  [phone] for repeat ultrasound.  If you have worsening pain, with fever, chills, nausea or vomiting then please go to the ER for further evaluation.     ED Prescriptions   None    PDMP not reviewed this encounter.   Rondel Oh, MD 10/05/22 1424

## 2022-10-08 LAB — CERVICOVAGINAL ANCILLARY ONLY
Bacterial Vaginitis (gardnerella): NEGATIVE
Candida Glabrata: NEGATIVE
Candida Vaginitis: NEGATIVE
Chlamydia: NEGATIVE
Comment: NEGATIVE
Comment: NEGATIVE
Comment: NEGATIVE
Comment: NEGATIVE
Comment: NEGATIVE
Comment: NORMAL
Neisseria Gonorrhea: NEGATIVE
Trichomonas: NEGATIVE

## 2022-11-01 DIAGNOSIS — F39 Unspecified mood [affective] disorder: Secondary | ICD-10-CM | POA: Diagnosis not present

## 2022-11-01 DIAGNOSIS — Z79899 Other long term (current) drug therapy: Secondary | ICD-10-CM | POA: Diagnosis not present

## 2022-11-01 DIAGNOSIS — Z21 Asymptomatic human immunodeficiency virus [HIV] infection status: Secondary | ICD-10-CM | POA: Diagnosis not present

## 2022-11-09 ENCOUNTER — Encounter: Payer: Self-pay | Admitting: Nurse Practitioner

## 2022-11-09 ENCOUNTER — Ambulatory Visit (INDEPENDENT_AMBULATORY_CARE_PROVIDER_SITE_OTHER): Payer: Medicaid Other | Admitting: Nurse Practitioner

## 2022-11-09 VITALS — BP 115/62 | HR 87 | Temp 99.1°F | Wt 133.6 lb

## 2022-11-09 DIAGNOSIS — R1011 Right upper quadrant pain: Secondary | ICD-10-CM | POA: Diagnosis not present

## 2022-11-09 DIAGNOSIS — N939 Abnormal uterine and vaginal bleeding, unspecified: Secondary | ICD-10-CM | POA: Diagnosis not present

## 2022-11-09 DIAGNOSIS — Z7251 High risk heterosexual behavior: Secondary | ICD-10-CM | POA: Diagnosis not present

## 2022-11-09 NOTE — Progress Notes (Signed)
Established Patient Office Visit  Subjective:  Patient ID: Cassie Nguyen, female    DOB: 07-10-1996  Age: 26 y.o. MRN: [id number]  CC:  Chief Complaint  Patient presents with   Follow-up    Kidney stone and still bleeding from cycle     HPI Cassie Nguyen is a 26 y.o. female with past medical history of abnormal uterine bleeding, cervical dysplasia presents for follow-up for ED visit for right lower quadrant abdominal pain on October 05, 2022.  She states that her abdominal pain is intermittent located to the right side of her abdomen, x-ray of the abdomen showed possible kidney stone but was not definitive.  SHe is also having abnormal vaginal bleeding stated her period   Started on October 09, 2022 and up to now she is still having vaginal bleeding.  She has a history of cyst in her right ovary last ultrasound was done in 2022.  Has urinary frequency but denies dysuria, rashes, fever, chills, nausea, vomiting.  She has had 4 sexual partners in the past 1 year and would like STD testing.   Patient of Newlin MD.     Past Medical History:  Diagnosis Date   Asthma    Chlamydia    Gonorrhea    Trichomonas infection     Past Surgical History:  Procedure Laterality Date   ROOT CANAL      Family History  Problem Relation Age of Onset   Cancer Mother     Social History   Socioeconomic History   Marital status: Single    Spouse name: Not on file   Number of children: Not on file   Years of education: Not on file   Highest education level: Not on file  Occupational History   Not on file  Tobacco Use   Smoking status: Never   Smokeless tobacco: Never  Vaping Use   Vaping Use: Never used  Substance and Sexual Activity   Alcohol use: Not Currently    Comment: occasionally   Drug use: Yes    Types: Marijuana   Sexual activity: Yes    Birth control/protection: Condom  Other Topics Concern   Not on file  Social History Narrative   Not on file    Social Determinants of Health   Financial Resource Strain: Not on file  Food Insecurity: No Food Insecurity (05/02/2021)   Hunger Vital Sign    Worried About Running Out of Food in the Last Year: Never true    Ran Out of Food in the Last Year: Never true  Transportation Needs: No Transportation Needs (05/02/2021)   PRAPARE - Hydrologist (Medical): No    Lack of Transportation (Non-Medical): No  Physical Activity: Not on file  Stress: Not on file  Social Connections: Not on file  Intimate Partner Violence: Not on file    Outpatient Medications Prior to Visit  Medication Sig Dispense Refill   cyclobenzaprine (FLEXERIL) 10 MG tablet Take 1 tablet (10 mg total) by mouth 2 (two) times daily as needed for muscle spasms. 20 tablet 0   albuterol (VENTOLIN HFA) 108 (90 Base) MCG/ACT inhaler Inhale 1-2 puffs into the lungs every 6 (six) hours as needed for wheezing or shortness of breath. (Patient not taking: Reported on 07/11/2022) 1 each 1   albuterol (VENTOLIN HFA) 108 (90 Base) MCG/ACT inhaler Inhale 1-2 puffs into the lungs every 6 (six) hours as needed for wheezing or shortness of breath. (Patient not taking:  Reported on 11/09/2022) 1 each 0   fluconazole (DIFLUCAN) 150 MG tablet Take 1 tablet (150 mg total) by mouth every three (3) days as needed. Repeat if needed (Patient not taking: Reported on 11/09/2022) 2 tablet 0   metroNIDAZOLE (FLAGYL) 500 MG tablet Take 1 tablet (500 mg total) by mouth 2 (two) times daily. (Patient not taking: Reported on 11/09/2022) 14 tablet 0   No facility-administered medications prior to visit.    Allergies  Allergen Reactions   Peanut-Containing Drug Products Anaphylaxis   Shrimp [Shellfish Allergy] Anaphylaxis    Makes throat itchy and dry   Watermelon Flavor Anaphylaxis   Lactase Other (See Comments)    Constipation, gi upset    ROS Review of Systems  Constitutional:  Negative for activity change, appetite change,  chills, diaphoresis and fatigue.  Respiratory:  Negative for cough, chest tightness, shortness of breath and wheezing.   Cardiovascular: Negative.  Negative for chest pain, palpitations and leg swelling.  Gastrointestinal:  Positive for abdominal pain. Negative for abdominal distention, anal bleeding, blood in stool, constipation, diarrhea and nausea.  Genitourinary:  Positive for frequency and menstrual problem. Negative for difficulty urinating, dyspareunia, dysuria, enuresis, flank pain, genital sores, pelvic pain and urgency.  Neurological: Negative.  Negative for dizziness, facial asymmetry and light-headedness.  Psychiatric/Behavioral:  Negative for agitation, behavioral problems, confusion and decreased concentration.       Objective:    Physical Exam Constitutional:      General: She is not in acute distress.    Appearance: Normal appearance. She is not ill-appearing, toxic-appearing or diaphoretic.  Eyes:     General: No scleral icterus.       Right eye: No discharge.        Left eye: No discharge.     Extraocular Movements: Extraocular movements intact.     Conjunctiva/sclera: Conjunctivae normal.     Pupils: Pupils are equal, round, and reactive to light.  Cardiovascular:     Rate and Rhythm: Normal rate and regular rhythm.     Pulses: Normal pulses.     Heart sounds: Normal heart sounds. No murmur heard.    No friction rub. No gallop.  Pulmonary:     Effort: Pulmonary effort is normal. No respiratory distress.     Breath sounds: Normal breath sounds. No stridor. No wheezing, rhonchi or rales.  Chest:     Chest wall: No tenderness.  Abdominal:     General: Abdomen is flat. There is no distension.     Palpations: There is no mass.     Tenderness: There is no abdominal tenderness. There is no right CVA tenderness, left CVA tenderness, guarding or rebound.     Hernia: No hernia is present.  Musculoskeletal:        General: No swelling, tenderness, deformity or signs of  injury.     Right lower leg: No edema.     Left lower leg: No edema.  Skin:    Capillary Refill: Capillary refill takes less than 2 seconds.     Coloration: Skin is not jaundiced.     Findings: No bruising or lesion.  Neurological:     Mental Status: She is alert and oriented to person, place, and time.     Cranial Nerves: No cranial nerve deficit.     Sensory: No sensory deficit.     Motor: No weakness.     Gait: Gait normal.  Psychiatric:        Mood and Affect: Mood normal.  Behavior: Behavior normal.        Thought Content: Thought content normal.        Judgment: Judgment normal.     BP 115/62   Pulse 87   Temp 99.1 F (37.3 C)   Wt 133 lb 9.6 oz (60.6 kg)   LMP 10/09/2022   SpO2 99%   BMI 23.67 kg/m  Wt Readings from Last 3 Encounters:  11/09/22 133 lb 9.6 oz (60.6 kg)  07/11/22 129 lb 9.6 oz (58.8 kg)  04/13/22 128 lb 15.5 oz (58.5 kg)    Lab Results  Component Value Date   TSH 0.752 06/25/2019   Lab Results  Component Value Date   WBC 5.8 10/02/2021   HGB 11.0 (L) 10/02/2021   HCT 32.0 (L) 10/02/2021   MCV 91.7 10/02/2021   PLT 307 10/02/2021   Lab Results  Component Value Date   NA 138 10/02/2021   K 4.1 10/02/2021   CO2 28 10/02/2021   GLUCOSE 95 10/02/2021   BUN 6 10/02/2021   CREATININE 0.63 10/02/2021   BILITOT 0.8 09/17/2021   ALKPHOS 43 09/17/2021   AST 19 09/17/2021   ALT 11 09/17/2021   PROT 7.0 09/17/2021   ALBUMIN 4.0 09/17/2021   CALCIUM 9.6 10/02/2021   ANIONGAP 4 (L) 10/02/2021   No results found for: "CHOL" No results found for: "HDL" No results found for: "LDLCALC" No results found for: "TRIG" No results found for: "CHOLHDL" No results found for: "HGBA1C"    Assessment & Plan:   Problem List Items Addressed This Visit       Other   Right upper quadrant abdominal pain - Primary    Abdominal x-rays showed possible renal stone but was not definitive On renal ultrasound ordered today to rule out kidney  stones Patient encouraged to drink at least 64 ounces of water daily to maintain hydration       Relevant Orders   US Renal   CBC   CMP14+EGFR   High risk heterosexual behavior    Patient is requesting for STD testing today She has had multiple partners in the past 1 year Need to prevent sexually transmitted infection discussed with patient patient she was encouraged to maintain 1 sexual partner.  Screen  for Hep c, nuwaswab vaginits plus ordered.       Relevant Orders   Hepatitis C antibody   NuSwab Vaginitis Plus (VG+)   Abnormal vaginal bleeding    Has been on her menstrual period for the past 30 days , has intermittent abdominal pain Will check Renal US to rule out kidney stone.  Also has history of right ovary cyst, patient encouraged to follow up with obgyn.  Check CBC, CMP      Relevant Orders   CBC   CMP14+EGFR    No orders of the defined types were placed in this encounter.   Follow-up: Return in about 3 months (around 02/09/2023).    Renee Rival, FNP

## 2022-11-09 NOTE — Assessment & Plan Note (Deleted)
Has been on her menstrual period for the past 30 days , has intermittent abdominal pain Will check Renal US to rule out kidney stone.  Also has history of right ovary cyst, patient encouraged to follow up with obgyn.

## 2022-11-09 NOTE — Assessment & Plan Note (Signed)
Abdominal x-rays showed possible renal stone but was not definitive On renal ultrasound ordered today to rule out kidney stones Patient encouraged to drink at least 64 ounces of water daily to maintain hydration

## 2022-11-09 NOTE — Assessment & Plan Note (Addendum)
Patient is requesting for STD testing today She has had multiple partners in the past 1 year Need to prevent sexually transmitted infection discussed with patient patient she was encouraged to maintain 1 sexual partner.  Screen  for Hep c, nuwaswab vaginits plus ordered.

## 2022-11-09 NOTE — Assessment & Plan Note (Addendum)
Has been on her menstrual period for the past 30 days , has intermittent abdominal pain Will check Renal US to rule out kidney stone.  Also has history of right ovary cyst, patient encouraged to follow up with obgyn.  Check CBC, CMP

## 2022-11-09 NOTE — Patient Instructions (Addendum)
Please call the OBGYN office for your abnormal vaginal bleeding [phone]  Get your kidney  ultrasound done as dicussed   It is important that you exercise regularly at least 30 minutes 5 times a week as tolerated  Think about what you will eat, plan ahead. Choose " clean, green, fresh or frozen" over canned, processed or packaged foods which are more sugary, salty and fatty. 70 to 75% of food eaten should be vegetables and fruit. Three meals at set times with snacks allowed between meals, but they must be fruit or vegetables. Aim to eat over a 12 hour period , example 7 am to 7 pm, and STOP after  your last meal of the day. Drink water,generally about 64 ounces per day, no other drink is as healthy. Fruit juice is best enjoyed in a healthy way, by EATING the fruit.  Thanks for choosing [facility] we consider it a privelige to serve you.

## 2022-11-12 ENCOUNTER — Ambulatory Visit (HOSPITAL_COMMUNITY)
Admission: RE | Admit: 2022-11-12 | Discharge: 2022-11-12 | Disposition: A | Discharge status: Home / Self Care | Payer: Medicaid Other | Source: Ambulatory Visit | Attending: Nurse Practitioner | Admitting: Nurse Practitioner

## 2022-11-12 ENCOUNTER — Other Ambulatory Visit: Payer: Self-pay | Admitting: Nurse Practitioner

## 2022-11-12 DIAGNOSIS — Z7251 High risk heterosexual behavior: Secondary | ICD-10-CM | POA: Diagnosis not present

## 2022-11-12 DIAGNOSIS — R1011 Right upper quadrant pain: Secondary | ICD-10-CM | POA: Diagnosis not present

## 2022-11-12 DIAGNOSIS — N200 Calculus of kidney: Secondary | ICD-10-CM | POA: Diagnosis not present

## 2022-11-12 DIAGNOSIS — N939 Abnormal uterine and vaginal bleeding, unspecified: Secondary | ICD-10-CM | POA: Diagnosis not present

## 2022-11-12 NOTE — Progress Notes (Signed)
Normal [place], no kidney stone found

## 2022-11-13 ENCOUNTER — Other Ambulatory Visit: Payer: Self-pay | Admitting: Nurse Practitioner

## 2022-11-13 ENCOUNTER — Telehealth: Payer: Self-pay | Admitting: *Deleted

## 2022-11-13 DIAGNOSIS — R1011 Right upper quadrant pain: Secondary | ICD-10-CM

## 2022-11-13 LAB — CBC
Hematocrit: 43.9 % (ref 34.0–46.6)
Hemoglobin: 14.8 g/dL (ref 11.1–15.9)
MCH: 31.7 pg (ref 26.6–33.0)
MCHC: 33.7 g/dL (ref 31.5–35.7)
MCV: 94 fL (ref 79–97)
Platelets: 279 10*3/uL (ref 150–450)
RBC: 4.67 x10E6/uL (ref 3.77–5.28)
RDW: 11.9 % (ref 11.7–15.4)
WBC: 3.9 10*3/uL (ref 3.4–10.8)

## 2022-11-13 LAB — CMP14+EGFR
ALT: 49 IU/L — ABNORMAL HIGH (ref 0–32)
AST: 273 IU/L — ABNORMAL HIGH (ref 0–40)
Albumin/Globulin Ratio: 1.7 (ref 1.2–2.2)
Albumin: 4.5 g/dL (ref 4.0–5.0)
Alkaline Phosphatase: 72 IU/L (ref 44–121)
BUN/Creatinine Ratio: 5 — ABNORMAL LOW (ref 9–23)
BUN: 4 mg/dL — ABNORMAL LOW (ref 6–20)
Bilirubin Total: 0.2 mg/dL (ref 0.0–1.2)
CO2: 21 mmol/L (ref 20–29)
Calcium: 9.1 mg/dL (ref 8.7–10.2)
Chloride: 104 mmol/L (ref 96–106)
Creatinine, Ser: 0.79 mg/dL (ref 0.57–1.00)
Globulin, Total: 2.6 g/dL (ref 1.5–4.5)
Glucose: 87 mg/dL (ref 70–99)
Potassium: 5 mmol/L (ref 3.5–5.2)
Sodium: 140 mmol/L (ref 134–144)
Total Protein: 7.1 g/dL (ref 6.0–8.5)
eGFR: 107 mL/min/{1.73_m2} (ref >59)

## 2022-11-13 NOTE — Progress Notes (Signed)
Liver enzymes is elevated, I have ordered abdominal US to rule out gallbladder or liver problem, avoid alcohol and excessive use of tylenol.

## 2022-11-13 NOTE — Telephone Encounter (Signed)
Spoke to pt stated--having heavy mensuration that have not stop since last month. Please advise

## 2022-11-14 ENCOUNTER — Other Ambulatory Visit: Payer: Self-pay | Admitting: Nurse Practitioner

## 2022-11-14 DIAGNOSIS — N760 Acute vaginitis: Secondary | ICD-10-CM

## 2022-11-14 LAB — NUSWAB VAGINITIS PLUS (VG+)
Atopobium vaginae: HIGH Score — AB
BVAB 2: HIGH Score — AB
Candida albicans, NAA: NEGATIVE
Candida glabrata, NAA: NEGATIVE
Chlamydia trachomatis, NAA: NEGATIVE
Megasphaera 1: HIGH Score — AB
Neisseria gonorrhoeae, NAA: NEGATIVE
Trich vag by NAA: NEGATIVE

## 2022-11-14 MED ORDER — CLINDAMYCIN HCL 300 MG PO CAPS: 300.0000 mg | ORAL_CAPSULE | Freq: Two times a day (BID) | ORAL | 0 refills | Status: AC

## 2022-11-14 NOTE — Progress Notes (Signed)
Positive for BV. Start clindamycin 300mg  BID for 7 days.  Avoid bubble bath, douching

## 2022-11-15 LAB — CHLAMYDIA/GC NAA, CONFIRMATION
Chlamydia trachomatis, NAA: NEGATIVE
Neisseria gonorrhoeae, NAA: NEGATIVE

## 2022-11-20 ENCOUNTER — Ambulatory Visit (HOSPITAL_COMMUNITY)
Admission: RE | Admit: 2022-11-20 | Discharge: 2022-11-20 | Disposition: A | Discharge status: Home / Self Care | Payer: Medicaid Other | Source: Ambulatory Visit | Attending: Nurse Practitioner | Admitting: Nurse Practitioner

## 2022-11-20 ENCOUNTER — Encounter (HOSPITAL_COMMUNITY): Payer: Self-pay

## 2022-11-20 DIAGNOSIS — M79602 Pain in left arm: Secondary | ICD-10-CM

## 2022-11-20 DIAGNOSIS — R29898 Other symptoms and signs involving the musculoskeletal system: Secondary | ICD-10-CM | POA: Diagnosis not present

## 2022-11-20 DIAGNOSIS — M79601 Pain in right arm: Secondary | ICD-10-CM

## 2022-11-20 DIAGNOSIS — R202 Paresthesia of skin: Secondary | ICD-10-CM | POA: Diagnosis not present

## 2022-11-20 NOTE — Discharge Instructions (Signed)
-   Please go directly to the Emergency Room for further evaluation of your symptoms  
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
## 2023-01-24 ENCOUNTER — Ambulatory Visit (HOSPITAL_COMMUNITY): Payer: Self-pay

## 2023-01-24 ENCOUNTER — Ambulatory Visit (HOSPITAL_COMMUNITY)
Admission: EM | Admit: 2023-01-24 | Discharge: 2023-01-24 | Disposition: A | Discharge status: Home / Self Care | Payer: Medicaid Other | Attending: Internal Medicine | Admitting: Internal Medicine

## 2023-01-24 ENCOUNTER — Ambulatory Visit (HOSPITAL_COMMUNITY): Payer: Medicaid Other

## 2023-01-24 ENCOUNTER — Encounter (HOSPITAL_COMMUNITY): Payer: Self-pay | Admitting: Emergency Medicine

## 2023-01-24 DIAGNOSIS — Z3202 Encounter for pregnancy test, result negative: Secondary | ICD-10-CM | POA: Diagnosis present

## 2023-01-24 DIAGNOSIS — Z202 Contact with and (suspected) exposure to infections with a predominantly sexual mode of transmission: Secondary | ICD-10-CM

## 2023-01-24 DIAGNOSIS — N939 Abnormal uterine and vaginal bleeding, unspecified: Secondary | ICD-10-CM | POA: Diagnosis present

## 2023-01-24 LAB — POC URINE PREG, ED: Preg Test, Ur: NEGATIVE

## 2023-01-24 NOTE — ED Provider Notes (Signed)
[facility]    CSN: [id number] Arrival date & time: 01/24/23  1900      History   Chief Complaint Chief Complaint  Patient presents with   SEXUALLY TRANSMITTED DISEASE   appt 7    HPI Cassie Nguyen is a 27 y.o. female.   Patient presents to urgent care for evaluation of irregular menstrual bleeding. States her menstrual cycle happened January 5-10th, 2024 earlier this month. Menstrual cycle ended, but then started again a few days later on January 15th and she has been intermittently spotting ever since. She started wearing a peri pad again and has noticed brown blood to the peri pad intermittently over the last week. She is not experiencing any dark red menstrual bleeding and denies history of irregular menses. Denies abdominal pain, dyspareunia, pelvic pain, chest pain/shortness of breath, fatigue, history of anemia, urinary symptoms, gross hematuria, vaginal discharge, vaginal rash, and vaginal odor.  No recent new sexual partners.  She is sexually active with males without condoms.  She has never been diagnosed with any gynecologic problems in the past.  No known exposures to STDs.  She would like to be screened for STDs today. She has not attempted use of any OTC medications prior to arrival at urgent care.     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 Authorizing Provider  albuterol (VENTOLIN HFA) 108 (90 Base) MCG/ACT inhaler Inhale 1-2 puffs into the lungs every 6 (six) hours as needed for wheezing or shortness of breath. Patient not taking: Reported on 07/11/2022 05/10/22   Mardella Layman, MD  albuterol (VENTOLIN HFA) 108 (90 Base) MCG/ACT inhaler Inhale 1-2 puffs into the lungs every 6 (six) hours as needed for wheezing or shortness of breath. Patient not taking: Reported on 11/09/2022 09/09/22   Gustavus Bryant, FNP  cyclobenzaprine (FLEXERIL) 10 MG tablet Take 1 tablet (10 mg total) by mouth 2 (two) times daily as needed for muscle spasms. 08/31/22   White, Elita Boone, NP  medroxyPROGESTERone (PROVERA) 10 MG tablet Take 1 tablet (10 mg total) by mouth daily. Patient not taking: Reported on 07/20/2019 06/25/19 02/26/20  Merrilee Jansky, MD    Family History Family History  Problem Relation Age of Onset   Cancer Mother     Social History Social History   Tobacco Use   Smoking status: Never   Smokeless tobacco:  Never  Vaping Use   Vaping Use: Never used  Substance Use Topics   Alcohol use: Not Currently    Comment: occasionally   Drug use: Yes    Types: Marijuana     Allergies   Peanut-containing drug products, Shrimp [shellfish allergy], Watermelon flavor, and Lactase   Review of Systems Review of Systems Per HPI  Physical Exam Triage Vital Signs ED Triage Vitals  Enc Vitals Group     BP 01/24/23 1929 107/77     Pulse Rate 01/24/23 1929 75     Resp 01/24/23 1929 14     Temp 01/24/23 1929 98.3 F (36.8 C)     Temp src --      SpO2 01/24/23 1929 99 %     Weight --      Height --      Head Circumference --      Peak Flow --      Pain Score 01/24/23 1928 0     Pain Loc --      Pain Edu? --      Excl. in [place]? --     No data found.  Updated Vital Signs BP 107/77 (BP Location: Right Arm)   Pulse 75   Temp 98.3 F (36.8 C)   Resp 14   LMP 11/30/2022   SpO2 99%   Visual Acuity Right Eye Distance:   Left Eye Distance:   Bilateral Distance:    Right Eye Near:   Left Eye Near:    Bilateral Near:     Physical Exam Vitals and nursing note reviewed.  Constitutional:      Appearance: She is not ill-appearing or toxic-appearing.  HENT:     Head: Normocephalic and atraumatic.     Right Ear: Hearing and external ear normal.     Left Ear: Hearing and external ear normal.     Nose: Nose normal.     Mouth/Throat:     Lips: Pink.     Mouth: Mucous membranes are moist.  Eyes:     General: Lids are normal. Vision grossly intact. Gaze aligned appropriately.     Extraocular Movements: Extraocular movements intact.     Conjunctiva/sclera: Conjunctivae normal.  Pulmonary:     Effort: Pulmonary effort is normal.  Abdominal:     General: Bowel sounds are normal.     Palpations: Abdomen is soft.     Tenderness: There is no abdominal tenderness. There is no right CVA tenderness, left CVA tenderness or guarding.  Musculoskeletal:     Cervical back: Neck supple.  Skin:    General: Skin is warm and dry.     Capillary Refill: Capillary refill takes less than 2 seconds.     Findings: No rash.  Neurological:     General: No focal deficit present.     Mental Status: She is alert and oriented to person, place, and time. Mental status is at baseline.     Cranial Nerves: No dysarthria or facial asymmetry.  Psychiatric:        Mood and Affect: Mood normal.        Speech: Speech normal.        Behavior: Behavior normal.        Thought Content: Thought content normal.        Judgment: Judgment normal.      UC Treatments / Results  Labs (all labs ordered are listed, but only abnormal results are displayed) Labs Reviewed  POC URINE PREG, ED  CERVICOVAGINAL ANCILLARY ONLY    EKG   Radiology No  results found.  Procedures Procedures (including critical care time)  Medications Ordered in UC Medications - No data to display  Initial Impression / Assessment and Plan / UC Course  I have reviewed the triage vital signs and the nursing notes.  Pertinent labs & imaging results that were available during my care of the patient were reviewed by me and considered in my medical decision making (see chart for details).   1. Possible exposure to STD, vaginal bleeding Unclear etiology of irregular menstrual bleeding.  Patient is requesting the name of her OB/GYN so that she is able to call and schedule a follow-up appointment.  Patient may follow-up with [facility] for ongoing evaluation of vaginal bleeding.  Urine pregnancy test is negative in clinic.  Patient does does not describe heavy and persistent menstrual bleeding, therefore we will defer Provera prescription. Advised to drink plenty of water to stay well hydrated. She is nontoxic in appearance with hemodynamically stable vital signs.  STI labs pending.  Patient declines HIV and syphilis testing today.  Will notify patient of positive results and treat accordingly when labs come back.  Patient to avoid sexual intercourse until screening testing comes back.  Education provided regarding safe sexual practices and patient encouraged to use protection to prevent spread of STIs.   Discussed physical exam and available lab work findings in clinic with patient.  Counseled patient regarding appropriate use of medications and potential side effects for all medications recommended or prescribed today. Discussed red flag signs and symptoms of worsening condition,when to call the PCP office, return to urgent care, and when to seek higher level of care in the emergency department. Patient verbalizes understanding and agreement with plan. All questions answered. Patient discharged in stable condition.    Final Clinical Impressions(s) /  UC Diagnoses   Final diagnoses:  Possible exposure to STD     Discharge Instructions      Your STD testing has been sent to the lab and will come back in the next 2 to 3 days.  We will call you if any of your results are positive requiring treatment and treat you at that time.   If you do not receive a phone call from [place], this means your testing was negative.  Avoid sexual intercourse until your STD results come back.  If any of your STD results are positive, you will need to avoid sexual intercourse for 7 days while you are being treated to prevent spread of STD.  Condom use is the best way to prevent spread of STDs.  Please follow-up with Dr. Elly Modena [facility] and call the number listed on the paperwork for ongoing evaluation of vaginal bleeding.   Return to urgent care as needed.       ED Prescriptions   None    PDMP not reviewed this encounter.   Talbot Grumbling, [place] 01/28/23 [id number]

## 2023-01-24 NOTE — Discharge Instructions (Signed)
Your STD testing has been sent to the lab and will come back in the next 2 to 3 days.  We will call you if any of your results are positive requiring treatment and treat you at that time.   If you do not receive a phone call from [place], this means your testing was negative.  Avoid sexual intercourse until your STD results come back.  If any of your STD results are positive, you will need to avoid sexual intercourse for 7 days while you are being treated to prevent spread of STD.  Condom use is the best way to prevent spread of STDs.  Please follow-up with Dr. Elly Modena [facility] and call the number listed on the paperwork for ongoing evaluation of vaginal bleeding.   Return to urgent care as needed.

## 2023-01-24 NOTE — ED Triage Notes (Signed)
Pt requesting STD testing denies exposure or symptoms.  Pt reports that started her menstrual beginning of December and still bleeding. Denies having to wear a pad or tampon for amount of bleeding.  Pt wanting to know name of her OB-GYN.

## 2023-01-29 LAB — CERVICOVAGINAL ANCILLARY ONLY
Bacterial Vaginitis (gardnerella): NEGATIVE
Candida Glabrata: NEGATIVE
Candida Vaginitis: NEGATIVE
Chlamydia: NEGATIVE
Comment: NEGATIVE
Comment: NEGATIVE
Comment: NEGATIVE
Comment: NEGATIVE
Comment: NEGATIVE
Comment: NORMAL
Neisseria Gonorrhea: NEGATIVE
Trichomonas: NEGATIVE

## 2023-02-04 ENCOUNTER — Encounter: Payer: Self-pay | Admitting: Emergency Medicine

## 2023-02-04 ENCOUNTER — Ambulatory Visit
Admission: EM | Admit: 2023-02-04 | Discharge: 2023-02-04 | Disposition: A | Discharge status: Home / Self Care | Payer: Medicaid Other | Attending: Internal Medicine | Admitting: Internal Medicine

## 2023-02-04 DIAGNOSIS — H00011 Hordeolum externum right upper eyelid: Secondary | ICD-10-CM

## 2023-02-04 MED ORDER — ERYTHROMYCIN 5 MG/GM OP OINT: TOPICAL_OINTMENT | OPHTHALMIC | 0 refills | Status: DC

## 2023-02-04 NOTE — ED Triage Notes (Signed)
Patient c/o right eye swelling this morning, eye was matted w/drainage.

## 2023-02-04 NOTE — Discharge Instructions (Signed)
It appears that you have a stye of your eye.  Use warm compresses.  Also prescribed topical antibiotic which should be helpful.  Follow-up with eye doctor if symptoms persist or worsen.

## 2023-02-04 NOTE — ED Notes (Signed)
Called for patient in lobby, no answer 

## 2023-02-04 NOTE — ED Provider Notes (Signed)
[name]    CSN: [id number] Arrival date & time: 02/04/23  1709      History   Chief Complaint Chief Complaint  Patient presents with   Facial Swelling    HPI Cassie Nguyen is a 27 y.o. female.   Patient presents with right upper eyelid swelling and irritation that started today.  Reports crustiness to the eye upon awakening this morning.  Denies trauma or foreign body to the eye.  Does not report wearing contacts or glasses.  Denies any vision changes.     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 Authorizing Provider  erythromycin ophthalmic ointment Place a 1/2 inch ribbon of ointment into the lower eyelid 4 times daily. 02/04/23  Yes Anayia Eugene, Michele Rockers, FNP  albuterol (VENTOLIN HFA) 108 (90 Base) MCG/ACT inhaler Inhale 1-2 puffs into the lungs every 6 (six) hours as needed for wheezing or shortness of breath. Patient not taking: Reported on 07/11/2022 05/10/22   Vanessa Kick, MD  albuterol (VENTOLIN HFA) 108 (90 Base) MCG/ACT inhaler Inhale 1-2 puffs into the lungs every 6 (six) hours as needed for wheezing or shortness of breath. Patient not taking:  Reported on 11/09/2022 09/09/22   Teodora Medici, FNP  cyclobenzaprine (FLEXERIL) 10 MG tablet Take 1 tablet (10 mg total) by mouth 2 (two) times daily as needed for muscle spasms. 08/31/22   White, Leitha Schuller, NP  medroxyPROGESTERone (PROVERA) 10 MG tablet Take 1 tablet (10 mg total) by mouth daily. Patient not taking: Reported on 07/20/2019 06/25/19 02/26/20  Chase Picket, MD    Family History Family History  Problem Relation Age of Onset   Cancer Mother     Social History Social History   Tobacco Use   Smoking status: Never   Smokeless tobacco: Never  Vaping Use   Vaping Use: Never used  Substance Use Topics   Alcohol use: Yes    Comment: occasionally   Drug use: Yes    Types: Marijuana     Allergies   Peanut-containing drug products, Shrimp [shellfish allergy], Watermelon flavor, and Lactase   Review of Systems Review of Systems Per HPI  Physical Exam Triage Vital Signs ED Triage Vitals  Enc Vitals Group     BP 02/04/23 1757 119/64     Pulse Rate 02/04/23 1757 82     Resp 02/04/23 1757 18  Temp 02/04/23 1757 98.1 F (36.7 C)     Temp Source 02/04/23 1757 Oral     SpO2 02/04/23 1757 98 %     Weight 02/04/23 1759 128 lb (58.1 kg)     Height 02/04/23 1759 5\' 4"  (1.626 m)     Head Circumference --      Peak Flow --      Pain Score 02/04/23 1759 8     Pain Loc --      Pain Edu? --      Excl. in [place]? --    No data found.  Updated Vital Signs BP 119/64 (BP Location: Left Arm)   Pulse 82   Temp 98.1 F (36.7 C) (Oral)   Resp 18   Ht 5\' 4"  (1.626 m)   Wt 128 lb (58.1 kg)   LMP 01/04/2023   SpO2 98%   BMI 21.97 kg/m   Visual Acuity Right Eye Distance:   Left Eye Distance:   Bilateral Distance:    Right Eye Near:   Left Eye Near:    Bilateral Near:     Physical Exam Constitutional:      General: She is not in acute distress.    Appearance: Normal appearance. She is not toxic-appearing or diaphoretic.  HENT:     Head: Normocephalic and  atraumatic.  Eyes:     General:        Right eye: Hordeolum present.     Extraocular Movements: Extraocular movements intact.     Conjunctiva/sclera: Conjunctivae normal.     Pupils: Pupils are equal, round, and reactive to light.      Comments: Small stye present to right upper eyelid at the left inner corner.  No drainage noted.  Eyeball appears normal.  Pulmonary:     Effort: Pulmonary effort is normal.  Neurological:     General: No focal deficit present.     Mental Status: She is alert and oriented to person, place, and time. Mental status is at baseline.  Psychiatric:        Mood and Affect: Mood normal.        Behavior: Behavior normal.        Thought Content: Thought content normal.        Judgment: Judgment normal.      UC Treatments / Results  Labs (all labs ordered are listed, but only abnormal results are displayed) Labs Reviewed - No data to display  EKG   Radiology No results found.  Procedures Procedures (including critical care time)  Medications Ordered in UC Medications - No data to display  Initial Impression / Assessment and Plan / UC Course  I have reviewed the triage vital signs and the nursing notes.  Pertinent labs & imaging results that were available during my care of the patient were reviewed by me and considered in my medical decision making (see chart for details).     Patient has stye to right upper eyelid.  Will treat with warm compresses and erythromycin ointment.  Visual acuity appears normal.  Patient advised to follow-up with eye doctor at provided contact information if symptoms persist or worsen.  Discussed return precautions.  Patient verbalized understanding and was agreeable with plan. Final Clinical Impressions(s) / UC Diagnoses   Final diagnoses:  Hordeolum externum of right upper eyelid     Discharge Instructions      It appears that you have a stye of your eye.  Use warm compresses.  Also prescribed topical  antibiotic which should be helpful.  Follow-up with eye doctor if symptoms persist or worsen.    ED Prescriptions     Medication Sig Dispense Auth. Provider   erythromycin ophthalmic ointment Place a 1/2 inch ribbon of ointment into the lower eyelid 4 times daily. 3.5 g Teodora Medici, [place]      PDMP not reviewed this encounter.   Teodora Medici, [place] 02/04/23 [phone]

## 2023-02-08 ENCOUNTER — Ambulatory Visit (INDEPENDENT_AMBULATORY_CARE_PROVIDER_SITE_OTHER): Payer: Medicaid Other | Admitting: Nurse Practitioner

## 2023-02-08 VITALS — BP 93/58 | HR 67 | Temp 97.3°F | Ht 63.0 in | Wt 131.4 lb

## 2023-02-08 DIAGNOSIS — R748 Abnormal levels of other serum enzymes: Secondary | ICD-10-CM

## 2023-02-08 DIAGNOSIS — R1011 Right upper quadrant pain: Secondary | ICD-10-CM

## 2023-02-08 DIAGNOSIS — Z7251 High risk heterosexual behavior: Secondary | ICD-10-CM

## 2023-02-08 NOTE — Assessment & Plan Note (Signed)
Patient currently denies abdominal pain.

## 2023-02-08 NOTE — Assessment & Plan Note (Signed)
Had STD testing done few days ago results with normal She was encouraged to avoid high risk sexual behavior to prevent risk of getting STDs She was offered condoms in the office today

## 2023-02-08 NOTE — Progress Notes (Signed)
Established Patient Office Visit  Subjective:  Patient ID: Cassie Nguyen, female    DOB: [birth year]  Age: 27 y.o. MRN: [id number]  CC:  No chief complaint on file.   HPI Cassie Nguyen is a 27 y.o. female with past medical history of abnormal uterine bleeding, cervical dysplasia presents for follow-up for her abdominal pain and elevated liver enzymes.  Today she denies abdominal pain nausea, vomiting, constipation, fever, chills, malaise. States that she drinks alcohol socially and does not use excessive tylenol.   She would like to be tested for STDs reports that she currently has 2 sexual partners, 46 female and 1 female, she has been having unprotected sex but denies any symptoms.  She recently had STD screening which was negative.  I encouraged the patient to avoid high risk sexual behavior she was offered condom in the office today.  She was encouraged to follow-up with her PCP Dr. Margarita Rana In July for annual physical.     Past Medical History:  Diagnosis Date   Asthma    Chlamydia    Gonorrhea    Trichomonas infection     Past Surgical History:  Procedure Laterality Date   ROOT CANAL      Family History  Problem Relation Age of Onset   Cancer Mother     Social History   Socioeconomic History   Marital status: Single    Spouse name: Not on file   Number of children: Not on file   Years of education: Not on file   Highest education level: Not on file  Occupational History   Not on file  Tobacco Use   Smoking status: Never   Smokeless tobacco: Never  Vaping Use   Vaping Use: Never used  Substance and Sexual Activity   Alcohol use: Yes    Comment: occasionally   Drug use: Yes    Types: Marijuana   Sexual activity: Yes    Birth control/protection: Condom  Other Topics Concern   Not on file  Social History Narrative   Not on file   Social Determinants of Health   Financial Resource Strain: Not on file  Food Insecurity: No Food Insecurity  (05/02/2021)   Hunger Vital Sign    Worried About Running Out of Food in the Last Year: Never true    Ran Out of Food in the Last Year: Never true  Transportation Needs: No Transportation Needs (05/02/2021)   PRAPARE - Hydrologist (Medical): No    Lack of Transportation (Non-Medical): No  Physical Activity: Not on file  Stress: Not on file  Social Connections: Not on file  Intimate Partner Violence: Not on file    Outpatient Medications Prior to Visit  Medication Sig Dispense Refill   albuterol (VENTOLIN HFA) 108 (90 Base) MCG/ACT inhaler Inhale 1-2 puffs into the lungs every 6 (six) hours as needed for wheezing or shortness of breath. 1 each 1   albuterol (VENTOLIN HFA) 108 (90 Base) MCG/ACT inhaler Inhale 1-2 puffs into the lungs every 6 (six) hours as needed for wheezing or shortness of breath. (Patient not taking: Reported on 11/09/2022) 1 each 0   cyclobenzaprine (FLEXERIL) 10 MG tablet Take 1 tablet (10 mg total) by mouth 2 (two) times daily as needed for muscle spasms. (Patient not taking: Reported on 02/08/2023) 20 tablet 0   erythromycin ophthalmic ointment Place a 1/2 inch ribbon of ointment into the lower eyelid 4 times daily. (Patient not taking: Reported on  02/08/2023) 3.5 g 0   No facility-administered medications prior to visit.    Allergies  Allergen Reactions   Peanut-Containing Drug Products Anaphylaxis   Shrimp [Shellfish Allergy] Anaphylaxis    Makes throat itchy and dry   Watermelon Flavor Anaphylaxis   Lactase Other (See Comments)    Constipation, gi upset    ROS Review of Systems  Constitutional:  Negative for activity change, appetite change, chills, diaphoresis and fatigue.  Eyes: Negative.   Respiratory: Negative.  Negative for apnea, cough, choking and chest tightness.   Cardiovascular: Negative.  Negative for chest pain, palpitations and leg swelling.  Gastrointestinal: Negative.  Negative for abdominal distention, abdominal  pain, anal bleeding, blood in stool, constipation and diarrhea.  Genitourinary: Negative.  Negative for difficulty urinating, dyspareunia, dysuria, enuresis, flank pain, frequency and genital sores.  Musculoskeletal: Negative.  Negative for arthralgias, back pain, gait problem and joint swelling.  Skin: Negative.   Neurological:  Negative for dizziness, seizures, facial asymmetry, light-headedness, numbness and headaches.  Psychiatric/Behavioral: Negative.  Negative for agitation, behavioral problems, confusion, decreased concentration and dysphoric mood.       Objective:    Physical Exam Constitutional:      General: She is not in acute distress.    Appearance: Normal appearance. She is not ill-appearing, toxic-appearing or diaphoretic.  Eyes:     General: No scleral icterus.       Right eye: No discharge.        Left eye: No discharge.     Extraocular Movements: Extraocular movements intact.  Cardiovascular:     Rate and Rhythm: Normal rate and regular rhythm.     Pulses: Normal pulses.     Heart sounds: Normal heart sounds. No murmur heard.    No friction rub. No gallop.  Pulmonary:     Effort: Pulmonary effort is normal. No respiratory distress.     Breath sounds: Normal breath sounds. No stridor. No wheezing, rhonchi or rales.  Chest:     Chest wall: No tenderness.  Abdominal:     General: There is no distension.     Palpations: Abdomen is soft. There is no mass.     Tenderness: There is no abdominal tenderness. There is no right CVA tenderness, left CVA tenderness, guarding or rebound.     Hernia: No hernia is present.  Musculoskeletal:        General: No swelling, tenderness, deformity or signs of injury.     Right lower leg: No edema.     Left lower leg: No edema.  Skin:    General: Skin is warm and dry.     Capillary Refill: Capillary refill takes less than 2 seconds.     Coloration: Skin is not jaundiced or pale.  Neurological:     Mental Status: She is alert and  oriented to person, place, and time.     Cranial Nerves: No cranial nerve deficit.     Sensory: No sensory deficit.     Motor: No weakness.     Coordination: Coordination normal.     Gait: Gait normal.  Psychiatric:        Mood and Affect: Mood normal.        Behavior: Behavior normal.        Thought Content: Thought content normal.        Judgment: Judgment normal.     BP (!) 93/58   Pulse 67   Temp (!) 97.3 F (36.3 C)   Ht 5' 3"$  (1.6  m)   Wt 131 lb 6.4 oz (59.6 kg)   LMP 01/04/2023   SpO2 100%   BMI 23.28 kg/m  Wt Readings from Last 3 Encounters:  02/08/23 131 lb 6.4 oz (59.6 kg)  02/04/23 128 lb (58.1 kg)  11/09/22 133 lb 9.6 oz (60.6 kg)    Lab Results  Component Value Date   TSH 0.752 06/25/2019   Lab Results  Component Value Date   WBC 3.9 11/12/2022   HGB 14.8 11/12/2022   HCT 43.9 11/12/2022   MCV 94 11/12/2022   PLT 279 11/12/2022   Lab Results  Component Value Date   NA 140 11/12/2022   K 5.0 11/12/2022   CO2 21 11/12/2022   GLUCOSE 87 11/12/2022   BUN 4 (L) 11/12/2022   CREATININE 0.79 11/12/2022   BILITOT 0.2 11/12/2022   ALKPHOS 72 11/12/2022   AST 273 (H) 11/12/2022   ALT 49 (H) 11/12/2022   PROT 7.1 11/12/2022   ALBUMIN 4.5 11/12/2022   CALCIUM 9.1 11/12/2022   ANIONGAP 4 (L) 10/02/2021   EGFR 107 11/12/2022   No results found for: "CHOL" No results found for: "HDL" No results found for: "LDLCALC" No results found for: "TRIG" No results found for: "CHOLHDL" No results found for: "HGBA1C"    Assessment & Plan:   Problem List Items Addressed This Visit       Other   Right upper quadrant abdominal pain    Patient currently denies abdominal pain.       High risk heterosexual behavior    Had STD testing done few days ago results with normal She was encouraged to avoid high risk sexual behavior to prevent risk of getting STDs She was offered condoms in the office today      Elevated liver enzymes - Primary    Drinks  alcohol socially, does not use tylenol excessively    Last AST  was 273 ALT 49 She currently denies abdominal pain, nausea, vomiting Will recheck hepatic panel       Relevant Orders   Hepatic Function Panel    No orders of the defined types were placed in this encounter.   Follow-up: No follow-ups on file.    Renee Rival, FNP

## 2023-02-08 NOTE — Assessment & Plan Note (Addendum)
Drinks alcohol socially, does not use tylenol excessively    Last AST  was 273 ALT 49 She currently denies abdominal pain, nausea, vomiting Will recheck hepatic panel

## 2023-02-08 NOTE — Patient Instructions (Signed)
Please follow up with Dr Margarita Rana in July 2024 for your annual physical exam     It is important that you exercise regularly at least 30 minutes 5 times a week as tolerated  Think about what you will eat, plan ahead. Choose " clean, green, fresh or frozen" over canned, processed or packaged foods which are more sugary, salty and fatty. 70 to 75% of food eaten should be vegetables and fruit. Three meals at set times with snacks allowed between meals, but they must be fruit or vegetables. Aim to eat over a 12 hour period , example 7 am to 7 pm, and STOP after  your last meal of the day. Drink water,generally about 64 ounces per day, no other drink is as healthy. Fruit juice is best enjoyed in a healthy way, by EATING the fruit.  Thanks for choosing [facility] we consider it a privelige to serve you.

## 2023-02-26 ENCOUNTER — Other Ambulatory Visit: Payer: Self-pay

## 2023-02-26 ENCOUNTER — Ambulatory Visit
Admission: EM | Admit: 2023-02-26 | Discharge: 2023-02-26 | Disposition: A | Discharge status: Home / Self Care | Payer: Medicaid Other | Attending: Family Medicine | Admitting: Family Medicine

## 2023-02-26 ENCOUNTER — Encounter: Payer: Self-pay | Admitting: Emergency Medicine

## 2023-02-26 DIAGNOSIS — H00014 Hordeolum externum left upper eyelid: Secondary | ICD-10-CM

## 2023-02-26 MED ORDER — GENTAMICIN SULFATE 0.3 % OP SOLN: 2.0000 [drp] | Freq: Three times a day (TID) | OPHTHALMIC | 0 refills | Status: AC

## 2023-02-26 MED ORDER — CETIRIZINE HCL 10 MG PO TABS: 10.0000 mg | ORAL_TABLET | Freq: Every day | ORAL | 0 refills | Status: DC | PRN

## 2023-02-26 NOTE — ED Triage Notes (Signed)
Pt here for swelling to left upper eye lid

## 2023-02-26 NOTE — ED Provider Notes (Signed)
[name]    CSN: [id number] Arrival date & time: 02/26/23  1628      History   Chief Complaint Chief Complaint  Patient presents with   Facial Swelling    HPI Cassie Nguyen is a 27 y.o. female.   HPI Here for swelling of her left upper eyelid.  It is itching also.  She has been doing cool compresses and it is improved since she first noted it this morning.  No fever or cough or congestion.  She had a similar swelling of her right upper eyelid and was seen here on February 5.  It improved within 1 day.  She only use the erythromycin ointment 1 day.  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 Authorizing Provider  cetirizine (ZYRTEC ALLERGY) 10 MG tablet Take 1 tablet (10 mg total) by mouth daily as needed for allergies. 02/26/23  Yes Barrett Henle, MD  gentamicin (GARAMYCIN) 0.3 % ophthalmic solution Place 2 drops into the left eye 3 (three) times daily for 5 days. 02/26/23 03/03/23 Yes Travares Nelles, Gwenlyn Perking, MD  albuterol (VENTOLIN HFA) 108 (90 Base) MCG/ACT inhaler Inhale 1-2 puffs into the lungs every 6 (six)  hours as needed for wheezing or shortness of breath. 05/10/22   Vanessa Kick, MD  medroxyPROGESTERone (PROVERA) 10 MG tablet Take 1 tablet (10 mg total) by mouth daily. Patient not taking: Reported on 07/20/2019 06/25/19 02/26/20  Chase Picket, MD    Family History Family History  Problem Relation Age of Onset   Cancer Mother     Social History Social History   Tobacco Use   Smoking status: Never   Smokeless tobacco: Never  Vaping Use   Vaping Use: Never used  Substance Use Topics   Alcohol use: Yes    Comment: occasionally   Drug use: Yes    Types: Marijuana     Allergies   Peanut-containing drug products, Shrimp [shellfish allergy], Watermelon flavor, and Lactase   Review of Systems Review of Systems   Physical Exam Triage Vital Signs ED Triage Vitals [02/26/23 1756]  Enc Vitals Group     BP 114/72     Pulse Rate 78     Resp 18     Temp 98.2 F (36.8 C)     Temp Source Oral     SpO2  98 %     Weight      Height      Head Circumference      Peak Flow      Pain Score 0     Pain Loc      Pain Edu?      Excl. in [place]?    No data found.  Updated Vital Signs BP 114/72 (BP Location: Left Arm)   Pulse 78   Temp 98.2 F (36.8 C) (Oral)   Resp 18   LMP 01/04/2023   SpO2 98%   Visual Acuity Right Eye Distance:   Left Eye Distance:   Bilateral Distance:    Right Eye Near:   Left Eye Near:    Bilateral Near:     Physical Exam Vitals reviewed.  Constitutional:      General: She is not in acute distress.    Appearance: She is not ill-appearing, toxic-appearing or diaphoretic.  HENT:     Mouth/Throat:     Mouth: Mucous membranes are moist.  Eyes:     Extraocular Movements: Extraocular movements intact.     Conjunctiva/sclera: Conjunctivae normal.     Pupils: Pupils are equal, round, and reactive to light.     Comments: There is diffuse mild edema with mild erythema of the upper eyelid on the left eye.  The eyes not injected.  Cardiovascular:      Rate and Rhythm: Normal rate and regular rhythm.     Heart sounds: No murmur heard. Pulmonary:     Effort: Pulmonary effort is normal.     Breath sounds: Normal breath sounds.  Musculoskeletal:     Cervical back: Neck supple.  Lymphadenopathy:     Cervical: No cervical adenopathy.  Skin:    Coloration: Skin is not jaundiced or pale.  Neurological:     General: No focal deficit present.     Mental Status: She is alert and oriented to person, place, and time.  Psychiatric:        Behavior: Behavior normal.      UC Treatments / Results  Labs (all labs ordered are listed, but only abnormal results are displayed) Labs Reviewed - No data to display  EKG   Radiology No results found.  Procedures Procedures (including critical care time)  Medications Ordered in UC Medications - No data to display  Initial Impression / Assessment and Plan / UC Course  I have reviewed the triage vital signs and the nursing notes.  Pertinent labs & imaging results that were available during my care of the patient were reviewed by me and considered in my medical decision making (see chart for details).        Gentamicin is sent in for possible early hordeolum and she is going to do cool compresses also Zyrtec is sent in for possible allergy. Final Clinical Impressions(s) / UC Diagnoses   Final diagnoses:  Hordeolum externum of left upper eyelid     Discharge Instructions      Put gentamicin eyedrops in the affected eye(s) 3 times daily for 5 days.       ED Prescriptions     Medication Sig Dispense Auth. Provider   gentamicin (GARAMYCIN) 0.3 % ophthalmic solution Place 2 drops into the left eye 3 (three) times daily for 5 days. 5 mL Barrett Henle, MD   cetirizine (ZYRTEC ALLERGY) 10 MG tablet Take 1 tablet (10 mg total) by mouth daily as needed for allergies. 30 tablet Windy Carina, Gwenlyn Perking, MD  PDMP not reviewed this encounter.   Barrett Henle, MD 02/26/23  [phone]

## 2023-02-26 NOTE — Discharge Instructions (Signed)
Put gentamicin eyedrops in the affected eye(s) 3 times daily for 5 days.

## 2023-03-17 ENCOUNTER — Ambulatory Visit (HOSPITAL_COMMUNITY)
Admission: EM | Admit: 2023-03-17 | Discharge: 2023-03-17 | Disposition: A | Discharge status: Home / Self Care | Payer: Medicaid Other | Attending: Emergency Medicine | Admitting: Emergency Medicine

## 2023-03-17 ENCOUNTER — Encounter (HOSPITAL_COMMUNITY): Payer: Self-pay

## 2023-03-17 DIAGNOSIS — F1290 Cannabis use, unspecified, uncomplicated: Secondary | ICD-10-CM | POA: Diagnosis not present

## 2023-03-17 DIAGNOSIS — Z202 Contact with and (suspected) exposure to infections with a predominantly sexual mode of transmission: Secondary | ICD-10-CM

## 2023-03-17 DIAGNOSIS — H9201 Otalgia, right ear: Secondary | ICD-10-CM

## 2023-03-17 DIAGNOSIS — N926 Irregular menstruation, unspecified: Secondary | ICD-10-CM | POA: Diagnosis not present

## 2023-03-17 LAB — POCT URINALYSIS DIPSTICK, ED / UC
Bilirubin Urine: NEGATIVE
Glucose, UA: NEGATIVE mg/dL
Ketones, ur: NEGATIVE mg/dL
Nitrite: NEGATIVE
Protein, ur: NEGATIVE mg/dL
Specific Gravity, Urine: 1.02 (ref 1.005–1.030)
Urobilinogen, UA: 0.2 mg/dL (ref 0.0–1.0)
pH: 6 (ref 5.0–8.0)

## 2023-03-17 LAB — POC URINE PREG, ED: Preg Test, Ur: NEGATIVE

## 2023-03-17 NOTE — Discharge Instructions (Addendum)
Please follow up with PCP for recheck of irregular periods. Your pregnancy test was negative. May use over the counter debrox/cerumen removal kit for ear wax removal. Please check my chart for STD results, avoid sexual activity until results known, condoms with all future sexual activity.

## 2023-03-17 NOTE — ED Provider Notes (Signed)
[facility]    CSN: [id number] Arrival date & time: 03/17/23  1753      History   Chief Complaint Chief Complaint  Patient presents with   Otalgia   Exposure to STD    HPI Cassie Nguyen is a 27 y.o. female.   27 year old female pt, Cassie Nguyen, presents to ER with chief complaint of right ear pain, possible exposure to STD, irregular periods. Pt on cell phone, endorses marijuana use.  The history is provided by the patient. No language interpreter was used.    Past Medical History:  Diagnosis Date   Asthma    Chlamydia    Gonorrhea    Trichomonas infection     Patient Active Problem List   Diagnosis Date Noted   Irregular periods/menstrual cycles 03/17/2023   Acute otalgia, right 03/17/2023   Possible exposure to STD 03/17/2023   Marijuana use 03/17/2023   Elevated liver enzymes 02/08/2023   Right upper quadrant abdominal pain 11/09/2022   High risk heterosexual behavior 11/09/2022   Abnormal vaginal bleeding 11/09/2022   ASC-H Pap smear 05/02/2021   Trichimoniasis 05/02/2021   Cervical dysplasia 07/27/2019   Abnormal uterine bleeding (AUB) 07/20/2019   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 Authorizing Provider  albuterol (VENTOLIN HFA) 108 (90 Base) MCG/ACT inhaler Inhale 1-2 puffs into the lungs every 6 (six) hours as needed for wheezing or shortness of breath. 05/10/22   Vanessa Kick, MD  cetirizine (ZYRTEC ALLERGY) 10 MG tablet Take 1 tablet (10 mg total) by mouth daily as needed for allergies. 02/26/23   Barrett Henle, MD  medroxyPROGESTERone  (PROVERA) 10 MG tablet Take 1 tablet (10 mg total) by mouth daily. Patient not taking: Reported on 07/20/2019 06/25/19 02/26/20  Chase Picket, MD    Family History Family History  Problem Relation Age of Onset   Cancer Mother     Social History Social History   Tobacco Use   Smoking status: Never   Smokeless tobacco: Never  Vaping Use   Vaping Use: Never used  Substance Use Topics   Alcohol use: Yes    Comment: occasionally   Drug use: Yes    Types: Marijuana     Allergies   Peanut-containing drug products, Shrimp [shellfish allergy], Watermelon flavor, and Lactase   Review of Systems Review of Systems  Constitutional:  Negative for fever.  HENT:  Positive for ear pain. Negative for ear discharge.   Genitourinary:  Negative for dysuria, vaginal bleeding, vaginal discharge and vaginal pain.  All other systems reviewed and are negative.    Physical Exam Triage Vital Signs ED Triage Vitals [03/17/23 1804]  Enc Vitals Group     BP 117/61     Pulse Rate 82     Resp 18     Temp 98.1 F (36.7  C)     Temp Source Oral     SpO2 98 %     Weight      Height      Head Circumference      Peak Flow      Pain Score      Pain Loc      Pain Edu?      Excl. in [place]?    No data found.  Updated Vital Signs BP 117/61 (BP Location: Left Arm)   Pulse 82   Temp 98.1 F (36.7 C) (Oral)   Resp 18   SpO2 98%   Visual Acuity Right Eye Distance:   Left Eye Distance:   Bilateral Distance:    Right Eye Near:   Left Eye Near:    Bilateral Near:     Physical Exam Vitals and nursing note reviewed.  Constitutional:      General: She is not in acute distress.    Appearance: She is well-developed and well-groomed.  HENT:     Head: Normocephalic and atraumatic.  Eyes:     Conjunctiva/sclera: Conjunctivae normal.  Cardiovascular:     Rate and Rhythm: Normal rate and regular rhythm.     Pulses: Normal pulses.     Heart sounds: Normal heart sounds. No murmur  heard. Pulmonary:     Effort: Pulmonary effort is normal. No respiratory distress.     Breath sounds: Normal breath sounds and air entry.  Abdominal:     Palpations: Abdomen is soft.     Tenderness: There is no abdominal tenderness.  Musculoskeletal:        General: No swelling.     Cervical back: Neck supple.  Skin:    General: Skin is warm and dry.     Capillary Refill: Capillary refill takes less than 2 seconds.  Neurological:     General: No focal deficit present.     Mental Status: She is alert and oriented to person, place, and time.     GCS: GCS eye subscore is 4. GCS verbal subscore is 5. GCS motor subscore is 6.  Psychiatric:        Attention and Perception: Attention normal.        Mood and Affect: Mood normal.        Speech: Speech normal.        Behavior: Behavior normal. Behavior is cooperative.      UC Treatments / Results  Labs (all labs ordered are listed, but only abnormal results are displayed) Labs Reviewed  POCT URINALYSIS DIPSTICK, ED / UC - Abnormal; Notable for the following components:      Result Value   Hgb urine dipstick MODERATE (*)    Leukocytes,Ua TRACE (*)    All other components within normal limits  POC URINE PREG, ED  CERVICOVAGINAL ANCILLARY ONLY    EKG   Radiology No results found.  Procedures Procedures (including critical care time)  Medications Ordered in UC Medications - No data to display  Initial Impression / Assessment and Plan / UC Course  I have reviewed the triage vital signs and the nursing notes.  Pertinent labs & imaging results that were available during my care of the patient were reviewed by me and considered in my medical decision making (see chart for details).     Ddx: Irregular periods, right otalgia, STI expsoure Final Clinical Impressions(s) / UC Diagnoses   Final diagnoses:  Irregular periods/menstrual cycles  Acute otalgia, right  Possible exposure to STD  Marijuana use     Discharge  Instructions      Please follow up with PCP for recheck of irregular periods. Your pregnancy test was negative. May use over the counter debrox/cerumen removal kit for ear wax removal. Please check my chart for STD results, avoid sexual activity until results known, condoms with all future sexual activity.    ED Prescriptions   None    PDMP not reviewed this encounter.   Tori Milks, NP [phone]

## 2023-03-17 NOTE — ED Triage Notes (Signed)
Pt is here for right ear pain. Pt also requesting vaginal swab.

## 2023-03-18 LAB — CERVICOVAGINAL ANCILLARY ONLY
Bacterial Vaginitis (gardnerella): NEGATIVE
Candida Glabrata: NEGATIVE
Candida Vaginitis: POSITIVE — AB
Chlamydia: NEGATIVE
Comment: NEGATIVE
Comment: NEGATIVE
Comment: NEGATIVE
Comment: NEGATIVE
Comment: NEGATIVE
Comment: NORMAL
Neisseria Gonorrhea: NEGATIVE
Trichomonas: NEGATIVE

## 2023-03-19 ENCOUNTER — Telehealth (HOSPITAL_COMMUNITY): Payer: Self-pay | Admitting: Emergency Medicine

## 2023-03-19 MED ORDER — FLUCONAZOLE 150 MG PO TABS: 150.0000 mg | ORAL_TABLET | Freq: Once | ORAL | 0 refills | Status: AC

## 2023-04-01 ENCOUNTER — Telehealth: Payer: Self-pay | Admitting: Obstetrics and Gynecology

## 2023-04-01 NOTE — Telephone Encounter (Signed)
Want to talk with a nurse about her cycles she have a scheduled appointment

## 2023-04-02 NOTE — Telephone Encounter (Signed)
Called pt and unable to leave a voicemail as box is full.   Cassie Nguyen

## 2023-04-04 ENCOUNTER — Other Ambulatory Visit: Payer: Self-pay

## 2023-04-04 ENCOUNTER — Ambulatory Visit (HOSPITAL_COMMUNITY): Payer: Self-pay

## 2023-04-04 ENCOUNTER — Encounter (HOSPITAL_COMMUNITY): Payer: Self-pay

## 2023-04-04 ENCOUNTER — Ambulatory Visit (HOSPITAL_COMMUNITY)
Admission: RE | Admit: 2023-04-04 | Discharge: 2023-04-04 | Disposition: A | Discharge status: Home / Self Care | Payer: Medicaid Other | Source: Ambulatory Visit | Attending: Family Medicine | Admitting: Family Medicine

## 2023-04-04 VITALS — BP 120/79 | HR 68 | Temp 98.4°F | Resp 18

## 2023-04-04 DIAGNOSIS — N939 Abnormal uterine and vaginal bleeding, unspecified: Secondary | ICD-10-CM | POA: Insufficient documentation

## 2023-04-04 LAB — CBC
HCT: 31.6 % — ABNORMAL LOW (ref 36.0–46.0)
Hemoglobin: 11.2 g/dL — ABNORMAL LOW (ref 12.0–15.0)
MCH: 32.4 pg (ref 26.0–34.0)
MCHC: 35.4 g/dL (ref 30.0–36.0)
MCV: 91.3 fL (ref 80.0–100.0)
Platelets: 270 10*3/uL (ref 150–400)
RBC: 3.46 MIL/uL — ABNORMAL LOW (ref 3.87–5.11)
RDW: 12 % (ref 11.5–15.5)
WBC: 4.4 10*3/uL (ref 4.0–10.5)
nRBC: 0 % (ref 0.0–0.2)

## 2023-04-04 LAB — POC URINE PREG, ED: Preg Test, Ur: NEGATIVE

## 2023-04-04 MED ORDER — TRANEXAMIC ACID 650 MG PO TABS: 1300.0000 mg | ORAL_TABLET | Freq: Three times a day (TID) | ORAL | 0 refills | Status: AC

## 2023-04-04 NOTE — ED Notes (Signed)
Patient requesting changes to work note.  Will direct to dr Windy Carina

## 2023-04-04 NOTE — Discharge Instructions (Signed)
Staff will notify if there is anything positive on the swab  The pregnancy test was negative  We have drawn blood to check for anemia, and staff will notify you if that blood is abnormal  Take Cassie Nguyen. Amick acid 650 mg tablets--2 tablets 3 times daily for up to 5 days

## 2023-04-04 NOTE — ED Triage Notes (Addendum)
Concerns for menstrual cycles. Patient reports a menstrual cycle since 2/29.  Today has had abdominal cramping  Patient took motrin.

## 2023-04-04 NOTE — ED Provider Notes (Signed)
[place]    CSN: [id number] Arrival date & time: 04/04/23  1902      History   Chief Complaint Chief Complaint  Patient presents with   Appointment    19:00   Vaginal Bleeding    HPI Cassie Nguyen is a 27 y.o. female.    Vaginal Bleeding  Here for abnormal uterine bleeding. Her last menstrual cycle started on February 29.  It lasted about 5 days, stopped for about a week, and then started again.  She has therefore been bleeding about 3 weeks.  She is having pelvic cramping.  No fever or chills.  No dysuria.  No urinary frequency.   She has felt tired some, but no dizziness. Past Medical History:  Diagnosis Date   Asthma    Chlamydia    Gonorrhea    Trichomonas infection     Patient Active Problem List   Diagnosis Date Noted   Irregular periods/menstrual cycles 03/17/2023   Acute otalgia, right 03/17/2023   Possible exposure to STD 03/17/2023   Marijuana use 03/17/2023   Elevated liver enzymes 02/08/2023   Right upper quadrant abdominal pain 11/09/2022   High risk heterosexual behavior 11/09/2022   Abnormal vaginal bleeding 11/09/2022   ASC-H Pap smear 05/02/2021   Trichimoniasis 05/02/2021   Cervical dysplasia 07/27/2019   Abnormal uterine bleeding (AUB) 07/20/2019   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 Authorizing Provider  tranexamic acid (LYSTEDA) 650 MG TABS tablet Take 2 tablets (1,300 mg total) by mouth 3 (three) times daily for 5 days. 04/04/23 04/09/23 Yes Lundy Cozart, Gwenlyn Perking, MD  albuterol (VENTOLIN HFA) 108 (90 Base) MCG/ACT inhaler Inhale 1-2  puffs into the lungs every 6 (six) hours as needed for wheezing or shortness of breath. 05/10/22   Vanessa Kick, MD  cetirizine (ZYRTEC ALLERGY) 10 MG tablet Take 1 tablet (10 mg total) by mouth daily as needed for allergies. 02/26/23   Barrett Henle, MD  medroxyPROGESTERone (PROVERA) 10 MG tablet Take 1 tablet (10 mg total) by mouth daily. Patient not taking: Reported on 07/20/2019 06/25/19 02/26/20  Chase Picket, MD    Family History Family History  Problem Relation Age of Onset   Cancer Mother     Social History Social History   Tobacco Use   Smoking status: Never   Smokeless tobacco: Never  Vaping Use   Vaping Use: Never used  Substance Use Topics   Alcohol use: Yes    Comment: occasionally   Drug use: Yes    Types: Marijuana     Allergies   Peanut-containing drug products, Shrimp [shellfish allergy], Watermelon flavor, and Lactase   Review of Systems Review of Systems  Genitourinary:  Positive for vaginal bleeding.     Physical Exam Triage Vital Signs ED Triage Vitals  Enc Vitals Group  BP 04/04/23 1912 120/79     Pulse Rate 04/04/23 1912 68     Resp 04/04/23 1912 18     Temp 04/04/23 1912 98.4 F (36.9 C)     Temp Source 04/04/23 1912 Oral     SpO2 04/04/23 1912 98 %     Weight --      Height --      Head Circumference --      Peak Flow --      Pain Score 04/04/23 1910 10     Pain Loc --      Pain Edu? --      Excl. in [place]? --    No data found.  Updated Vital Signs BP 120/79 (BP Location: Right Arm)   Pulse 68   Temp 98.4 F (36.9 C) (Oral)   Resp 18   LMP 02/26/2023 (Approximate)   SpO2 98%   Visual Acuity Right Eye Distance:   Left Eye Distance:   Bilateral Distance:    Right Eye Near:   Left Eye Near:    Bilateral Near:     Physical Exam Vitals reviewed.  Constitutional:      General: She is not in acute distress.    Appearance: She is not ill-appearing, toxic-appearing or diaphoretic.  HENT:     Mouth/Throat:      Mouth: Mucous membranes are moist.  Eyes:     Extraocular Movements: Extraocular movements intact.     Pupils: Pupils are equal, round, and reactive to light.  Cardiovascular:     Rate and Rhythm: Normal rate and regular rhythm.     Heart sounds: No murmur heard. Pulmonary:     Effort: Pulmonary effort is normal.     Breath sounds: Normal breath sounds.  Abdominal:     General: There is no distension.     Palpations: Abdomen is soft.     Tenderness: There is no abdominal tenderness.  Skin:    Coloration: Skin is not jaundiced or pale.  Neurological:     General: No focal deficit present.     Mental Status: She is alert and oriented to person, place, and time.      UC Treatments / Results  Labs (all labs ordered are listed, but only abnormal results are displayed) Labs Reviewed  CBC  POC URINE PREG, ED  CERVICOVAGINAL ANCILLARY ONLY    EKG   Radiology No results found.  Procedures Procedures (including critical care time)  Medications Ordered in UC Medications - No data to display  Initial Impression / Assessment and Plan / UC Course  I have reviewed the triage vital signs and the nursing notes.  Pertinent labs & imaging results that were available during my care of the patient were reviewed by me and considered in my medical decision making (see chart for details).         Final Clinical Impressions(s) / UC Diagnoses   Final diagnoses:  Abnormal uterine bleeding (AUB)     Discharge Instructions      Staff will notify if there is anything positive on the swab  The pregnancy test was negative  We have drawn blood to check for anemia, and staff will notify you if that blood is abnormal  Take Jonn Shingles. Amick acid 650 mg tablets--2 tablets 3 times daily for up to 5 days     ED Prescriptions     Medication Sig Dispense Auth. Provider   tranexamic acid (LYSTEDA) 650 MG TABS tablet Take 2 tablets (  1,300 mg total) by mouth 3 (three) times daily for  5 days. 30 tablet Skye Rodarte, Gwenlyn Perking, MD      PDMP not reviewed this encounter.   Barrett Henle, MD 04/04/23 [phone]

## 2023-04-04 NOTE — ED Notes (Signed)
Work note changed by dr Windy Carina .  Patient read and was pleased with content of work note

## 2023-04-05 ENCOUNTER — Encounter: Payer: Self-pay | Admitting: Allergy

## 2023-04-05 LAB — CERVICOVAGINAL ANCILLARY ONLY
Bacterial Vaginitis (gardnerella): NEGATIVE
Candida Glabrata: NEGATIVE
Candida Vaginitis: NEGATIVE
Chlamydia: NEGATIVE
Comment: NEGATIVE
Comment: NEGATIVE
Comment: NEGATIVE
Comment: NEGATIVE
Comment: NEGATIVE
Comment: NORMAL
Neisseria Gonorrhea: NEGATIVE
Trichomonas: NEGATIVE

## 2023-04-08 ENCOUNTER — Telehealth (HOSPITAL_COMMUNITY): Payer: Self-pay | Admitting: Emergency Medicine

## 2023-04-08 NOTE — Telephone Encounter (Signed)
Received message from front desk that patient was hoping for return call to review blood work Attempted to reach patient x 1, unable to LVM

## 2023-04-09 ENCOUNTER — Encounter (HOSPITAL_COMMUNITY): Payer: Self-pay | Admitting: *Deleted

## 2023-04-09 ENCOUNTER — Ambulatory Visit (HOSPITAL_COMMUNITY)
Admission: EM | Admit: 2023-04-09 | Discharge: 2023-04-09 | Disposition: A | Discharge status: Home / Self Care | Payer: Medicaid Other | Attending: Emergency Medicine | Admitting: Emergency Medicine

## 2023-04-09 DIAGNOSIS — J069 Acute upper respiratory infection, unspecified: Secondary | ICD-10-CM | POA: Diagnosis not present

## 2023-04-09 LAB — POCT RAPID STREP A, ED / UC: Streptococcus, Group A Screen (Direct): NEGATIVE

## 2023-04-09 MED ORDER — IBUPROFEN 800 MG PO TABS: 800.0000 mg | ORAL_TABLET | Freq: Three times a day (TID) | ORAL | 0 refills | Status: DC

## 2023-04-09 MED ORDER — NYSTATIN 100000 UNIT/ML MT SUSP: 5.0000 mL | Freq: Four times a day (QID) | OROMUCOSAL | 0 refills | Status: DC | PRN

## 2023-04-09 NOTE — ED Triage Notes (Signed)
Pt states she has sore throat, congestion, headache x 3 days. She states she took some nyquil

## 2023-04-09 NOTE — Discharge Instructions (Addendum)
Your symptoms today are most likely being caused by a virus and should steadily improve in time it can take up to 7 to 10 days before you truly start to see a turnaround however things will get better  Gargle and spit Magic mouthwash solution every 4 hours as needed to provide temporary relief to your throat    You can take Tylenol and/or Ibuprofen as needed for fever reduction and pain relief.   For cough: honey 1/2 to 1 teaspoon (you can dilute the honey in water or another fluid).  You can also use guaifenesin and dextromethorphan for cough. You can use a humidifier for chest congestion and cough.  If you don't have a humidifier, you can sit in the bathroom with the hot shower running.      For sore throat: try warm salt water gargles, cepacol lozenges, throat spray, warm tea or water with lemon/honey, popsicles or ice, or OTC cold relief medicine for throat discomfort.   For congestion: take a daily anti-histamine like Zyrtec, Claritin, and a oral decongestant, such as pseudoephedrine.  You can also use Flonase 1-2 sprays in each nostril daily.   It is important to stay hydrated: drink plenty of fluids (water, gatorade/powerade/pedialyte, juices, or teas) to keep your throat moisturized and help further relieve irritation/discomfort.

## 2023-04-09 NOTE — ED Provider Notes (Signed)
[facility]    CSN: [id number] Arrival date & time: 04/09/23  1929      History   Chief Complaint Chief Complaint  Patient presents with   Nasal Congestion   Sore Throat    HPI Cassie Nguyen is a 27 y.o. female.   Presents for evaluation of subjective fever, sore throat, body aches, nasal congestion, rhinorrhea, sore throat and productive cough present for 3 days.  No known sick contacts but endorses she went to 2 large gatherings over the weekend.  Decreased appetite but tolerating fluids.  Has attempted use of NyQuil which has been minimally effective.  History of asthma but denies shortness of breath and wheezing.  Past Medical History:  Diagnosis Date   Asthma    Chlamydia    Gonorrhea    Trichomonas infection     Patient Active Problem List   Diagnosis Date Noted   Irregular periods/menstrual cycles 03/17/2023   Acute otalgia, right 03/17/2023   Possible exposure to STD 03/17/2023   Marijuana use 03/17/2023   Elevated liver enzymes 02/08/2023   Right upper quadrant abdominal pain 11/09/2022   High risk heterosexual behavior 11/09/2022   Abnormal vaginal bleeding 11/09/2022   ASC-H Pap smear 05/02/2021   Trichimoniasis 05/02/2021   Cervical dysplasia 07/27/2019   Abnormal uterine bleeding (AUB) 07/20/2019   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 Authorizing Provider  albuterol (VENTOLIN HFA) 108 (90 Base) MCG/ACT inhaler Inhale 1-2 puffs into the lungs every 6 (six) hours as needed for wheezing or shortness of breath. 05/10/22   Mardella Layman, MD  cetirizine  (ZYRTEC ALLERGY) 10 MG tablet Take 1 tablet (10 mg total) by mouth daily as needed for allergies. 02/26/23   Zenia Resides, MD  tranexamic acid (LYSTEDA) 650 MG TABS tablet Take 2 tablets (1,300 mg total) by mouth 3 (three) times daily for 5 days. 04/04/23 04/09/23  Zenia Resides, MD  medroxyPROGESTERone (PROVERA) 10 MG tablet Take 1 tablet (10 mg total) by mouth daily. Patient not taking: Reported on 07/20/2019 06/25/19 02/26/20  Merrilee Jansky, MD    Family History Family History  Problem Relation Age of Onset   Cancer Mother     Social History Social History   Tobacco Use   Smoking status: Never   Smokeless tobacco: Never  Vaping Use   Vaping Use: Never used  Substance Use Topics   Alcohol use: Yes    Comment: occasionally   Drug use: Yes    Types: Marijuana     Allergies   Peanut-containing drug products, Shrimp [shellfish allergy], Watermelon flavor, and Lactase   Review of Systems Review of Systems  Constitutional:  Positive for chills and fever. Negative for activity change, appetite change, diaphoresis, fatigue and unexpected weight change.  HENT:  Positive for congestion, rhinorrhea and sore throat. Negative for  dental problem, drooling, ear discharge, ear pain, facial swelling, hearing loss, mouth sores, nosebleeds, postnasal drip, sinus pressure, sinus pain, sneezing, tinnitus, trouble swallowing and voice change.   Respiratory:  Positive for cough. Negative for apnea, choking, chest tightness, shortness of breath, wheezing and stridor.   Cardiovascular: Negative.   Gastrointestinal: Negative.   Musculoskeletal:  Positive for myalgias. Negative for arthralgias, back pain, gait problem, joint swelling, neck pain and neck stiffness.     Physical Exam Triage Vital Signs ED Triage Vitals  Enc Vitals Group     BP 04/09/23 2009 115/78     Pulse Rate 04/09/23 2009 (!) 106     Resp 04/09/23 2009 18     Temp 04/09/23 2009 99.8 F (37.7 C)     Temp Source  04/09/23 2009 Oral     SpO2 04/09/23 2009 98 %     Weight --      Height --      Head Circumference --      Peak Flow --      Pain Score 04/09/23 2008 8     Pain Loc --      Pain Edu? --      Excl. in [place]? --    No data found.  Updated Vital Signs BP 115/78 (BP Location: Right Arm)   Pulse (!) 106   Temp 99.8 F (37.7 C) (Oral)   Resp 18   LMP 02/26/2023 (Approximate)   SpO2 98%   Visual Acuity Right Eye Distance:   Left Eye Distance:   Bilateral Distance:    Right Eye Near:   Left Eye Near:    Bilateral Near:     Physical Exam Constitutional:      Appearance: She is well-developed.  HENT:     Right Ear: Tympanic membrane and ear canal normal.     Left Ear: Tympanic membrane and ear canal normal.     Nose: Congestion present. No rhinorrhea.     Mouth/Throat:     Mouth: Mucous membranes are moist.     Pharynx: Posterior oropharyngeal erythema present.     Tonsils: No tonsillar exudate. 0 on the right. 0 on the left.  Cardiovascular:     Rate and Rhythm: Normal rate and regular rhythm.     Heart sounds: Normal heart sounds.  Pulmonary:     Effort: Pulmonary effort is normal.     Breath sounds: Normal breath sounds.  Abdominal:     General: Bowel sounds are normal.     Palpations: Abdomen is soft.  Musculoskeletal:     Cervical back: Normal range of motion.  Lymphadenopathy:     Cervical: Cervical adenopathy present.  Skin:    General: Skin is warm and dry.  Neurological:     General: No focal deficit present.     Mental Status: She is alert and oriented to person, place, and time.      UC Treatments / Results  Labs (all labs ordered are listed, but only abnormal results are displayed) Labs Reviewed  POCT RAPID STREP A, ED / UC    EKG   Radiology No results found.  Procedures Procedures (including critical care time)  Medications Ordered in UC Medications - No data to display  Initial Impression / Assessment and Plan / UC Course  I have  reviewed the triage vital signs and the nursing notes.  Pertinent labs & imaging results that were available during my care of the patient were reviewed by me and considered  in my medical decision making (see chart for details).  Viral URI with cough  Patient is in no signs of distress nor toxic appearing.  Vital signs are stable.  Low suspicion for pneumonia, pneumothorax or bronchitis and therefore will defer imaging./Rapid strep test negative.  Prescribed Magic mouthwash and ibuprofen 800 mg of sore throat is most worrisome symptom.May use additional over-the-counter medications as needed for supportive care.  May follow-up with urgent care as needed if symptoms persist or worsen.    Final Clinical Impressions(s) / UC Diagnoses   Final diagnoses:  Viral URI with cough     Discharge Instructions      Your symptoms today are most likely being caused by a virus and should steadily improve in time it can take up to 7 to 10 days before you truly start to see a turnaround however things will get better    You can take Tylenol and/or Ibuprofen as needed for fever reduction and pain relief.   For cough: honey 1/2 to 1 teaspoon (you can dilute the honey in water or another fluid).  You can also use guaifenesin and dextromethorphan for cough. You can use a humidifier for chest congestion and cough.  If you don't have a humidifier, you can sit in the bathroom with the hot shower running.      For sore throat: try warm salt water gargles, cepacol lozenges, throat spray, warm tea or water with lemon/honey, popsicles or ice, or OTC cold relief medicine for throat discomfort.   For congestion: take a daily anti-histamine like Zyrtec, Claritin, and a oral decongestant, such as pseudoephedrine.  You can also use Flonase 1-2 sprays in each nostril daily.   It is important to stay hydrated: drink plenty of fluids (water, gatorade/powerade/pedialyte, juices, or teas) to keep your throat moisturized and  help further relieve irritation/discomfort.    ED Prescriptions   None    PDMP not reviewed this encounter.   Valinda Hoar, [place] 04/09/23 2037

## 2023-04-11 NOTE — Progress Notes (Deleted)
New Patient Note  RE: Cassie Nguyen MRN: [id number] DOB: 1996/10/15 Date of Office Visit: 04/12/2023  Consult requested by: Donell Beers, FNP Primary care provider: Donell Beers, FNP  Chief Complaint: No chief complaint on file.  History of Present Illness: I had the pleasure of seeing Cassie Nguyen for initial evaluation at the [facility] on 04/11/2023. She is a 27 y.o. female, who is referred here by Donell Beers, FNP for the evaluation of ***.  ***  Assessment and Plan: Cassie Nguyen is a 27 y.o. female with: No problem-specific Assessment & Plan notes found for this encounter.  No follow-ups on file.  No orders of the defined types were placed in this encounter.  Lab Orders  No laboratory test(s) ordered today    Other allergy screening: Asthma: {Blank single:19197::"yes","no"} Rhino conjunctivitis: {Blank single:19197::"yes","no"} Food allergy: {Blank single:19197::"yes","no"} Medication allergy: {Blank single:19197::"yes","no"} Hymenoptera allergy: {Blank single:19197::"yes","no"} Urticaria: {Blank single:19197::"yes","no"} Eczema:{Blank single:19197::"yes","no"} History of recurrent infections suggestive of immunodeficency: {Blank single:19197::"yes","no"}  Diagnostics: Spirometry:  Tracings reviewed. Her effort: {Blank single:19197::"Good reproducible efforts.","It was hard to get consistent efforts and there is a question as to whether this reflects a maximal maneuver.","Poor effort, data can not be interpreted."} FVC: ***L FEV1: ***L, ***% predicted FEV1/FVC ratio: ***% Interpretation: {Blank single:19197::"Spirometry consistent with mild obstructive disease","Spirometry consistent with moderate obstructive disease","Spirometry consistent with severe obstructive disease","Spirometry consistent with possible restrictive disease","Spirometry consistent with mixed obstructive and restrictive disease","Spirometry  uninterpretable due to technique","Spirometry consistent with normal pattern","No overt abnormalities noted given today's efforts"}.  Please see scanned spirometry results for details.  Skin Testing: {Blank single:19197::"Select foods","Environmental allergy panel","Environmental allergy panel and select foods","Food allergy panel","None","Deferred due to recent antihistamines use"}. *** Results discussed with patient/family.   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 1 each 1   cetirizine (ZYRTEC ALLERGY) 10 MG tablet Take 1 tablet (10 mg total) by mouth daily as needed for allergies. 30 tablet 0   ibuprofen (ADVIL) 800 MG tablet Take 1 tablet (800 mg total) by mouth 3 (three) times daily. 21 tablet 0   magic mouthwash (nystatin, lidocaine, diphenhydrAMINE, alum & mag hydroxide) suspension Swish and spit 5 mLs 4 (four) times daily as needed for mouth pain. 180 mL 0   No current facility-administered  medications for this visit.   Allergies: Allergies  Allergen Reactions   Peanut-Containing Drug Products Anaphylaxis   Shrimp [Shellfish Allergy] Anaphylaxis    Makes throat itchy and dry   Watermelon Flavor  Anaphylaxis   Lactase Other (See Comments)    Constipation, gi upset   Social History: Social History   Socioeconomic History   Marital status: Single    Spouse name: Not on file   Number of children: Not on file   Years of education: Not on file   Highest education level: Not on file  Occupational History   Not on file  Tobacco Use   Smoking status: Never   Smokeless tobacco: Never  Vaping Use   Vaping Use: Never used  Substance and Sexual Activity   Alcohol use: Yes    Comment: occasionally   Drug use: Yes    Types: Marijuana   Sexual activity: Yes    Birth control/protection: Condom  Other Topics Concern   Not on file  Social History Narrative   Not on file   Social Determinants of Health   Financial Resource Strain: Not on file  Food Insecurity: No Food Insecurity (05/02/2021)   Hunger Vital Sign    Worried About Running Out of Food in the Last Year: Never true    Ran Out of Food in the Last Year: Never true  Transportation Needs: No Transportation Needs (05/02/2021)   PRAPARE - Administrator, Civil Service (Medical): No    Lack of Transportation (Non-Medical): No  Physical Activity: Not on file  Stress: Not on file  Social Connections: Not on file   Lives in a ***. Smoking: *** Occupation: ***  Environmental HistorySurveyor, minerals in the house: Copywriter, advertising in the family room: {Blank single:19197::"yes","no"} Carpet in the bedroom: {Blank single:19197::"yes","no"} Heating: {Blank single:19197::"electric","gas","heat pump"} Cooling: {Blank single:19197::"central","window","heat pump"} Pet: {Blank single:19197::"yes ***","no"}  Family History: Family History  Problem Relation Age of Onset   Cancer  Mother    Problem                               Relation Asthma                                   *** Eczema                                *** Food allergy                          *** Allergic rhino conjunctivitis     ***  Review of Systems  Constitutional:  Negative for appetite change, chills, fever and unexpected weight change.  HENT:  Negative for congestion and rhinorrhea.   Eyes:  Negative for itching.  Respiratory:  Negative for cough, chest tightness, shortness of breath and wheezing.   Cardiovascular:  Negative for chest pain.  Gastrointestinal:  Negative for abdominal pain.  Genitourinary:  Negative for difficulty urinating.  Skin:  Negative for rash.  Neurological:  Negative for headaches.    Objective: LMP 02/26/2023 (Approximate)  There is no height or weight on file to calculate BMI. Physical Exam Vitals and nursing note reviewed.  Constitutional:      Appearance: Normal appearance. She is well-developed.  HENT:     Head: Normocephalic and atraumatic.     Right Ear: Tympanic membrane and external ear normal.     Left Ear: Tympanic membrane and external ear normal.  Nose: Nose normal.     Mouth/Throat:     Mouth: Mucous membranes are moist.     Pharynx: Oropharynx is clear.  Eyes:     Conjunctiva/sclera: Conjunctivae normal.  Cardiovascular:     Rate and Rhythm: Normal rate and regular rhythm.     Heart sounds: Normal heart sounds. No murmur heard.    No friction rub. No gallop.  Pulmonary:     Effort: Pulmonary effort is normal.     Breath sounds: Normal breath sounds. No wheezing, rhonchi or rales.  Musculoskeletal:     Cervical back: Neck supple.  Skin:    General: Skin is warm.     Findings: No rash.  Neurological:     Mental Status: She is alert and oriented to person, place, and time.  Psychiatric:        Behavior: Behavior normal.    The plan was reviewed with the patient/family, and all questions/concerned were addressed.  It was my  pleasure to see Wardell HeathBritany today and participate in her care. Please feel free to contact me with any questions or concerns.  Sincerely,  Wyline MoodYoon Tahj Lindseth, DO Allergy & Immunology  [facility] [place] office: [phone] [facility] office: [phone]

## 2023-04-12 ENCOUNTER — Ambulatory Visit: Payer: Medicaid Other | Admitting: Allergy

## 2023-05-20 ENCOUNTER — Ambulatory Visit: Payer: Medicaid Other | Admitting: Obstetrics and Gynecology

## 2023-05-21 NOTE — Progress Notes (Signed)
   GYNECOLOGY OFFICE VISIT NOTE  History:   Quaniya Calzadillas is a 27 y.o. G1P0000 here today for abnormal cycles. She states this is an ongoing problem since March. She had heavy menstrual bleeding 3/1-3/4, 3/17-3/31, 4/1-4/4, 4/17-4/22. It is distressing to her because she does not know when it will start and it is heavy. She has not yet had a period for the month of may. She denies any abnormal vaginal discharge, current bleeding, pelvic pain or other concerns. She denies being sexually active.     Past Medical History:  Diagnosis Date   Asthma    Chlamydia    Gonorrhea    Trichomonas infection     Past Surgical History:  Procedure Laterality Date   ROOT CANAL      The following portions of the patient's history were reviewed and updated as appropriate: allergies, current medications, past family history, past medical history, past social history, past surgical history and problem list.   Health Maintenance:  Normal pap on 07/11/2022  Review of Systems:  Pertinent items noted in HPI and remainder of comprehensive ROS otherwise negative.  Physical Exam:  BP 97/74   Pulse 91   Ht 5\' 3"  (1.6 m)   Wt 126 lb 12.8 oz (57.5 kg)   BMI 22.46 kg/m  General: Appears well, no acute distress. Age appropriate. Cardiac: regular rate noted Respiratory: normal effort Extremities: No edema or cyanosis. Skin: Warm and dry, no rashes noted Neuro: alert and oriented, no focal deficits Psych: normal affect Pelvic: Deferred  Labs and Imaging No results found for this or any previous visit (from the past 168 hour(s)). No results found.    Assessment and Plan:    1. Abnormal vaginal bleeding X2 months. Negative UPT. Hx of STDs. Family hx of diabetes. Does not desire birth control to help regulate cycles at this time. Discussed choices for contraception and given in AVS for further reading and consideration. Follow up encouraged pending lab results and patients desire for treatment.  -  CBC - TSH - HgB A1c - HIV antibody (with reflex) - RPR - Cervicovaginal ancillary only( [facility])  Routine preventative health maintenance measures emphasized. Please refer to After Visit Summary for other counseling recommendations.   No follow-ups on file.    Lavonda Jumbo, DO OB Fellow, Faculty [facility], Center for [facility] 05/23/2023, 2:54 PM

## 2023-05-23 ENCOUNTER — Ambulatory Visit (INDEPENDENT_AMBULATORY_CARE_PROVIDER_SITE_OTHER): Payer: Medicaid Other | Admitting: Family Medicine

## 2023-05-23 ENCOUNTER — Other Ambulatory Visit (HOSPITAL_COMMUNITY)
Admission: RE | Admit: 2023-05-23 | Discharge: 2023-05-23 | Disposition: A | Discharge status: Home / Self Care | Payer: Medicaid Other | Source: Ambulatory Visit | Attending: Obstetrics and Gynecology | Admitting: Obstetrics and Gynecology

## 2023-05-23 ENCOUNTER — Other Ambulatory Visit: Payer: Self-pay

## 2023-05-23 VITALS — BP 97/74 | HR 91 | Ht 63.0 in | Wt 126.8 lb

## 2023-05-23 DIAGNOSIS — N939 Abnormal uterine and vaginal bleeding, unspecified: Secondary | ICD-10-CM | POA: Insufficient documentation

## 2023-05-23 LAB — POCT PREGNANCY, URINE: Preg Test, Ur: NEGATIVE

## 2023-05-23 NOTE — Patient Instructions (Signed)
We will contact you when your results are back.   Contraception Choices Contraception refers to things you do or use to prevent pregnancy. It is also called birth control. There are several methods of birth control. Talk to your doctor about the best method for you. Hormonal birth control This kind of birth control uses hormones. Here are some types of hormonal birth control: A tube that is put under the skin of your arm (implant). The tube can stay in for up to 3 years. Shots you get every 3 months. Pills you take every day. A patch you change 1 time each week for 3 weeks. After that, the patch is taken off for 1 week. A ring you put in the vagina. The ring is left in for 3 weeks. Then it is taken out of the vagina for 1 week. Then a new ring is put in. Pills you take after unprotected sex. These are called emergency birth control pills. Barrier birth control Here are some types of barrier birth control: A thin covering that is put on the penis before sex (female condom). The covering is thrown away after sex. A soft, loose covering that is put in the vagina before sex (female condom). The covering is thrown away after sex. A rubber bowl that sits over the cervix (diaphragm). The bowl must be made for you. The bowl is put into the vagina before sex. The bowl is left in for 6-8 hours after sex. It is taken out within 24 hours. A small, soft cup that fits over the cervix (cervical cap). The cup must be made for you. The cup should be left in for 6-8 hours after sex. It is taken out within 48 hours. A sponge that is put into the vagina before sex. It must be left in for at least 6 hours after sex. It must be taken out within 30 hours and thrown away. A chemical that kills or stops sperm from getting into the womb (uterus). This chemical is called a spermicide. It may be a pill, cream, jelly, or foam to put in the vagina. The chemical should be used at least 10-15 minutes before sex. IUD birth  control IUD means "intrauterine device." It is put inside the womb. There are two kinds: Hormone IUD. This kind can stay in the womb for 3-5 years. Copper IUD. This kind can stay in the womb for 10 years. Permanent birth control Here are some types of permanent birth control: Surgery to block the fallopian tubes. Having an insert put into each fallopian tube. This method takes 3 months to work. Other forms of birth control must be used for 3 months. Surgery to tie off the tubes that carry sperm in men (vasectomy). This method takes 3 months to work. Other forms of birth control must be used for 3 months. Natural planning birth control Here are some types of natural planning birth control: Not having sex on the days the woman could get pregnant. Using a calendar: To keep track of the length of each menstrual cycle. To find out what days pregnancy can happen. To plan to not have sex on days when pregnancy can happen. Watching for signs of ovulation and not having sex during this time. One way the woman can check for ovulation is to check her temperature. Waiting to have sex until after ovulation. Where to find more information Centers for Disease Control and Prevention: [employer] Summary Contraception, also called birth control, refers to things you do or  use to prevent pregnancy. Hormonal methods of birth control include implants, injections, pills, patches, vaginal rings, and emergency birth control pills. Barrier methods of birth control can include female condoms, female condoms, diaphragms, cervical caps, sponges, and spermicides. There are two types of IUD (intrauterine device) birth control. An IUD can be put in a woman's womb to prevent pregnancy for several years. Permanent birth control can be done through a procedure for males, females, or both. Natural planning means not having sex when the woman could get pregnant. This information is not intended to replace advice given to you  by your health care provider. Make sure you discuss any questions you have with your health care provider. Document Revised: 05/23/2020 Document Reviewed: 05/23/2020 Elsevier Patient Education  2024 [employer].

## 2023-05-24 LAB — CBC
Hematocrit: 38.8 % (ref 34.0–46.6)
Hemoglobin: 13.1 g/dL (ref 11.1–15.9)
MCH: 29.7 pg (ref 26.6–33.0)
MCHC: 33.8 g/dL (ref 31.5–35.7)
MCV: 88 fL (ref 79–97)
Platelets: 355 10*3/uL (ref 150–450)
RBC: 4.41 x10E6/uL (ref 3.77–5.28)
RDW: 12.6 % (ref 11.7–15.4)
WBC: 3.4 10*3/uL (ref 3.4–10.8)

## 2023-05-24 LAB — CERVICOVAGINAL ANCILLARY ONLY
Bacterial Vaginitis (gardnerella): NEGATIVE
Candida Glabrata: NEGATIVE
Candida Vaginitis: POSITIVE — AB
Chlamydia: NEGATIVE
Comment: NEGATIVE
Comment: NEGATIVE
Comment: NEGATIVE
Comment: NEGATIVE
Comment: NEGATIVE
Comment: NORMAL
Neisseria Gonorrhea: NEGATIVE
Trichomonas: NEGATIVE

## 2023-05-24 LAB — HEMOGLOBIN A1C
Est. average glucose Bld gHb Est-mCnc: 91 mg/dL
Hgb A1c MFr Bld: 4.8 % (ref 4.8–5.6)

## 2023-05-24 LAB — TSH: TSH: 1.47 u[IU]/mL (ref 0.450–4.500)

## 2023-05-24 LAB — HIV ANTIBODY (ROUTINE TESTING W REFLEX): HIV Screen 4th Generation wRfx: NONREACTIVE

## 2023-05-24 LAB — RPR: RPR Ser Ql: NONREACTIVE

## 2023-05-28 ENCOUNTER — Other Ambulatory Visit: Payer: Self-pay | Admitting: Family Medicine

## 2023-05-28 DIAGNOSIS — B379 Candidiasis, unspecified: Secondary | ICD-10-CM

## 2023-05-28 MED ORDER — FLUCONAZOLE 150 MG PO TABS
150.0000 mg | ORAL_TABLET | Freq: Every day | ORAL | 1 refills | Status: DC
Start: 2023-05-28 — End: 2023-06-26

## 2023-06-26 ENCOUNTER — Ambulatory Visit (HOSPITAL_COMMUNITY)
Admission: RE | Admit: 2023-06-26 | Discharge: 2023-06-26 | Disposition: A | Discharge status: Home / Self Care | Payer: Medicaid Other | Source: Ambulatory Visit | Attending: Internal Medicine | Admitting: Internal Medicine

## 2023-06-26 ENCOUNTER — Encounter (HOSPITAL_COMMUNITY): Payer: Self-pay

## 2023-06-26 VITALS — BP 107/75 | HR 79 | Temp 98.3°F | Resp 16 | Ht 63.0 in | Wt 128.0 lb

## 2023-06-26 DIAGNOSIS — Z202 Contact with and (suspected) exposure to infections with a predominantly sexual mode of transmission: Secondary | ICD-10-CM | POA: Diagnosis not present

## 2023-06-26 DIAGNOSIS — Z113 Encounter for screening for infections with a predominantly sexual mode of transmission: Secondary | ICD-10-CM | POA: Insufficient documentation

## 2023-06-26 DIAGNOSIS — Z7251 High risk heterosexual behavior: Secondary | ICD-10-CM | POA: Insufficient documentation

## 2023-06-26 LAB — POCT URINE PREGNANCY: Preg Test, Ur: NEGATIVE

## 2023-06-26 NOTE — ED Provider Notes (Signed)
[facility]    CSN: [id number] Arrival date & time: 06/26/23  1600      History   Chief Complaint Chief Complaint  Patient presents with   STD screening    HPI Cassie Nguyen is a 27 y.o. female who is requesting STD testing due to having sex with her ex 2 weeks ago. She denies any symptoms. He did not wear a condom, and she declined birth control from GYN last month. Had negative STD test last month.     Past Medical History:  Diagnosis Date   Asthma    Chlamydia    Gonorrhea    Trichomonas infection     Patient Active Problem List   Diagnosis Date Noted   Irregular periods/menstrual cycles 03/17/2023   Acute otalgia, right 03/17/2023   Possible exposure to STD 03/17/2023   Marijuana use 03/17/2023   Elevated liver enzymes 02/08/2023   Right upper quadrant abdominal pain 11/09/2022   High risk heterosexual behavior 11/09/2022   Abnormal vaginal bleeding 11/09/2022   ASC-H Pap smear 05/02/2021   Trichimoniasis 05/02/2021   Cervical dysplasia 07/27/2019   Abnormal uterine bleeding (AUB) 07/20/2019   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 Authorizing Provider  albuterol (VENTOLIN HFA) 108 (90 Base) MCG/ACT inhaler Inhale 1-2 puffs into the lungs every 6 (six) hours as needed for wheezing or shortness of breath. 05/10/22  Yes Mardella Layman, MD  ibuprofen (ADVIL) 800 MG tablet Take 1 tablet (800 mg total) by mouth 3 (three) times daily. 04/09/23  Yes White, Elita Boone, NP  medroxyPROGESTERone (PROVERA) 10 MG tablet Take 1 tablet (10 mg total) by mouth daily. Patient not  taking: Reported on 07/20/2019 06/25/19 02/26/20  Merrilee Jansky, MD    Family History Family History  Problem Relation Age of Onset   Cancer Mother     Social History Social History   Tobacco Use   Smoking status: Never   Smokeless tobacco: Never  Vaping Use   Vaping Use: Never used  Substance Use Topics   Alcohol use: Yes    Comment: occasionally   Drug use: Not Currently    Types: Marijuana     Allergies   Peanut-containing drug products, Shrimp [shellfish allergy], Watermelon flavor, and Lactase   Review of Systems Review of Systems As noted in HPI  Physical Exam Triage Vital Signs ED Triage Vitals  Enc Vitals Group     BP 06/26/23 1625 107/75     Pulse Rate 06/26/23 1625 79     Resp 06/26/23 1625 16     Temp 06/26/23 1625 98.3 F (36.8 C)     Temp Source 06/26/23 1625 Oral     SpO2 06/26/23 1625 96 %     Weight 06/26/23 1623 128 lb (58.1 kg)     Height 06/26/23 1623 5\' 3"  (1.6  m)     Head Circumference --      Peak Flow --      Pain Score 06/26/23 1623 0     Pain Loc --      Pain Edu? --      Excl. in GC? --    No data found.  Updated Vital Signs BP 107/75 (BP Location: Right Arm)   Pulse 79   Temp 98.3 F (36.8 C) (Oral)   Resp 16   Ht 5\' 3"  (1.6 m)   Wt 128 lb (58.1 kg)   LMP 05/29/2023 (Exact Date)   SpO2 96%   BMI 22.67 kg/m   Visual Acuity Right Eye Distance:   Left Eye Distance:   Bilateral Distance:    Right Eye Near:   Left Eye Near:    Bilateral Near:     Physical Exam Vitals reviewed.  Constitutional:      General: She is not in acute distress.    Appearance: Normal appearance.  HENT:     Right Ear: External ear normal.     Left Ear: External ear normal.  Eyes:     General: No scleral icterus.    Conjunctiva/sclera: Conjunctivae normal.  Pulmonary:     Effort: Pulmonary effort is normal.  Musculoskeletal:        General: Normal range of motion.     Cervical back: Neck supple.  Neurological:     Mental Status:  She is alert and oriented to person, place, and time.     Gait: Gait normal.  Psychiatric:        Mood and Affect: Mood normal.        Behavior: Behavior normal.        Thought Content: Thought content normal.        Judgment: Judgment normal.      UC Treatments / Results  Labs (all labs ordered are listed, but only abnormal results are displayed) Labs Reviewed  POCT URINE PREGNANCY  CERVICOVAGINAL ANCILLARY ONLY   Urine pregnancy test is negative EKG   Radiology No results found.  Procedures Procedures (including critical care time)  Medications Ordered in UC Medications - No data to display  Initial Impression / Assessment and Plan / UC Course  I have reviewed the triage vital signs and the nursing notes.  Pertinent labs  results that were available during my care of the patient were reviewed by me and considered in my medical decision making (see chart for details).  STD screen  She will be called when results come back if positive.  She declined HIV test, but told she should have it repeated in August this year which would be 3 months after her last one.     Final Clinical Impressions(s) / UC Diagnoses   Final diagnoses:  High risk sexual behavior, unspecified type  STD exposure  Screen for STD (sexually transmitted disease)     Discharge Instructions      Your pregnancy test was negative. We will inform you if any of your STD test come back positive.      ED Prescriptions   None    PDMP not reviewed this encounter.   Garey Ham, [place] 06/26/23 1812

## 2023-06-26 NOTE — Discharge Instructions (Addendum)
Your pregnancy test was negative. We will inform you if any of your STD test come back positive.

## 2023-06-26 NOTE — ED Triage Notes (Signed)
Patient here today to have STD screening. She is not having any symptoms. She recently tested at her OBGYN office last month.

## 2023-06-27 ENCOUNTER — Telehealth (HOSPITAL_COMMUNITY): Payer: Self-pay | Admitting: Emergency Medicine

## 2023-06-27 LAB — CERVICOVAGINAL ANCILLARY ONLY
Bacterial Vaginitis (gardnerella): NEGATIVE
Candida Glabrata: NEGATIVE
Candida Vaginitis: POSITIVE — AB
Chlamydia: NEGATIVE
Comment: NEGATIVE
Comment: NEGATIVE
Comment: NEGATIVE
Comment: NEGATIVE
Comment: NORMAL
Neisseria Gonorrhea: NEGATIVE

## 2023-06-27 MED ORDER — FLUCONAZOLE 150 MG PO TABS: 150.0000 mg | ORAL_TABLET | Freq: Once | ORAL | 0 refills | Status: AC

## 2023-07-15 ENCOUNTER — Encounter (HOSPITAL_COMMUNITY): Payer: Self-pay

## 2023-07-15 ENCOUNTER — Ambulatory Visit (HOSPITAL_COMMUNITY)
Admission: RE | Admit: 2023-07-15 | Discharge: 2023-07-15 | Disposition: A | Discharge status: Home / Self Care | Payer: Medicaid Other | Source: Ambulatory Visit | Attending: Physician Assistant | Admitting: Physician Assistant

## 2023-07-15 VITALS — BP 110/72 | HR 81 | Temp 98.3°F | Resp 14

## 2023-07-15 DIAGNOSIS — N390 Urinary tract infection, site not specified: Secondary | ICD-10-CM | POA: Diagnosis not present

## 2023-07-15 DIAGNOSIS — R102 Pelvic and perineal pain: Secondary | ICD-10-CM | POA: Diagnosis not present

## 2023-07-15 LAB — POCT URINALYSIS DIP (MANUAL ENTRY)
Bilirubin, UA: NEGATIVE
Glucose, UA: NEGATIVE mg/dL
Nitrite, UA: NEGATIVE
Protein Ur, POC: NEGATIVE mg/dL
Spec Grav, UA: 1.02 (ref 1.010–1.025)
Urobilinogen, UA: 0.2 E.U./dL
pH, UA: 6.5 (ref 5.0–8.0)

## 2023-07-15 LAB — POCT URINE PREGNANCY: Preg Test, Ur: NEGATIVE

## 2023-07-15 MED ORDER — NITROFURANTOIN MONOHYD MACRO 100 MG PO CAPS: 100.0000 mg | ORAL_CAPSULE | Freq: Two times a day (BID) | ORAL | 0 refills | Status: DC

## 2023-07-15 NOTE — ED Triage Notes (Signed)
Pt reports that she had abd pains for week or so that is intermittent. Pt reports that she had a yeast infection but took medications for it.

## 2023-07-15 NOTE — Discharge Instructions (Addendum)
Your urine had some evidence of infection.  We are going to start antibiotic.  We will send this for culture and contact you if any to stop or change your antibiotics.  Make sure that you rest and drink plenty of fluid.  I will contact you if your swab is abnormal.  I would like you to follow-up with your primary care as well as an OB/GYN.  Call to schedule an appointment.  If you have any worsening or changing symptoms including persistent pain, worsening abdominal pain, fever, nausea, vomiting, blood in your urine, weakness you need to be seen immediately.

## 2023-07-15 NOTE — ED Provider Notes (Signed)
[facility]    CSN: [id number] Arrival date & time: 07/15/23  1859      History   Chief Complaint Chief Complaint  Patient presents with   appt 7   Abdominal Pain    HPI Cassie Nguyen is a 27 y.o. female.   Patient presents today with a several week history of intermittent lower abdominal pain.  She reports pain is intermittent without identifiable trigger.  She will have a sharp pain that lasts for a few minutes and then resolves.  She was seen by our clinic 06/26/2023 at which point she tested positive for yeast.  She has not been sexually active since that time and has no specific concern for STI.  Denies any urinary symptoms including frequency, urgency, hematuria.  She has not tried any over-the-counter medication for symptom management.  Denies any fever, nausea, vomiting, changes in her bowel habits.  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  There was discussion that colposcopy may be warranted given this was the second time she has had a normal Pap but does not appear this is ever completed as she was lost to follow-up.  She does report some irregular menstrual cycles including bleeding between menstrual periods.  LMP 06/30/2023 which ended on 07/05/2023.  She has had some spotting intermittently since then but no significant bleeding.    Past Medical History:  Diagnosis Date   Asthma    Chlamydia    Gonorrhea    Trichomonas infection     Patient Active Problem List   Diagnosis Date Noted   Irregular periods/menstrual cycles 03/17/2023   Acute otalgia, right 03/17/2023   Possible exposure to STD 03/17/2023   Marijuana use 03/17/2023   Elevated liver enzymes 02/08/2023   Right upper quadrant abdominal pain 11/09/2022   High risk heterosexual behavior 11/09/2022   Abnormal vaginal bleeding 11/09/2022   ASC-H Pap smear 05/02/2021   Trichimoniasis 05/02/2021   Cervical dysplasia 07/27/2019   Abnormal uterine bleeding  (AUB) 07/20/2019   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 Authorizing Provider  nitrofurantoin, macrocrystal-monohydrate, (MACROBID) 100 MG capsule Take 1 capsule (100 mg total) by mouth 2 (two) times daily. 07/15/23  Yes Cobey Raineri K, PA-C  albuterol (VENTOLIN HFA) 108 (90 Base) MCG/ACT inhaler Inhale 1-2 puffs into the lungs every 6 (six) hours as needed for wheezing or shortness of breath. 05/10/22   Mardella Layman, MD  ibuprofen (ADVIL) 800 MG tablet Take 1 tablet (800 mg total) by mouth 3 (three) times daily. 04/09/23   Valinda Hoar, NP  medroxyPROGESTERone (PROVERA) 10 MG tablet Take 1 tablet (10 mg total) by mouth daily. Patient not taking: Reported on 07/20/2019 06/25/19 02/26/20  Merrilee Jansky, MD    Family History Family History  Problem Relation Age of Onset  Cancer Mother     Social History Social History   Tobacco Use   Smoking status: Never   Smokeless tobacco: Never  Vaping Use   Vaping status: Never Used  Substance Use Topics   Alcohol use: Yes    Comment: occasionally   Drug use: Not Currently    Types: Marijuana     Allergies   Peanut-containing drug products, Shrimp [shellfish allergy], Watermelon flavor, and Lactase   Review of Systems Review of Systems  Constitutional:  Positive for activity change. Negative for appetite change, fatigue and fever.  Gastrointestinal:  Positive for abdominal pain. Negative for diarrhea, nausea and vomiting.  Genitourinary:  Positive for menstrual problem, pelvic pain and vaginal bleeding. Negative for dysuria, flank pain, frequency,  hematuria, urgency, vaginal discharge and vaginal pain.     Physical Exam Triage Vital Signs ED Triage Vitals [07/15/23 1914]  Encounter Vitals Group     BP 110/72     Systolic BP Percentile      Diastolic BP Percentile      Pulse Rate 81     Resp 14     Temp 98.3 F (36.8 C)     Temp Source Oral     SpO2 97 %     Weight      Height      Head Circumference      Peak Flow      Pain Score      Pain Loc      Pain Education      Exclude from Growth Chart    No data found.  Updated Vital Signs BP 110/72 (BP Location: Left Arm)   Pulse 81   Temp 98.3 F (36.8 C) (Oral)   Resp 14   LMP 06/30/2023 (Exact Date)   SpO2 97%   Visual Acuity Right Eye Distance:   Left Eye Distance:   Bilateral Distance:    Right Eye Near:   Left Eye Near:    Bilateral Near:     Physical Exam Vitals reviewed. Exam conducted with a chaperone present.  Constitutional:      General: She is awake. She is not in acute distress.    Appearance: Normal appearance. She is well-developed. She is not ill-appearing.     Comments: Very pleasant female appears stated age in no acute distress sitting comfortably in exam room  HENT:     Head: Normocephalic and atraumatic.  Cardiovascular:     Rate and Rhythm: Normal rate and regular rhythm.     Heart sounds: Normal heart sounds, S1 normal and S2 normal. No murmur heard. Pulmonary:     Effort: Pulmonary effort is normal.     Breath sounds: Normal breath sounds. No wheezing, rhonchi or rales.     Comments: Clear to auscultation bilaterally Abdominal:     General: Bowel sounds are normal.     Palpations: Abdomen is soft.     Tenderness: There is no abdominal tenderness. There is no right CVA tenderness, left CVA tenderness, guarding or rebound.     Comments: Benign abdominal exam.  No significant tenderness to palpation.  Genitourinary:    Labia:        Right: No rash or tenderness.        Left: No rash or tenderness.      Vagina: Normal. No  vaginal discharge or bleeding.     Cervix: Cervical bleeding present. No cervical motion tenderness.     Uterus: Normal.      Adnexa:  Right adnexa normal and left adnexa normal.     Comments: Small amount of blood noted cervical os.  No CMT tenderness or pain with bimanual exam.  Normal-appearing cervix.  Geraldine Contras, CMA present as chaperone during exam. Psychiatric:        Behavior: Behavior is cooperative.      UC Treatments / Results  Labs (all labs ordered are listed, but only abnormal results are displayed) Labs Reviewed  POCT URINALYSIS DIP (MANUAL ENTRY) - Abnormal; Notable for the following components:      Result Value   Color, UA straw (*)    Ketones, POC UA trace (5) (*)    Blood, UA moderate (*)    Leukocytes, UA Small (1+) (*)    All other components within normal limits  URINE CULTURE  POCT URINE PREGNANCY  CERVICOVAGINAL ANCILLARY ONLY    EKG   Radiology No results found.  Procedures Procedures (including critical care time)  Medications Ordered in UC Medications - No data to display  Initial Impression / Assessment and Plan / UC Course  I have reviewed the triage vital signs and the nursing notes.  Pertinent labs & imaging results that were available during my care of the patient were reviewed by me and considered in my medical decision making (see chart for details).     Patient is well-appearing, afebrile, nontoxic, nontachycardic.  Vital signs and physical exam are reassuring today with no indication for emergent evaluation or imaging.  No CMT tenderness or adnexal tenderness on exam concerning for PID.Urine pregnancy was negative.  UA had trace blood as well as leukocyte esterase.  Given her suprapubic tenderness will treat for UTI.  She was given Macrobid with instruction take this twice daily for 5 days.  Will send this for culture and we will contact her if need to stop or change her antibiotics based on these results.  STI swab was collected during  pelvic exam and is pending.  Will contact her if any to review treatment based on this result.  She was encouraged to push fluids and eat a bland diet.  Discussed that if she has any worsening or changing symptoms including worsening abdominal pain, pelvic pain, fever, nausea, vomiting she needs to be seen immediately.  Strict return precautions given.  Work excuse note provided.  Given her history of abnormal Pap smears we did discuss the importance of following up with OB/GYN.  Her cervix is normal-appearing on exam today.  She was given contact information for local provider with instruction to call to schedule appointment as soon as possible.  Final Clinical Impressions(s) / UC Diagnoses   Final diagnoses:  Suprapubic pain  Acute UTI     Discharge Instructions      Your urine had some evidence of infection.  We are going to start antibiotic.  We will send this for culture and contact you if any to stop or change your antibiotics.  Make sure that you rest and drink plenty of fluid.  I will contact you if your swab is abnormal.  I would like you to follow-up with your primary care as well as an OB/GYN.  Call to schedule an appointment.  If you have any worsening or changing symptoms including persistent pain, worsening abdominal pain, fever, nausea, vomiting, blood in your urine, weakness you need to be seen immediately.     ED Prescriptions     Medication Sig Dispense Auth. Provider   nitrofurantoin, macrocrystal-monohydrate, (MACROBID) 100 MG capsule Take 1 capsule (  100 mg total) by mouth 2 (two) times daily. 10 capsule Kania Regnier, Noberto Retort, PA-C      PDMP not reviewed this encounter.   Jeani Hawking, PA-C 07/15/23 2105

## 2023-07-16 LAB — CERVICOVAGINAL ANCILLARY ONLY
Bacterial Vaginitis (gardnerella): NEGATIVE
Candida Glabrata: NEGATIVE
Candida Vaginitis: NEGATIVE
Chlamydia: NEGATIVE
Comment: NEGATIVE
Comment: NEGATIVE
Comment: NEGATIVE
Comment: NEGATIVE
Comment: NEGATIVE
Comment: NORMAL
Neisseria Gonorrhea: NEGATIVE
Trichomonas: POSITIVE — AB

## 2023-07-17 ENCOUNTER — Telehealth (HOSPITAL_COMMUNITY): Payer: Self-pay | Admitting: Emergency Medicine

## 2023-07-17 LAB — URINE CULTURE: Culture: NO GROWTH

## 2023-07-17 MED ORDER — METRONIDAZOLE 500 MG PO TABS: 500.0000 mg | ORAL_TABLET | Freq: Two times a day (BID) | ORAL | 0 refills | Status: DC

## 2023-08-02 ENCOUNTER — Ambulatory Visit (HOSPITAL_COMMUNITY): Payer: Self-pay

## 2023-08-05 ENCOUNTER — Ambulatory Visit (HOSPITAL_COMMUNITY)
Admission: RE | Admit: 2023-08-05 | Discharge: 2023-08-05 | Disposition: A | Discharge status: Home / Self Care | Payer: Medicaid Other | Source: Ambulatory Visit | Attending: Urgent Care | Admitting: Urgent Care

## 2023-08-05 ENCOUNTER — Encounter (HOSPITAL_COMMUNITY): Payer: Self-pay

## 2023-08-05 VITALS — BP 94/65 | HR 89 | Temp 98.3°F | Resp 16 | Ht 63.0 in | Wt 128.0 lb

## 2023-08-05 DIAGNOSIS — J4521 Mild intermittent asthma with (acute) exacerbation: Secondary | ICD-10-CM | POA: Diagnosis not present

## 2023-08-05 DIAGNOSIS — K30 Functional dyspepsia: Secondary | ICD-10-CM | POA: Diagnosis not present

## 2023-08-05 MED ORDER — FLOVENT HFA 110 MCG/ACT IN AERO
1.0000 | INHALATION_SPRAY | Freq: Two times a day (BID) | RESPIRATORY_TRACT | 12 refills | Status: DC
Start: 2023-08-05 — End: 2024-06-04

## 2023-08-05 MED ORDER — FAMOTIDINE 20 MG PO TABS
20.0000 mg | ORAL_TABLET | Freq: Two times a day (BID) | ORAL | 0 refills | Status: DC
Start: 2023-08-05 — End: 2024-04-27

## 2023-08-05 NOTE — Discharge Instructions (Addendum)
Recommend initially taking an acid blocker medication such as famotidine for her heartburn.  Take this medication prior to eating foods that typically causes your symptoms of heartburn.  This medicine should reduce acid production for about 6 hours and can be taken multiple times during the day.  If you find this medicine is not working, then recommend trying OTC omeprazole 20 mg.  This is a once daily medication.  Use the new asthma medication, 1 puff in the morning and 1 puff at night to see if it improves your complaint of problem with your breathing.  We are unable to retest you today for trichomonas because your treatment was completed less than 3 weeks ago.  You can return for retesting on or after August 15.

## 2023-08-05 NOTE — ED Provider Notes (Signed)
[facility]    CSN: [id number] Arrival date & time: 08/05/23  1903      History   Chief Complaint Chief Complaint  Patient presents with   Follow-up    Entered by patient    HPI Cassie Nguyen is a 27 y.o. female.   HPI  Patient presents to urgent care for follow-up of trichomonas diagnosis posttreatment.  She request retesting.  Also expresses complaint of worsening heartburn.  Accompanied by some abdominal pain and "bitter taste" in her mouth.  She denies using any over-the-counter medication to treat her symptoms.  She also expresses a concern for symptoms of asthma exacerbation which occur when she has air conditioning blowing on her.  She states the symptoms occur frequently when she is at work.  She states she uses albuterol inhaler at home which gives her some relief of her symptoms but tries to avoid using.  She denies any PCP or active treatment for asthma symptoms.  Past Medical History:  Diagnosis Date   Asthma    Chlamydia    Gonorrhea    Trichomonas infection     Patient Active Problem List   Diagnosis Date Noted   Irregular periods/menstrual cycles 03/17/2023   Acute otalgia, right 03/17/2023   Possible exposure to STD 03/17/2023   Marijuana use 03/17/2023   Elevated liver enzymes 02/08/2023   Right upper quadrant abdominal pain 11/09/2022   High risk heterosexual behavior 11/09/2022   Abnormal vaginal bleeding 11/09/2022   ASC-H Pap smear 05/02/2021   Trichimoniasis 05/02/2021   Cervical dysplasia 07/27/2019   Abnormal uterine bleeding (AUB) 07/20/2019   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 Authorizing Provider  albuterol (VENTOLIN HFA) 108 (90 Base) MCG/ACT inhaler Inhale 1-2 puffs into the lungs every 6 (six) hours as needed for wheezing or shortness of breath. 05/10/22   Mardella Layman, MD  ibuprofen (ADVIL) 800 MG tablet Take 1 tablet (800 mg total) by mouth 3 (three) times daily. 04/09/23   White, Elita Boone, NP  metroNIDAZOLE (FLAGYL) 500 MG tablet Take 1 tablet (500 mg total) by mouth 2 (two) times daily. 07/17/23   LampteyBritta Mccreedy, MD  medroxyPROGESTERone (PROVERA) 10 MG tablet Take 1 tablet (10 mg total) by mouth daily. Patient not taking: Reported on 07/20/2019 06/25/19 02/26/20  Merrilee Jansky, MD    Family History Family History  Problem Relation Age of Onset   Cancer Mother     Social History Social History   Tobacco Use   Smoking status: Never   Smokeless tobacco: Never  Vaping Use   Vaping status: Never Used  Substance Use Topics   Alcohol use: Yes    Comment: occasionally   Drug use: Not Currently    Types: Marijuana     Allergies   Peanut-containing drug products, Shrimp [shellfish allergy], Watermelon flavor,  and Lactase   Review of Systems Review of Systems   Physical Exam Triage Vital Signs ED Triage Vitals  Encounter Vitals Group     BP 08/05/23 1923 94/65     Systolic BP Percentile --      Diastolic BP Percentile --      Pulse Rate 08/05/23 1923 89     Resp 08/05/23 1923 16     Temp 08/05/23 1923 98.3 F (36.8 C)     Temp Source 08/05/23 1923 Oral     SpO2 08/05/23 1923 96 %     Weight 08/05/23 1922 128 lb (58.1 kg)     Height 08/05/23 1922 5\' 3"  (1.6 m)     Head Circumference --      Peak Flow --      Pain Score 08/05/23 1922 0     Pain Loc --      Pain Education --      Exclude from Growth Chart --    No data found.  Updated Vital Signs BP 94/65 (BP Location: Right Arm)   Pulse 89   Temp 98.3 F (36.8 C) (Oral)   Resp 16   Ht 5\' 3"  (1.6 m)   Wt 128 lb (58.1 kg)   LMP  06/30/2023 (Exact Date)   SpO2 96%   BMI 22.67 kg/m   Visual Acuity Right Eye Distance:   Left Eye Distance:   Bilateral Distance:    Right Eye Near:   Left Eye Near:    Bilateral Near:     Physical Exam Vitals reviewed.  Constitutional:      Appearance: Normal appearance.  Cardiovascular:     Rate and Rhythm: Normal rate and regular rhythm.     Pulses: Normal pulses.     Heart sounds: Normal heart sounds.  Pulmonary:     Effort: Pulmonary effort is normal.     Breath sounds: Normal breath sounds.  Abdominal:     General: Abdomen is flat.     Palpations: Abdomen is soft.     Tenderness: There is no abdominal tenderness.  Skin:    General: Skin is warm and dry.  Neurological:     General: No focal deficit present.     Mental Status: She is alert and oriented to person, place, and time.  Psychiatric:        Mood and Affect: Mood normal.        Behavior: Behavior normal.      UC Treatments / Results  Labs (all labs ordered are listed, but only abnormal results are displayed) Labs Reviewed - No data to display  EKG   Radiology No results found.  Procedures Procedures (including critical care time)  Medications Ordered in UC Medications - No data to display  Initial Impression / Assessment and Plan / UC Course  I have reviewed the triage vital signs and the nursing notes.  Pertinent labs & imaging results that were available during my care of the patient were reviewed by me and considered in my medical decision making (see chart for details).   Cassie Nguyen is a 27 y.o. female presenting with acid indigestion and asthma flare. Patient is afebrile without recent antipyretics, satting well on room air. Overall is well appearing, well hydrated, without respiratory distress. Pulmonary exam is unremarkable.  Lungs CTAB without wheezing, rhonchi, rales. RRR without murmurs, rubs, gallops.  Abdomen is soft, flat and nontender.  Reviewed relevant chart  history.   Informed the patient that trichomoniasis  retesting is unnecessary, however if she wants to verify cure, she needs to wait at least 3 weeks following completion of the medication.  No abdominal tenderness during exam today.  Based on her symptoms, I suspect acid indigestion with unknown trigger.  Recommending using famotidine twice a day or as needed prior to eating known trigger food.  Lungs CTAB today without wheezing or other adventitious lung sounds.  Patient endorses frequent use of albuterol at home and so will start her on Flovent and see if her symptoms improve.  Encouraged her to schedule visit with a primary care provider for follow-up.  Counseled patient on potential for adverse effects with medications prescribed/recommended today, ER and return-to-clinic precautions discussed, patient verbalized understanding and agreement with care plan.   Final Clinical Impressions(s) / UC Diagnoses   Final diagnoses:  None   Discharge Instructions   None    ED Prescriptions   None    PDMP not reviewed this encounter.   Charma Igo, [place] 08/05/23 1951

## 2023-08-05 NOTE — ED Triage Notes (Addendum)
Patient here today for follow up of when she was positive for Trich. Patient would like to be tested again to make sure she is not positive anymore.   She is also having some heartburn that has worsened this year. Has not tried taking anything OTC. Sometimes she will have some abd pain. She is also concerned of a bitter taste in her mouth sometimes.

## 2023-08-19 ENCOUNTER — Other Ambulatory Visit: Payer: Self-pay

## 2023-08-19 ENCOUNTER — Encounter (HOSPITAL_COMMUNITY): Payer: Self-pay

## 2023-08-19 ENCOUNTER — Ambulatory Visit (HOSPITAL_COMMUNITY)
Admission: RE | Admit: 2023-08-19 | Discharge: 2023-08-19 | Disposition: A | Discharge status: Home / Self Care | Payer: Medicaid Other | Source: Ambulatory Visit | Attending: Internal Medicine | Admitting: Internal Medicine

## 2023-08-19 VITALS — BP 108/73 | HR 87 | Temp 98.3°F | Resp 16

## 2023-08-19 DIAGNOSIS — R310 Gross hematuria: Secondary | ICD-10-CM | POA: Diagnosis not present

## 2023-08-19 DIAGNOSIS — Z202 Contact with and (suspected) exposure to infections with a predominantly sexual mode of transmission: Secondary | ICD-10-CM | POA: Diagnosis not present

## 2023-08-19 LAB — POCT URINALYSIS DIP (MANUAL ENTRY)
Bilirubin, UA: NEGATIVE
Glucose, UA: NEGATIVE mg/dL
Leukocytes, UA: NEGATIVE
Nitrite, UA: NEGATIVE
Protein Ur, POC: NEGATIVE mg/dL
Spec Grav, UA: 1.025 (ref 1.010–1.025)
Urobilinogen, UA: 0.2 E.U./dL
pH, UA: 5.5 (ref 5.0–8.0)

## 2023-08-19 MED ORDER — METRONIDAZOLE 500 MG PO TABS: 500.0000 mg | ORAL_TABLET | Freq: Two times a day (BID) | ORAL | 0 refills | Status: DC

## 2023-08-19 NOTE — ED Triage Notes (Signed)
Pt reports Lump on Lt side of neck and RT groin . Pt first noticed lumps over the weekend.

## 2023-08-19 NOTE — ED Provider Notes (Signed)
[facility]    CSN: [id number] Arrival date & time: 08/19/23  1908      History   Chief Complaint Chief Complaint  Patient presents with   lump on neck    HPI Cassie Nguyen is a 27 y.o. female.   27 year old female who presents to urgent care with complaints of possible reexposure to STD as well as complaints of tender mass in the left neck along the sternocleidomastoid and the right groin.  She reports that she was diagnosed with trichomonas recently and treated and that after that she went back to her partner that also had it.  Her partner was not treated.  She reports that she went to the beach this weekend and started developing the tenderness along the neck and groin.  She also relates blood in her urine, vaginal pain, suprapubic pain and discharge.  She denies fevers or chills.  She denies nausea or vomiting.    Past Medical History:  Diagnosis Date   Asthma    Chlamydia    Gonorrhea    Trichomonas infection     Patient Active Problem List   Diagnosis Date Noted   Irregular periods/menstrual cycles 03/17/2023   Acute otalgia, right 03/17/2023   Possible exposure to STD 03/17/2023   Marijuana use 03/17/2023   Elevated liver enzymes 02/08/2023   Right upper quadrant abdominal pain 11/09/2022   High risk heterosexual behavior 11/09/2022   Abnormal vaginal bleeding 11/09/2022   ASC-H Pap smear 05/02/2021   Trichimoniasis 05/02/2021   Cervical dysplasia 07/27/2019   Abnormal uterine bleeding (AUB) 07/20/2019   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 Authorizing  Provider  albuterol (VENTOLIN HFA) 108 (90 Base) MCG/ACT inhaler Inhale 1-2 puffs into the lungs every 6 (six) hours as needed for wheezing or shortness of breath. 05/10/22  Yes Mardella Layman, MD  famotidine (PEPCID) 20 MG tablet Take 1 tablet (20 mg total) by mouth 2 (two) times daily. 08/05/23  Yes Immordino, Jeannett Senior, FNP  FLOVENT HFA 110 MCG/ACT inhaler Inhale 1 puff into the lungs in the morning and at bedtime. 08/05/23  Yes Immordino, Jeannett Senior, FNP  ibuprofen (ADVIL) 800 MG tablet Take 1 tablet (800 mg total) by mouth 3 (three) times daily. 04/09/23  Yes Amor Hyle, Adrienne R, NP  metroNIDAZOLE (FLAGYL) 500 MG tablet Take 1 tablet (500 mg total) by mouth 2 (two) times daily. 07/17/23   LampteyBritta Mccreedy, MD  medroxyPROGESTERone (PROVERA) 10 MG tablet Take 1 tablet (10 mg total) by mouth daily. Patient not taking: Reported on 07/20/2019 06/25/19 02/26/20  Merrilee Jansky, MD    Family History Family History  Problem Relation Age of Onset   Cancer Mother     Social History Social History   Tobacco Use   Smoking status: Never   Smokeless tobacco: Never  Vaping Use  Vaping status: Never Used  Substance Use Topics   Alcohol use: Yes    Comment: occasionally   Drug use: Not Currently    Types: Marijuana     Allergies   Peanut-containing drug products, Shrimp [shellfish allergy], Watermelon flavor, and Lactase   Review of Systems Review of Systems  Constitutional:  Negative for chills and fever.  HENT:  Negative for ear pain and sore throat.   Eyes:  Negative for pain and visual disturbance.  Respiratory:  Negative for cough and shortness of breath.   Cardiovascular:  Negative for chest pain and palpitations.  Gastrointestinal:  Positive for abdominal pain (Suprapubic). Negative for vomiting.  Genitourinary:  Positive for dysuria, frequency, hematuria, pelvic pain, vaginal discharge and vaginal pain.  Musculoskeletal:  Negative for arthralgias and back pain.  Skin:  Negative for color  change and rash.  Neurological:  Negative for seizures and syncope.  All other systems reviewed and are negative.    Physical Exam Triage Vital Signs ED Triage Vitals  Encounter Vitals Group     BP 08/19/23 1922 108/73     Systolic BP Percentile --      Diastolic BP Percentile --      Pulse Rate 08/19/23 1922 87     Resp 08/19/23 1922 16     Temp 08/19/23 1922 98.3 F (36.8 C)     Temp src --      SpO2 08/19/23 1922 96 %     Weight --      Height --      Head Circumference --      Peak Flow --      Pain Score 08/19/23 1920 8     Pain Loc --      Pain Education --      Exclude from Growth Chart --    No data found.  Updated Vital Signs BP 108/73   Pulse 87   Temp 98.3 F (36.8 C)   Resp 16   SpO2 96%   Visual Acuity Right Eye Distance:   Left Eye Distance:   Bilateral Distance:    Right Eye Near:   Left Eye Near:    Bilateral Near:     Physical Exam Vitals and nursing note reviewed.  Constitutional:      General: She is not in acute distress.    Appearance: She is well-developed.  HENT:     Head: Normocephalic and atraumatic.  Eyes:     Conjunctiva/sclera: Conjunctivae normal.  Neck:   Cardiovascular:     Rate and Rhythm: Normal rate and regular rhythm.     Heart sounds: No murmur heard. Pulmonary:     Effort: Pulmonary effort is normal. No respiratory distress.     Breath sounds: Normal breath sounds.  Abdominal:     Palpations: Abdomen is soft.     Tenderness: There is abdominal tenderness (Mild lower abd.).  Musculoskeletal:        General: No swelling.     Cervical back: Neck supple.  Skin:    General: Skin is warm and dry.     Capillary Refill: Capillary refill takes less than 2 seconds.       Neurological:     Mental Status: She is alert.  Psychiatric:        Mood and Affect: Mood normal.     UC Treatments / Results  Labs (all labs ordered are listed, but only abnormal results are displayed) Labs Reviewed - No data to  display  EKG   Radiology No results found.  Procedures Procedures (including critical care time)  Medications Ordered in UC Medications - No data to display  Initial Impression / Assessment and Plan / UC Course  I have reviewed the triage vital signs and the nursing notes.  Pertinent labs & imaging results that were available during my care of the patient were reviewed by me and considered in my medical decision making (see chart for details).     1.  STD exposure: Given the patient's symptoms we have tested for GC, chlamydia, bacterial vaginitis, trichomonas.  Given her recent exposure to trichomonas we will treat with Flagyl 500 mg 2 times daily for 7 days.  Looking back through the patient's history she also has a history of abnormal Paps and recommend that she follow-up with a GYN for recheck. 2.    Hematuria: Urinalysis done today which does show gross red blood cells.  Will send for culture.  Pending this patient may need course of antibiotics.  Although this could be secondary to a poor clean catch as she is having some spotting Final Clinical Impressions(s) / UC Diagnoses   Final diagnoses:  None   Discharge Instructions   None    ED Prescriptions   None    PDMP not reviewed this encounter.   Landis Martins, [place] 08/19/23 2028

## 2023-08-19 NOTE — Discharge Instructions (Signed)
Your test results will be available in 1 to 2 days on MyChart.  If anything returns back positive we will contact you.  Pick up prescription for metronidazole and take 2 tablets daily for 7 days.  Make a follow-up appointment with gynecology for abnormal bleeding and history of abnormal Pap smears

## 2023-08-20 ENCOUNTER — Telehealth: Payer: Self-pay | Admitting: *Deleted

## 2023-08-20 LAB — CERVICOVAGINAL ANCILLARY ONLY
Bacterial Vaginitis (gardnerella): NEGATIVE
Candida Glabrata: NEGATIVE
Candida Vaginitis: NEGATIVE
Chlamydia: NEGATIVE
Comment: NEGATIVE
Comment: NEGATIVE
Comment: NEGATIVE
Comment: NEGATIVE
Comment: NEGATIVE
Comment: NORMAL
Neisseria Gonorrhea: NEGATIVE
Trichomonas: NEGATIVE

## 2023-08-20 LAB — URINE CULTURE: Culture: NO GROWTH

## 2023-08-20 NOTE — Telephone Encounter (Signed)
Patient called office to get an appt scheduled with Dr. Alvis Lemmings. She had questions regarding lab test, UA results from UC. Advised patient to wait for urine culture results that were pending. Advised to take ATB given at UC x 7 days and refrain from any sexual encounters x 7 days after completing ATB.  Patient verbalized understanding.   Patient also wanted to discuss previous dx. From pap smear. Advised she could ask for referral with Dr. Alvis Lemmings at appt scheduled on 09/03/2024 at 0950.  For further evaluation.  Patient verbalized understanding.

## 2023-09-04 ENCOUNTER — Ambulatory Visit: Payer: Medicaid Other | Attending: Family Medicine | Admitting: Family Medicine

## 2023-09-04 ENCOUNTER — Other Ambulatory Visit (HOSPITAL_COMMUNITY)
Admission: RE | Admit: 2023-09-04 | Discharge: 2023-09-04 | Disposition: A | Discharge status: Home / Self Care | Payer: Medicaid Other | Source: Ambulatory Visit | Attending: Family Medicine | Admitting: Family Medicine

## 2023-09-04 VITALS — BP 109/76 | HR 71 | Ht 63.0 in | Wt 132.0 lb

## 2023-09-04 DIAGNOSIS — R319 Hematuria, unspecified: Secondary | ICD-10-CM | POA: Diagnosis not present

## 2023-09-04 DIAGNOSIS — B354 Tinea corporis: Secondary | ICD-10-CM

## 2023-09-04 DIAGNOSIS — J4520 Mild intermittent asthma, uncomplicated: Secondary | ICD-10-CM

## 2023-09-04 LAB — POCT URINALYSIS DIP (CLINITEK)
Bilirubin, UA: NEGATIVE
Glucose, UA: NEGATIVE mg/dL
Ketones, POC UA: NEGATIVE mg/dL
Leukocytes, UA: NEGATIVE
Nitrite, UA: NEGATIVE
POC PROTEIN,UA: NEGATIVE
Spec Grav, UA: 1.02 (ref 1.010–1.025)
Urobilinogen, UA: 0.2 U/dL
pH, UA: 6 (ref 5.0–8.0)

## 2023-09-04 NOTE — Patient Instructions (Signed)
Body Ringworm Body ringworm is an infection of the skin that often causes a ring-shaped rash. Body ringworm is also called tinea corporis. Body ringworm can affect any part of your skin. This condition is easily spread from person to person (is very contagious). What are the causes? This condition is caused by fungi called dermatophytes. The condition develops when these fungi grow out of control on the skin. You can get this condition if you touch a person or animal that has it. You can also get it if you share any items with an infected person or pet. These include: Clothing, bedding, and towels. Brushes or combs. Gym equipment. Any other object that has the fungus on it. What increases the risk? You are more likely to develop this condition if you: Play sports that involve close physical contact, such as wrestling. Sweat a lot. Live in areas that are hot and humid. Use public showers. Have a weakened disease-fighting system (immune system). What are the signs or symptoms? Symptoms of this condition include: Itchy, raised red spots and bumps. Red scaly patches. A ring-shaped rash. The rash may have: A clear center. Scales or red bumps at its center. Redness near its borders. Dry and scaly skin on or around it. How is this diagnosed? This condition can usually be diagnosed with a skin exam. A skin scraping may be taken from the affected area and examined under a microscope to see if the fungus is present. How is this treated? This condition may be treated with: An antifungal cream or ointment. An antifungal shampoo. Antifungal medicines. These may be prescribed if your ringworm: Is severe. Keeps coming back or lasts a long time. Follow these instructions at home: Take over-the-counter and prescription medicines only as told by your health care provider. If you were given an antifungal cream or ointment: Use it as told by your health care provider. Wash the infected area and  dry it completely before applying the cream or ointment. If you were given an antifungal shampoo: Use it as told by your health care provider. Leave the shampoo on your body for 3-5 minutes before rinsing. While you have a rash: Wear loose clothing to stop clothes from rubbing and irritating it. Wash or change your bed sheets every night. Wash clothes and bed sheets in hot water. Disinfect or throw out items that may be infected. Wash your hands often with soap and water for at least 20 seconds. If soap and water are not available, use hand sanitizer. If your pet has the same infection, take your pet to see a veterinarian for treatment. How is this prevented? Take a bath or shower every day and after every time you work out or play sports. Dry your skin completely after bathing. Wear sandals or shoes in public places and showers. Wash athletic clothes after each use. Do not share personal items with others. Avoid touching red patches of skin on other people. Avoid touching pets that have bald spots. If you touch an animal that has a bald spot, wash your hands. Contact a health care provider if: Your rash continues to spread after 7 days of treatment. Your rash is not gone in 4 weeks. The area around your rash gets red, warm, tender, and swollen. This information is not intended to replace advice given to you by your health care provider. Make sure you discuss any questions you have with your health care provider. Document Revised: 05/31/2022 Document Reviewed: 05/31/2022 [employer] Patient Education  2024 [employer]  

## 2023-09-04 NOTE — Progress Notes (Signed)
Subjective:  Patient ID: Cassie Nguyen, female    DOB: Jan 23, 1996  Age: 27 y.o. MRN: [id number]  CC: Pelvic Pain (Pt would like her urine check.)   HPI Cassie Nguyen is a 27 y.o. year old female  with a history of Asthma here today for an acute visit.  Interval History: Discussed the use of AI scribe software for clinical note transcription with the patient, who gave verbal consent to proceed.   The patient, with a history of asthma, presents with a skin condition suspected to be ringworm. She noticed the condition approximately two to three days ago, after her brother, who has a similar condition, brought it to her attention. The patient suspects she may have contracted the condition from her seven cats, one of which recently gave birth.  In addition, the patient mentions a previous urine test, conducted on August 19, which showed blood and was described as cloudy. She denies any current pelvic pain or flank pain or dysuria.  The patient's asthma appears to be triggered by cold air, specifically from air conditioning. She manages these episodes by moving away from the cold air or using her inhaler.     Past Medical History:  Diagnosis Date   Asthma    Chlamydia    Gonorrhea    Trichomonas infection     Past Surgical History:  Procedure Laterality Date   ROOT CANAL      Family History  Problem Relation Age of Onset   Cancer Mother     Social History   Socioeconomic History   Marital status: Single    Spouse name: Not on file   Number of children: Not on file   Years of education: Not on file   Highest education level: Not on file  Occupational History   Not on file  Tobacco Use   Smoking status: Never   Smokeless tobacco: Never  Vaping Use   Vaping status: Never Used  Substance and Sexual Activity   Alcohol use: Yes    Comment: occasionally   Drug use: Not Currently    Types: Marijuana   Sexual activity: Yes    Birth control/protection:  Condom  Other Topics Concern   Not on file  Social History Narrative   Not on file   Social Determinants of Health   Financial Resource Strain: Not on file  Food Insecurity: No Food Insecurity (05/02/2021)   Hunger Vital Sign    Worried About Running Out of Food in the Last Year: Never true    Ran Out of Food in the Last Year: Never true  Transportation Needs: No Transportation Needs (05/02/2021)   PRAPARE - Administrator, Civil Service (Medical): No    Lack of Transportation (Non-Medical): No  Physical Activity: Not on file  Stress: Not on file  Social Connections: Not on file    Allergies  Allergen Reactions   Peanut-Containing Drug Products Anaphylaxis   Shrimp [Shellfish Allergy] Anaphylaxis    Makes throat itchy and dry   Watermelon Flavor Anaphylaxis   Lactase Other (See Comments)    Constipation, gi upset    Outpatient Medications Prior to Visit  Medication Sig Dispense Refill   albuterol (VENTOLIN HFA) 108 (90 Base) MCG/ACT inhaler Inhale 1-2 puffs into the lungs every 6 (six) hours as needed for wheezing or shortness of breath. 1 each 1   famotidine (PEPCID) 20 MG tablet Take 1 tablet (20 mg total) by mouth 2 (two) times daily. 30 tablet  0   FLOVENT HFA 110 MCG/ACT inhaler Inhale 1 puff into the lungs in the morning and at bedtime. 1 each 12   ibuprofen (ADVIL) 800 MG tablet Take 1 tablet (800 mg total) by mouth 3 (three) times daily. 21 tablet 0   metroNIDAZOLE (FLAGYL) 500 MG tablet Take 1 tablet (500 mg total) by mouth 2 (two) times daily. 14 tablet 0   No facility-administered medications prior to visit.     ROS Review of Systems  Constitutional:  Negative for activity change and appetite change.  HENT:  Negative for sinus pressure and sore throat.   Respiratory:  Negative for chest tightness, shortness of breath and wheezing.   Cardiovascular:  Negative for chest pain and palpitations.  Gastrointestinal:  Negative for abdominal distention,  abdominal pain and constipation.  Genitourinary: Negative.   Musculoskeletal: Negative.   Skin:  Positive for rash.  Psychiatric/Behavioral:  Negative for behavioral problems and dysphoric mood.     Objective:  BP 109/76 (BP Location: Left Arm, Patient Position: Sitting, Cuff Size: Normal)   Pulse 71   Ht 5\' 3"  (1.6 m)   Wt 132 lb (59.9 kg)   SpO2 99%   BMI 23.38 kg/m      09/04/2023   10:04 AM 08/19/2023    7:22 PM 08/05/2023    7:23 PM  BP/Weight  Systolic BP 109 108 94  Diastolic BP 76 73 65  Wt. (Lbs) 132    BMI 23.38 kg/m2        Physical Exam Constitutional:      Appearance: She is well-developed.  Cardiovascular:     Rate and Rhythm: Normal rate.     Heart sounds: Normal heart sounds. No murmur heard. Pulmonary:     Effort: Pulmonary effort is normal.     Breath sounds: Normal breath sounds. No wheezing or rales.  Chest:     Chest wall: No tenderness.  Abdominal:     General: Bowel sounds are normal. There is no distension.     Palpations: Abdomen is soft. There is no mass.     Tenderness: There is no abdominal tenderness.  Musculoskeletal:        General: Normal range of motion.     Right lower leg: No edema.     Left lower leg: No edema.  Skin:    Comments: Well-circumscribed patch on dorsum of right forearm  Neurological:     Mental Status: She is alert and oriented to person, place, and time.  Psychiatric:        Mood and Affect: Mood normal.        Latest Ref Rng & Units 11/12/2022    7:52 AM 10/02/2021    8:43 PM 09/17/2021    5:10 PM  CMP  Glucose 70 - 99 mg/dL 87  95  161   BUN 6 - 20 mg/dL 4  6  8    Creatinine 0.57 - 1.00 mg/dL 0.96  0.45  4.09   Sodium 134 - 144 mmol/L 140  138  135   Potassium 3.5 - 5.2 mmol/L 5.0  4.1  3.7   Chloride 96 - 106 mmol/L 104  106  104   CO2 20 - 29 mmol/L 21  28  23    Calcium 8.7 - 10.2 mg/dL 9.1  9.6  9.7   Total Protein 6.0 - 8.5 g/dL 7.1   7.0   Total Bilirubin 0.0 - 1.2 mg/dL 0.2   0.8   Alkaline  Phos 44 -  121 IU/L 72   43   AST 0 - 40 IU/L 273   19   ALT 0 - 32 IU/L 49   11     Lipid Panel  No results found for: "CHOL", "TRIG", "HDL", "CHOLHDL", "VLDL", "LDLCALC", "LDLDIRECT"  CBC    Component Value Date/Time   WBC 3.4 05/23/2023 1552   WBC 4.4 04/04/2023 1957   RBC 4.41 05/23/2023 1552   RBC 3.46 (L) 04/04/2023 1957   HGB 13.1 05/23/2023 1552   HCT 38.8 05/23/2023 1552   PLT 355 05/23/2023 1552   MCV 88 05/23/2023 1552   MCH 29.7 05/23/2023 1552   MCH 32.4 04/04/2023 1957   MCHC 33.8 05/23/2023 1552   MCHC 35.4 04/04/2023 1957   RDW 12.6 05/23/2023 1552   LYMPHSABS 1.4 10/02/2021 2043   MONOABS 0.5 10/02/2021 2043   EOSABS 0.1 10/02/2021 2043   BASOSABS 0.0 10/02/2021 2043    Lab Results  Component Value Date   HGBA1C 4.8 05/23/2023    Assessment & Plan:      Ringworm Recent onset of skin lesions, possibly from exposure to cats. Lesions are consistent with ringworm. -Prescribe antifungal cream. Apply to affected areas as directed.  Hematuria Recent episode of cloudy urine with large blood noted two weeks ago at urgent care. No current symptoms of urinary tract infection or kidney stones. -Repeat urine test today is negative for UTI but reveals trace blood which is an improvement from moderate from last set of labs.  Will continue to monitor.  Asthma Triggered by cold air from air conditioning. Currently on Flovent and Albuterol. -Continue current medications. Advise to avoid cold air triggers when possible.  Cervical Cancer Screening Last Pap smear in July 2023 was normal. Patient due for HPV vaccine. -Discuss HPV vaccine benefits and risks.  She is undecided.  Sexually Transmitted Disease Screening Swab test done today. -Await results and communicate to patient via MyChart.          No orders of the defined types were placed in this encounter.   Follow-up: Return if symptoms worsen or fail to improve.       Hoy Register, MD,  FAAFP. [facility] [place], [place] [phone]   09/04/2023, 10:52 AM

## 2023-09-05 ENCOUNTER — Other Ambulatory Visit: Payer: Self-pay | Admitting: Family Medicine

## 2023-09-05 LAB — CERVICOVAGINAL ANCILLARY ONLY
Bacterial Vaginitis (gardnerella): NEGATIVE
Candida Glabrata: NEGATIVE
Candida Vaginitis: NEGATIVE
Chlamydia: NEGATIVE
Comment: NEGATIVE
Comment: NEGATIVE
Comment: NEGATIVE
Comment: NEGATIVE
Comment: NEGATIVE
Comment: NORMAL
Neisseria Gonorrhea: NEGATIVE
Trichomonas: NEGATIVE

## 2023-09-06 ENCOUNTER — Encounter: Payer: Self-pay | Admitting: Family Medicine

## 2023-09-12 ENCOUNTER — Encounter (HOSPITAL_COMMUNITY): Payer: Self-pay

## 2023-09-12 ENCOUNTER — Ambulatory Visit (HOSPITAL_COMMUNITY)
Admission: RE | Admit: 2023-09-12 | Discharge: 2023-09-12 | Disposition: A | Discharge status: Home / Self Care | Payer: Medicaid Other | Source: Ambulatory Visit | Attending: Family Medicine | Admitting: Family Medicine

## 2023-09-12 VITALS — BP 115/76 | HR 103 | Temp 98.0°F | Resp 16

## 2023-09-12 DIAGNOSIS — Z113 Encounter for screening for infections with a predominantly sexual mode of transmission: Secondary | ICD-10-CM

## 2023-09-12 DIAGNOSIS — R300 Dysuria: Secondary | ICD-10-CM | POA: Diagnosis not present

## 2023-09-12 DIAGNOSIS — N898 Other specified noninflammatory disorders of vagina: Secondary | ICD-10-CM | POA: Diagnosis not present

## 2023-09-12 LAB — POCT URINALYSIS DIP (MANUAL ENTRY)
Bilirubin, UA: NEGATIVE
Glucose, UA: NEGATIVE mg/dL
Ketones, POC UA: NEGATIVE mg/dL
Leukocytes, UA: NEGATIVE
Nitrite, UA: NEGATIVE
Protein Ur, POC: NEGATIVE mg/dL
Spec Grav, UA: 1.025 (ref 1.010–1.025)
Urobilinogen, UA: 0.2 U/dL
pH, UA: 5 (ref 5.0–8.0)

## 2023-09-12 MED ORDER — FLUCONAZOLE 150 MG PO TABS: ORAL_TABLET | ORAL | 0 refills | Status: DC

## 2023-09-12 NOTE — ED Triage Notes (Signed)
Pt presents to office for STD. Pt has unprotected sex last night.

## 2023-09-12 NOTE — ED Provider Notes (Signed)
[facility]   [id number] 09/12/23 Arrival Time: 1404  ASSESSMENT & PLAN:  1. Vaginal irritation   2. Screening for STDs (sexually transmitted diseases)   3. Dysuria      Discharge Instructions      We have sent testing for sexually transmitted infections and a urine culture. We will notify you of any positive results once they are received. If required, we will prescribe any medications you might need.  Please refrain from all sexual activity for at least the next seven days.     Without s/s of PID.  Desires empiric tx for possible yeast infection. Meds ordered this encounter  Medications   fluconazole (DIFLUCAN) 150 MG tablet    Sig: Take one tablet by mouth as a single dose. May repeat in 3 days if symptoms persist.    Dispense:  2 tablet    Refill:  0   Labs Reviewed  POCT URINALYSIS DIP (MANUAL ENTRY) - Abnormal; Notable for the following components:      Result Value   Clarity, UA cloudy (*)    Blood, UA trace-intact (*)    All other components within normal limits  URINE CULTURE  CERVICOVAGINAL ANCILLARY ONLY   Will notify of any positive results. Instructed to refrain from sexual activity for at least seven days.  Reviewed expectations re: course of current medical issues. Questions answered. Outlined signs and symptoms indicating need for more acute intervention. Patient verbalized understanding. After Visit Summary given.   SUBJECTIVE:  Cassie Nguyen is a 27 y.o. female who presents with complaint of vaginal irritation. Desires STD testing. New female sexual partner recently. Worried she may also have yeast infection. Scant white vaginal discharge. Denies fever/n/v/abd or pelvic pain. Mild dysuria today.  Patient's last menstrual period was 08/01/2023 (approximate).   OBJECTIVE:  Vitals:   09/12/23 1419  BP: 115/76  Pulse: (!) 103  Resp: 16  Temp: 98 F (36.7 C)  TempSrc: Oral  SpO2: 98%    General appearance: alert,  cooperative, appears stated age and no distress Lungs: unlabored respirations; speaks full sentences without difficulty Back: no CVA tenderness; FROM at waist Abdomen: soft, non-tender GU: deferred Skin: warm and dry Psychological: alert and cooperative; normal mood and affect.  Results for orders placed or performed during the hospital encounter of 09/12/23  POC urinalysis dipstick  Result Value Ref Range   Color, UA yellow yellow   Clarity, UA cloudy (A) clear   Glucose, UA negative negative mg/dL   Bilirubin, UA negative negative   Ketones, POC UA negative negative mg/dL   Spec Grav, UA 0.865 7.846 - 1.025   Blood, UA trace-intact (A) negative   pH, UA 5.0 5.0 - 8.0   Protein Ur, POC negative negative mg/dL   Urobilinogen, UA 0.2 0.2 or 1.0 E.U./dL   Nitrite, UA Negative Negative   Leukocytes, UA Negative Negative    Labs Reviewed  POCT URINALYSIS DIP (MANUAL ENTRY) - Abnormal; Notable for the following components:      Result Value   Clarity, UA cloudy (*)    Blood, UA trace-intact (*)    All other components within normal limits  URINE CULTURE  CERVICOVAGINAL ANCILLARY ONLY    Allergies  Allergen Reactions   Peanut-Containing Drug Products Anaphylaxis   Shrimp [Shellfish Allergy] Anaphylaxis    Makes throat itchy and dry   Watermelon Flavor Anaphylaxis   Lactase Other (See Comments)    Constipation, gi upset    Past Medical History:  Diagnosis  Date   Asthma    Chlamydia    Gonorrhea    Trichomonas infection    Family History  Problem Relation Age of Onset   Cancer Mother    Social History   Socioeconomic History   Marital status: Single    Spouse name: Not on file   Number of children: Not on file   Years of education: Not on file   Highest education level: Not on file  Occupational History   Not on file  Tobacco Use   Smoking status: Never   Smokeless tobacco: Never  Vaping Use   Vaping status: Never Used  Substance and Sexual Activity    Alcohol use: Yes    Comment: occasionally   Drug use: Not Currently    Types: Marijuana   Sexual activity: Yes    Birth control/protection: Condom  Other Topics Concern   Not on file  Social History Narrative   Not on file   Social Determinants of Health   Financial Resource Strain: Not on file  Food Insecurity: No Food Insecurity (05/02/2021)   Hunger Vital Sign    Worried About Running Out of Food in the Last Year: Never true    Ran Out of Food in the Last Year: Never true  Transportation Needs: No Transportation Needs (05/02/2021)   PRAPARE - Administrator, Civil Service (Medical): No    Lack of Transportation (Non-Medical): No  Physical Activity: Not on file  Stress: Not on file  Social Connections: Not on file  Intimate Partner Violence: Not on file           Mardella Layman, MD 09/12/23 1525

## 2023-09-12 NOTE — Discharge Instructions (Signed)
We have sent testing for sexually transmitted infections and a urine culture. We will notify you of any positive results once they are received. If required, we will prescribe any medications you might need.  Please refrain from all sexual activity for at least the next seven days.

## 2023-09-13 LAB — CERVICOVAGINAL ANCILLARY ONLY
Bacterial Vaginitis (gardnerella): NEGATIVE
Candida Glabrata: NEGATIVE
Candida Vaginitis: NEGATIVE
Chlamydia: NEGATIVE
Comment: NEGATIVE
Comment: NEGATIVE
Comment: NEGATIVE
Comment: NEGATIVE
Comment: NEGATIVE
Comment: NORMAL
Neisseria Gonorrhea: NEGATIVE
Trichomonas: NEGATIVE

## 2023-09-13 LAB — URINE CULTURE: Culture: NO GROWTH

## 2023-09-18 ENCOUNTER — Ambulatory Visit (HOSPITAL_COMMUNITY)
Admission: EM | Admit: 2023-09-18 | Discharge: 2023-09-18 | Disposition: A | Discharge status: Home / Self Care | Payer: Medicaid Other | Attending: Family Medicine | Admitting: Family Medicine

## 2023-09-18 ENCOUNTER — Encounter (HOSPITAL_COMMUNITY): Payer: Self-pay

## 2023-09-18 DIAGNOSIS — J45901 Unspecified asthma with (acute) exacerbation: Secondary | ICD-10-CM | POA: Diagnosis not present

## 2023-09-18 MED ORDER — PREDNISONE 20 MG PO TABS: 20.0000 mg | ORAL_TABLET | Freq: Every day | ORAL | 0 refills | Status: AC

## 2023-09-18 MED ORDER — ALBUTEROL SULFATE HFA 108 (90 BASE) MCG/ACT IN AERS: 2.0000 | INHALATION_SPRAY | RESPIRATORY_TRACT | Status: AC | PRN

## 2023-09-18 MED ORDER — ALBUTEROL SULFATE HFA 108 (90 BASE) MCG/ACT IN AERS
INHALATION_SPRAY | RESPIRATORY_TRACT | Status: AC
  Filled 2023-09-18: qty 6.7

## 2023-09-18 MED ORDER — ALBUTEROL SULFATE HFA 108 (90 BASE) MCG/ACT IN AERS
2.0000 | INHALATION_SPRAY | Freq: Four times a day (QID) | RESPIRATORY_TRACT | Status: DC | PRN
  Administered 2023-09-18: 2 via RESPIRATORY_TRACT

## 2023-09-18 NOTE — ED Triage Notes (Signed)
Pt c/o wheezing and coughing since yesterday. States used her inhaler yesterday with no relief. NAD. Speaking in complete sentences.

## 2023-09-18 NOTE — ED Provider Notes (Signed)
[facility]    CSN: [id number] Arrival date & time: 09/18/23  1846      History   Chief Complaint Chief Complaint  Patient presents with   Wheezing    HPI Cassie Nguyen is a 27 y.o. female.    Wheezing Patient with a history of asthma presents today for evaluation of wheezing.  Reports that her asthma has recently been uncontrolled and she is without her inhaler. She endorses non productive cough. She denies fever, nausea, or vomiting. She is requesting a work note.  Past Medical History:  Diagnosis Date   Asthma    Chlamydia    Gonorrhea    Trichomonas infection     Patient Active Problem List   Diagnosis Date Noted   Irregular periods/menstrual cycles 03/17/2023   Acute otalgia, right 03/17/2023   Possible exposure to STD 03/17/2023   Marijuana use 03/17/2023   Elevated liver enzymes 02/08/2023   Right upper quadrant abdominal pain 11/09/2022   High risk heterosexual behavior 11/09/2022   Abnormal vaginal bleeding 11/09/2022   ASC-H Pap smear 05/02/2021   Trichimoniasis 05/02/2021   Cervical dysplasia 07/27/2019   Abnormal uterine bleeding (AUB) 07/20/2019   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 Authorizing Provider  albuterol (VENTOLIN HFA) 108 (90 Base) MCG/ACT inhaler Inhale 2 puffs into the lungs every 4 (four) hours as needed for wheezing or shortness of breath. 09/18/23  Yes Bing Neighbors, NP  predniSONE (DELTASONE) 20 MG tablet Take 1 tablet (20 mg total) by mouth daily with breakfast for 5 days. 09/18/23 09/23/23 Yes Bing Neighbors, NP   famotidine (PEPCID) 20 MG tablet Take 1 tablet (20 mg total) by mouth 2 (two) times daily. 08/05/23   Immordino, Jeannett Senior, FNP  FLOVENT HFA 110 MCG/ACT inhaler Inhale 1 puff into the lungs in the morning and at bedtime. 08/05/23   Immordino, Jeannett Senior, FNP  ibuprofen (ADVIL) 800 MG tablet Take 1 tablet (800 mg total) by mouth 3 (three) times daily. 04/09/23   Valinda Hoar, NP  medroxyPROGESTERone (PROVERA) 10 MG tablet Take 1 tablet (10 mg total) by mouth daily. Patient not taking: Reported on 07/20/2019 06/25/19 02/26/20  Merrilee Jansky, MD    Family History Family History  Problem Relation Age of Onset   Cancer Mother     Social History Social History   Tobacco Use   Smoking status: Never   Smokeless tobacco: Never  Vaping Use   Vaping status: Never Used  Substance Use Topics   Alcohol use: Yes    Comment: occasionally   Drug use: Not Currently    Types: Marijuana     Allergies   Peanut-containing drug products, Shrimp [shellfish allergy], Watermelon flavor, and Lactase   Review of Systems Review of Systems  Respiratory:  Positive for wheezing.      Physical  Exam Triage Vital Signs ED Triage Vitals [09/18/23 1936]  Encounter Vitals Group     BP 116/64     Systolic BP Percentile      Diastolic BP Percentile      Pulse Rate 80     Resp 18     Temp 98.6 F (37 C)     Temp Source Oral     SpO2 98 %     Weight      Height      Head Circumference      Peak Flow      Pain Score 0     Pain Loc      Pain Education      Exclude from Growth Chart    No data found.  Updated Vital Signs BP 116/64 (BP Location: Right Arm)   Pulse 80   Temp 98.6 F (37 C) (Oral)   Resp 18   LMP 09/16/2023 (Approximate)   SpO2 98%   Visual Acuity Right Eye Distance:   Left Eye Distance:   Bilateral Distance:    Right Eye Near:   Left Eye Near:    Bilateral Near:     Physical Exam Vitals reviewed.  Constitutional:      Appearance: Normal appearance.  HENT:      Head: Normocephalic and atraumatic.     Right Ear: Tympanic membrane, ear canal and external ear normal.     Left Ear: Tympanic membrane, ear canal and external ear normal.     Nose: Nose normal.  Eyes:     Extraocular Movements: Extraocular movements intact.     Conjunctiva/sclera: Conjunctivae normal.     Pupils: Pupils are equal, round, and reactive to light.  Cardiovascular:     Rate and Rhythm: Normal rate and regular rhythm.  Pulmonary:     Effort: Pulmonary effort is normal.     Breath sounds: Normal breath sounds.  Musculoskeletal:     Cervical back: Normal range of motion and neck supple.  Lymphadenopathy:     Cervical: No cervical adenopathy.  Skin:    General: Skin is warm and dry.  Neurological:     General: No focal deficit present.     Mental Status: She is alert and oriented to person, place, and time.      UC Treatments / Results  Labs (all labs ordered are listed, but only abnormal results are displayed) Labs Reviewed - No data to display  EKG   Radiology No results found.  Procedures Procedures (including critical care time)  Medications Ordered in UC Medications - No data to display   Initial Impression / Assessment and Plan / UC Course  I have reviewed the triage vital signs and the nursing notes.  Pertinent labs & imaging results that were available during my care of the patient were reviewed by me and considered in my medical decision making (see chart for details).    Asthma exacerbation, prednisone 20 mg daily x 5 days. Albuterol inhaler PRN for wheezing and shortness of breath. Work note provided. Return as needed. Final Clinical Impressions(s) / UC Diagnoses   Final diagnoses:  Exacerbation of asthma, unspecified asthma severity, unspecified whether persistent   Discharge Instructions   None    ED Prescriptions     Medication Sig Dispense Auth. Provider   predniSONE (DELTASONE) 20 MG tablet Take 1 tablet (20 mg total) by mouth  daily with breakfast for 5 days. 5 tablet Bing Neighbors, NP   albuterol (VENTOLIN HFA)  108 (90 Base) MCG/ACT inhaler Inhale 2 puffs into the lungs every 4 (four) hours as needed for wheezing or shortness of breath. -- Bing Neighbors, NP      PDMP not reviewed this encounter.   Bing Neighbors, NP 09/22/23 1214

## 2023-09-25 ENCOUNTER — Ambulatory Visit (HOSPITAL_COMMUNITY): Payer: Self-pay

## 2023-09-26 ENCOUNTER — Encounter (HOSPITAL_COMMUNITY): Payer: Self-pay

## 2023-09-26 ENCOUNTER — Ambulatory Visit (HOSPITAL_COMMUNITY)
Admission: RE | Admit: 2023-09-26 | Discharge: 2023-09-26 | Disposition: A | Discharge status: Home / Self Care | Payer: Medicaid Other | Source: Ambulatory Visit | Attending: Family Medicine | Admitting: Family Medicine

## 2023-09-26 VITALS — BP 130/86 | HR 78 | Temp 98.1°F | Resp 14

## 2023-09-26 DIAGNOSIS — R82998 Other abnormal findings in urine: Secondary | ICD-10-CM | POA: Diagnosis not present

## 2023-09-26 DIAGNOSIS — Z113 Encounter for screening for infections with a predominantly sexual mode of transmission: Secondary | ICD-10-CM | POA: Insufficient documentation

## 2023-09-26 LAB — POCT URINALYSIS DIP (MANUAL ENTRY)
Bilirubin, UA: NEGATIVE
Blood, UA: NEGATIVE
Glucose, UA: NEGATIVE mg/dL
Ketones, POC UA: NEGATIVE mg/dL
Nitrite, UA: NEGATIVE
Protein Ur, POC: NEGATIVE mg/dL
Spec Grav, UA: 1.02 (ref 1.010–1.025)
Urobilinogen, UA: 0.2 E.U./dL
pH, UA: 5.5 (ref 5.0–8.0)

## 2023-09-26 LAB — CERVICOVAGINAL ANCILLARY ONLY
Bacterial Vaginitis (gardnerella): NEGATIVE
Candida Glabrata: NEGATIVE
Candida Vaginitis: NEGATIVE
Chlamydia: NEGATIVE
Comment: NEGATIVE
Comment: NEGATIVE
Comment: NEGATIVE
Comment: NEGATIVE
Comment: NEGATIVE
Comment: NORMAL
Neisseria Gonorrhea: NEGATIVE
Trichomonas: NEGATIVE

## 2023-09-26 NOTE — Discharge Instructions (Signed)
You have had labs (STI testing and an urine culture) sent today. We will call you with any significant abnormalities or if there is need to begin or change treatment or pursue further follow up.  You may also review your test results online through MyChart. If you do not have a MyChart account, instructions to sign up should be on your discharge paperwork.

## 2023-09-26 NOTE — ED Provider Notes (Signed)
[facility]   [id number] 09/26/23 Arrival Time: 1102  ASSESSMENT & PLAN:  1. Screening for STDs (sexually transmitted diseases)   2. Urine leukocytes    Urine culture and vaginal cytology pending.    Discharge Instructions      You have had labs (STI testing and an urine culture) sent today. We will call you with any significant abnormalities or if there is need to begin or change treatment or pursue further follow up.  You may also review your test results online through MyChart. If you do not have a MyChart account, instructions to sign up should be on your discharge paperwork.     Without s/s of PID.  Labs Reviewed  POCT URINALYSIS DIP (MANUAL ENTRY) - Abnormal; Notable for the following components:      Result Value   Clarity, UA cloudy (*)    Leukocytes, UA Trace (*)    All other components within normal limits  URINE CULTURE  CERVICOVAGINAL ANCILLARY ONLY    Will notify of any positive results. Instructed to refrain from sexual activity for at least seven days.  Reviewed expectations re: course of current medical issues. Questions answered. Outlined signs and symptoms indicating need for more acute intervention. Patient verbalized understanding. After Visit Summary given.   SUBJECTIVE:  Cassie Nguyen is a 27 y.o. female who requests STD testing. Patient states she wants to abe tested for STD's and wants a a urinalysis because she had a previous test that stated "cloudy".  Patient also stated that she wants the provider to do the STD test because she wants to make sure it is done right.  Questions slight urinary frequency.  Patient's last menstrual period was 09/16/2023 (approximate).   OBJECTIVE:  Vitals:   09/26/23 1130  BP: 130/86  Pulse: 78  Resp: 14  Temp: 98.1 F (36.7 C)  TempSrc: Oral  SpO2: 97%     General appearance: alert, cooperative, appears stated age and no distress Lungs: unlabored respirations; speaks full  sentences without difficulty GU: normal appearing external genitalia (female CMA chaperone present) Skin: warm and dry Psychological: alert and cooperative; normal mood and affect.  Results for orders placed or performed during the hospital encounter of 09/26/23  POC urinalysis dipstick  Result Value Ref Range   Color, UA yellow yellow   Clarity, UA cloudy (A) clear   Glucose, UA negative negative mg/dL   Bilirubin, UA negative negative   Ketones, POC UA negative negative mg/dL   Spec Grav, UA 5.638 7.564 - 1.025   Blood, UA negative negative   pH, UA 5.5 5.0 - 8.0   Protein Ur, POC negative negative mg/dL   Urobilinogen, UA 0.2 0.2 or 1.0 E.U./dL   Nitrite, UA Negative Negative   Leukocytes, UA Trace (A) Negative    Labs Reviewed  POCT URINALYSIS DIP (MANUAL ENTRY) - Abnormal; Notable for the following components:      Result Value   Clarity, UA cloudy (*)    Leukocytes, UA Trace (*)    All other components within normal limits  URINE CULTURE  CERVICOVAGINAL ANCILLARY ONLY    Allergies  Allergen Reactions   Peanut-Containing Drug Products Anaphylaxis   Shrimp [Shellfish Allergy] Anaphylaxis    Makes throat itchy and dry   Watermelon Flavor Anaphylaxis   Lactase Other (See Comments)    Constipation, gi upset    Past Medical History:  Diagnosis Date   Asthma    Chlamydia    Gonorrhea    Trichomonas infection  Family History  Problem Relation Age of Onset   Cancer Mother    Social History   Socioeconomic History   Marital status: Single    Spouse name: Not on file   Number of children: Not on file   Years of education: Not on file   Highest education level: Not on file  Occupational History   Not on file  Tobacco Use   Smoking status: Never   Smokeless tobacco: Never  Vaping Use   Vaping status: Never Used  Substance and Sexual Activity   Alcohol use: Yes    Comment: occasionally   Drug use: Yes    Types: Marijuana   Sexual activity: Yes     Birth control/protection: Condom  Other Topics Concern   Not on file  Social History Narrative   Not on file   Social Determinants of Health   Financial Resource Strain: Not on file  Food Insecurity: No Food Insecurity (05/02/2021)   Hunger Vital Sign    Worried About Running Out of Food in the Last Year: Never true    Ran Out of Food in the Last Year: Never true  Transportation Needs: No Transportation Needs (05/02/2021)   PRAPARE - Administrator, Civil Service (Medical): No    Lack of Transportation (Non-Medical): No  Physical Activity: Not on file  Stress: Not on file  Social Connections: Not on file  Intimate Partner Violence: Not on file           Mardella Layman, MD 09/26/23 1407

## 2023-09-26 NOTE — ED Triage Notes (Signed)
Patient states she wants to abe tested for STD's and wants a a urinalysis because she had a previous test that stated "cloudy".  Patient also stated that she wants the provider to do the STD test because she wants to make sure it is done right.

## 2023-09-28 LAB — URINE CULTURE: Culture: <10000 — AB

## 2023-09-30 ENCOUNTER — Telehealth (HOSPITAL_COMMUNITY): Payer: Self-pay

## 2023-10-10 ENCOUNTER — Ambulatory Visit (HOSPITAL_COMMUNITY)
Admission: RE | Admit: 2023-10-10 | Discharge: 2023-10-10 | Discharge status: Against Medical Advice | Payer: Medicaid Other | Source: Ambulatory Visit | Attending: Family Medicine | Admitting: Family Medicine

## 2023-11-26 ENCOUNTER — Encounter (HOSPITAL_COMMUNITY): Payer: Self-pay

## 2023-11-26 ENCOUNTER — Ambulatory Visit (HOSPITAL_COMMUNITY)
Admission: RE | Admit: 2023-11-26 | Discharge: 2023-11-26 | Disposition: A | Discharge status: Home / Self Care | Payer: Medicaid Other | Source: Ambulatory Visit | Attending: Nurse Practitioner | Admitting: Nurse Practitioner

## 2023-11-26 VITALS — BP 114/81 | HR 80 | Temp 97.9°F | Resp 16

## 2023-11-26 DIAGNOSIS — Z113 Encounter for screening for infections with a predominantly sexual mode of transmission: Secondary | ICD-10-CM | POA: Diagnosis not present

## 2023-11-26 LAB — POCT URINALYSIS DIP (MANUAL ENTRY)
Bilirubin, UA: NEGATIVE
Blood, UA: NEGATIVE
Glucose, UA: NEGATIVE mg/dL
Ketones, POC UA: NEGATIVE mg/dL
Leukocytes, UA: NEGATIVE
Nitrite, UA: NEGATIVE
Protein Ur, POC: NEGATIVE mg/dL
Spec Grav, UA: 1.02 (ref 1.010–1.025)
Urobilinogen, UA: 0.2 U/dL
pH, UA: 6.5 (ref 5.0–8.0)

## 2023-11-26 NOTE — Discharge Instructions (Signed)
Urine sample today looks great.  Will contact you with any abnormal results from the vaginal testing today.

## 2023-11-26 NOTE — ED Notes (Signed)
Called pt in waiting room no answer

## 2023-11-26 NOTE — ED Notes (Addendum)
Called pt to room No answer X 2, Jasmine December at front desk states she went outside to call pt and she didn't answer.

## 2023-11-26 NOTE — ED Notes (Signed)
Pt called from lobby no answer. X3

## 2023-11-26 NOTE — ED Triage Notes (Addendum)
Pt denies an sx states she just wants an STI check.  Pt only wants cyto no blood work  She would like UA done since last time she had blood in her urine.

## 2023-11-26 NOTE — ED Provider Notes (Signed)
[facility]    CSN: [id number] Arrival date & time: 11/26/23  1659      History   Chief Complaint Chief Complaint  Patient presents with   SEXUALLY TRANSMITTED DISEASE    HPI Cassie Nguyen is a 27 y.o. female.   Patient presents today for STI testing.  She denies any symptoms including no vaginal discharge, abdominal pain, vaginal rashes, sores, or lesions, nausea/vomiting, or fevers.  No known exposures to STI.  She is also requesting a urinalysis because last time she had it checked, there is blood in her urine.  No dysuria, urinary frequency or urgency.  No foul urinary odor, flank pain.    Past Medical History:  Diagnosis Date   Asthma    Chlamydia    Gonorrhea    Trichomonas infection     Patient Active Problem List   Diagnosis Date Noted   Irregular periods/menstrual cycles 03/17/2023   Acute otalgia, right 03/17/2023   Possible exposure to STD 03/17/2023   Marijuana use 03/17/2023   Elevated liver enzymes 02/08/2023   Right upper quadrant abdominal pain 11/09/2022   High risk heterosexual behavior 11/09/2022   Abnormal vaginal bleeding 11/09/2022   ASC-H Pap smear 05/02/2021   Trichomoniasis 05/02/2021   Cervical dysplasia 07/27/2019   Abnormal uterine bleeding (AUB) 07/20/2019   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 Authorizing Provider  albuterol (VENTOLIN HFA) 108 (90 Base) MCG/ACT inhaler Inhale 2 puffs into the lungs every 4 (four) hours as needed for wheezing or shortness of breath. 09/18/23   Bing Neighbors, NP  famotidine (PEPCID) 20 MG tablet  Take 1 tablet (20 mg total) by mouth 2 (two) times daily. 08/05/23   Immordino, Jeannett Senior, FNP  FLOVENT HFA 110 MCG/ACT inhaler Inhale 1 puff into the lungs in the morning and at bedtime. 08/05/23   Immordino, Jeannett Senior, FNP  ibuprofen (ADVIL) 800 MG tablet Take 1 tablet (800 mg total) by mouth 3 (three) times daily. 04/09/23   Valinda Hoar, NP  medroxyPROGESTERone (PROVERA) 10 MG tablet Take 1 tablet (10 mg total) by mouth daily. Patient not taking: Reported on 07/20/2019 06/25/19 02/26/20  Merrilee Jansky, MD    Family History Family History  Problem Relation Age of Onset   Cancer Mother     Social History Social History   Tobacco Use   Smoking status: Never   Smokeless tobacco: Never  Vaping Use   Vaping status: Never Used  Substance Use Topics   Alcohol use: Yes    Comment: occasionally   Drug use: Yes    Types: Marijuana     Allergies   Peanut-containing drug products, Shrimp [shellfish allergy], Watermelon flavor, and Lactase   Review of Systems Review of Systems Per HPI  Physical Exam Triage Vital Signs ED Triage Vitals  Encounter Vitals Group  BP 11/26/23 1910 114/81     Systolic BP Percentile --      Diastolic BP Percentile --      Pulse Rate 11/26/23 1910 80     Resp 11/26/23 1910 16     Temp 11/26/23 1910 97.9 F (36.6 C)     Temp Source 11/26/23 1910 Oral     SpO2 11/26/23 1910 98 %     Weight --      Height --      Head Circumference --      Peak Flow --      Pain Score 11/26/23 1909 0     Pain Loc --      Pain Education --      Exclude from Growth Chart --    No data found.  Updated Vital Signs BP 114/81 (BP Location: Right Arm)   Pulse 80   Temp 97.9 F (36.6 C) (Oral)   Resp 16   LMP 11/18/2023 (Exact Date)   SpO2 98%   Visual Acuity Right Eye Distance:   Left Eye Distance:   Bilateral Distance:    Right Eye Near:   Left Eye Near:    Bilateral Near:     Physical Exam Vitals and nursing note reviewed.  Constitutional:       General: She is not in acute distress.    Appearance: Normal appearance. She is not toxic-appearing.  Pulmonary:     Effort: Pulmonary effort is normal. No respiratory distress.  Genitourinary:    Comments: Deferred - self swab performed by patient Skin:    General: Skin is warm and dry.     Coloration: Skin is not jaundiced or pale.     Findings: No erythema.  Neurological:     Mental Status: She is alert and oriented to person, place, and time.     Motor: No weakness.     Gait: Gait normal.  Psychiatric:        Mood and Affect: Mood normal.        Behavior: Behavior is cooperative.      UC Treatments / Results  Labs (all labs ordered are listed, but only abnormal results are displayed) Labs Reviewed  POCT URINALYSIS DIP (MANUAL ENTRY)  CERVICOVAGINAL ANCILLARY ONLY    EKG   Radiology No results found.  Procedures Procedures (including critical care time)  Medications Ordered in UC Medications - No data to display  Initial Impression / Assessment and Plan / UC Course  I have reviewed the triage vital signs and the nursing notes.  Pertinent labs & imaging results that were available during my care of the patient were reviewed by me and considered in my medical decision making (see chart for details).   Patient is well-appearing, normotensive, afebrile, not tachycardic, not tachypneic, oxygenating well on room air.    1. Screen for STD (sexually transmitted disease) Vaginal cytology is pending-treat as indicated Urinalysis unremarkable Patient declines HIV and syphilis testing today  The patient was given the opportunity to ask questions.  All questions answered to their satisfaction.  The patient is in agreement to this plan.    Final Clinical Impressions(s) / UC Diagnoses   Final diagnoses:  Screen for STD (sexually transmitted disease)   Discharge Instructions   None    ED Prescriptions   None    PDMP not reviewed this encounter.   Valentino Nose, NP 11/26/23 [phone]

## 2023-11-27 LAB — CERVICOVAGINAL ANCILLARY ONLY
Chlamydia: NEGATIVE
Comment: NEGATIVE
Comment: NEGATIVE
Comment: NORMAL
Neisseria Gonorrhea: NEGATIVE
Trichomonas: NEGATIVE

## 2023-12-29 ENCOUNTER — Ambulatory Visit (HOSPITAL_COMMUNITY)
Admission: RE | Admit: 2023-12-29 | Discharge: 2023-12-29 | Disposition: A | Discharge status: Home / Self Care | Payer: Medicaid Other | Source: Ambulatory Visit | Attending: Family Medicine | Admitting: Family Medicine

## 2023-12-29 ENCOUNTER — Encounter (HOSPITAL_COMMUNITY): Payer: Self-pay

## 2023-12-29 VITALS — BP 100/67 | HR 94 | Temp 98.4°F | Resp 18

## 2023-12-29 DIAGNOSIS — N760 Acute vaginitis: Secondary | ICD-10-CM | POA: Diagnosis not present

## 2023-12-29 MED ORDER — FLUCONAZOLE 150 MG PO TABS: 150.0000 mg | ORAL_TABLET | Freq: Once | ORAL | 0 refills | Status: AC

## 2023-12-29 NOTE — ED Notes (Signed)
Patient left prior to getting written instructions

## 2023-12-29 NOTE — ED Triage Notes (Signed)
Patient presents to the office for follow up cyto swab. Patient states she was not check for yeast infection.

## 2023-12-29 NOTE — ED Provider Notes (Signed)
[facility]    CSN: [id number] Arrival date & time: 12/29/23  1701      History   Chief Complaint Chief Complaint  Patient presents with   Follow-up    Entered by patient    HPI Cassie Nguyen is a 27 y.o. female.   The history is provided by the patient. No language interpreter was used.  Vaginal Itching This is a new (Started 2 days ago after getting off her period. She uses Tampons or pads for her period) problem. Pertinent negatives include no abdominal pain. Associated symptoms comments: Sometimes have belly pain on and off. LMP - 11/22/23 She is not sexually active but would like to get checked for STDs and retested for BV and yeast. She denies vulva lesions, but the area feels dry. . Exacerbated by: Symptoms is associated with her period. Nothing relieves the symptoms. She has tried nothing for the symptoms.    Past Medical History:  Diagnosis Date   Asthma    Chlamydia    Gonorrhea    Trichomonas infection     Patient Active Problem List   Diagnosis Date Noted   Irregular periods/menstrual cycles 03/17/2023   Acute otalgia, right 03/17/2023   Possible exposure to STD 03/17/2023   Marijuana use 03/17/2023   Elevated liver enzymes 02/08/2023   Right upper quadrant abdominal pain 11/09/2022   High risk heterosexual behavior 11/09/2022   Abnormal vaginal bleeding 11/09/2022   ASC-H Pap smear 05/02/2021   Trichomoniasis 05/02/2021   Cervical dysplasia 07/27/2019   Abnormal uterine bleeding (AUB) 07/20/2019   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 Authorizing  Provider  fluconazole (DIFLUCAN) 150 MG tablet Take 1 tablet (150 mg total) by mouth once for 1 dose. 12/29/23 12/29/23 Yes Doreene Eland, MD  albuterol (VENTOLIN HFA) 108 (90 Base) MCG/ACT inhaler Inhale 2 puffs into the lungs every 4 (four) hours as needed for wheezing or shortness of breath. 09/18/23   Bing Neighbors, NP  famotidine (PEPCID) 20 MG tablet Take 1 tablet (20 mg total) by mouth 2 (two) times daily. 08/05/23   Immordino, Jeannett Senior, FNP  FLOVENT HFA 110 MCG/ACT inhaler Inhale 1 puff into the lungs in the morning and at bedtime. 08/05/23   Immordino, Jeannett Senior, FNP  ibuprofen (ADVIL) 800 MG tablet Take 1 tablet (800 mg total) by mouth 3 (three) times daily. 04/09/23   Valinda Hoar, NP  medroxyPROGESTERone (PROVERA) 10 MG tablet Take 1 tablet (10 mg total) by mouth daily. Patient not taking: Reported on 07/20/2019 06/25/19 02/26/20  Merrilee Jansky, MD    Family History Family History  Problem Relation Age of Onset   Cancer Mother     Social History Social History   Tobacco Use   Smoking status: Never   Smokeless tobacco: Never  Vaping Use   Vaping status:  Never Used  Substance Use Topics   Alcohol use: Yes    Comment: occasionally   Drug use: Yes    Types: Marijuana     Allergies   Peanut-containing drug products, Shrimp [shellfish allergy], Watermelon flavoring agent (non-screening), and Lactase   Review of Systems Review of Systems  Gastrointestinal:  Negative for abdominal pain.  All other systems reviewed and are negative.    Physical Exam Triage Vital Signs ED Triage Vitals  Encounter Vitals Group     BP 12/29/23 1720 100/67     Systolic BP Percentile --      Diastolic BP Percentile --      Pulse Rate 12/29/23 1720 94     Resp 12/29/23 1720 18     Temp 12/29/23 1720 98.4 F (36.9 C)     Temp Source 12/29/23 1720 Oral     SpO2 12/29/23 1720 94 %     Weight --      Height --      Head Circumference --      Peak Flow --      Pain Score  12/29/23 1723 0     Pain Loc --      Pain Education --      Exclude from Growth Chart --    No data found.  Updated Vital Signs BP 100/67 (BP Location: Left Arm)   Pulse 94   Temp 98.4 F (36.9 C) (Oral)   Resp 18   LMP 12/22/2023 (Approximate)   SpO2 94%   Visual Acuity Right Eye Distance:   Left Eye Distance:   Bilateral Distance:    Right Eye Near:   Left Eye Near:    Bilateral Near:     Physical Exam Vitals and nursing note reviewed. Exam conducted with a chaperone present Selena Batten).  Cardiovascular:     Rate and Rhythm: Normal rate and regular rhythm.     Heart sounds: Normal heart sounds. No murmur heard. Pulmonary:     Effort: Pulmonary effort is normal. No respiratory distress.     Breath sounds: Normal breath sounds. No wheezing.  Genitourinary:    Exam position: Lithotomy position.     Pubic Area: No rash.      Labia:        Right: No rash, tenderness or lesion.        Left: No rash, tenderness or lesion.      Comments: Did not do speculum exam for comfort. She self swab for cervical testing     UC Treatments / Results  Labs (all labs ordered are listed, but only abnormal results are displayed) Labs Reviewed  HSV CULTURE AND TYPING  CERVICOVAGINAL ANCILLARY ONLY    EKG   Radiology No results found.  Procedures Procedures (including critical care time)  Medications Ordered in UC Medications - No data to display  Initial Impression / Assessment and Plan / UC Course  I have reviewed the triage vital signs and the nursing notes.  Pertinent labs & imaging results that were available during my care of the patient were reviewed by me and considered in my medical decision making (see chart for details).  Clinical Course as of 12/29/23 1752  Sun Dec 29, 2023  1750 Vulvo vaginitis I reviewed and discussed previous results with her, which were normal She requested retesting for BV, yeast, and STDs except for RPR and HIV HSV swab were also  collected Given her symptoms, I went ahead and treated her empirically for yeast infection  I will contact her soon with all results She agreed with the plan [KE]    Clinical Course User Index [KE] Doreene Eland, MD     Final Clinical Impressions(s) / UC Diagnoses   Final diagnoses:  Vaginitis and vulvovaginitis     Discharge Instructions      It was nice seeing you. You vaginal exam is normal. However, per your request and the symptoms you are having, we rechecked you for STDS as well as yeast. We also checked for herpes. I will call you soon with the result. In the meantime, we will treat you empirically for yeast infection with Diflucan. Follow up as needed     ED Prescriptions     Medication Sig Dispense Auth. Provider   fluconazole (DIFLUCAN) 150 MG tablet Take 1 tablet (150 mg total) by mouth once for 1 dose. 1 tablet Doreene Eland, MD      PDMP not reviewed this encounter.   Doreene Eland, MD 12/29/23 [phone]

## 2023-12-29 NOTE — Discharge Instructions (Signed)
It was nice seeing you. You vaginal exam is normal. However, per your request and the symptoms you are having, we rechecked you for STDS as well as yeast. We also checked for herpes. I will call you soon with the result. In the meantime, we will treat you empirically for yeast infection with Diflucan. Follow up as needed

## 2023-12-29 NOTE — ED Notes (Signed)
At bedside for Dr Lum Babe examination of patient.  Dr Lum Babe obtained HSV sampling from labia.  Labeled at bedside and placed in lab

## 2023-12-30 LAB — CERVICOVAGINAL ANCILLARY ONLY
Bacterial Vaginitis (gardnerella): NEGATIVE
Candida Glabrata: NEGATIVE
Candida Vaginitis: NEGATIVE
Chlamydia: NEGATIVE
Comment: NEGATIVE
Comment: NEGATIVE
Comment: NEGATIVE
Comment: NEGATIVE
Comment: NEGATIVE
Comment: NORMAL
Neisseria Gonorrhea: NEGATIVE
Trichomonas: NEGATIVE

## 2024-01-02 LAB — HSV CULTURE AND TYPING

## 2024-04-27 ENCOUNTER — Ambulatory Visit (HOSPITAL_COMMUNITY)
Admission: RE | Admit: 2024-04-27 | Discharge: 2024-04-27 | Disposition: A | Discharge status: Home / Self Care | Payer: Self-pay | Source: Ambulatory Visit | Attending: Emergency Medicine | Admitting: Emergency Medicine

## 2024-04-27 ENCOUNTER — Encounter (HOSPITAL_COMMUNITY): Payer: Self-pay

## 2024-04-27 VITALS — BP 108/72 | HR 106 | Temp 97.6°F | Resp 14

## 2024-04-27 DIAGNOSIS — Z113 Encounter for screening for infections with a predominantly sexual mode of transmission: Secondary | ICD-10-CM | POA: Diagnosis not present

## 2024-04-27 DIAGNOSIS — N949 Unspecified condition associated with female genital organs and menstrual cycle: Secondary | ICD-10-CM | POA: Insufficient documentation

## 2024-04-27 LAB — POCT URINALYSIS DIP (MANUAL ENTRY)
Bilirubin, UA: NEGATIVE
Glucose, UA: NEGATIVE mg/dL
Leukocytes, UA: NEGATIVE
Nitrite, UA: NEGATIVE
Protein Ur, POC: NEGATIVE mg/dL
Spec Grav, UA: 1.02 (ref 1.010–1.025)
Urobilinogen, UA: 0.2 U/dL
pH, UA: 5.5 (ref 5.0–8.0)

## 2024-04-27 LAB — POCT URINE PREGNANCY: Preg Test, Ur: NEGATIVE

## 2024-04-27 NOTE — Discharge Instructions (Addendum)
 Today you have been screened for some sexually transmitted infections.  Results will be available in MyChart and our staff will contact you if abnormal.  Abstain from intercourse until results have been received and notify any sexual partners of any positives.

## 2024-04-27 NOTE — ED Notes (Signed)
 Patient refused labwork.

## 2024-04-27 NOTE — ED Provider Notes (Signed)
 [facility]    CSN: [id number] Arrival date & time: 04/27/24  1903      History   Chief Complaint Chief Complaint  Patient presents with   Follow-up    Entered by patient    HPI Cassie Nguyen is a 28 y.o. female.   Patient presents to clinic requesting sexually-transmitted infection screening.  Reports she just feels different in her vaginal area.  She recently had unprotected sex around a week ago with an ex-boyfriend.  She also recently changed soaps.   Has not had any dysuria.  Has not had any abdominal pain, pelvic pain, vaginal discharge, odor or vaginal itching.  Declines HIV or syphilis screening.  The history is provided by the patient and medical records.    Past Medical History:  Diagnosis Date   Asthma    Chlamydia    Gonorrhea    Trichomonas infection     Patient Active Problem List   Diagnosis Date Noted   Irregular periods/menstrual cycles 03/17/2023   Acute otalgia, right 03/17/2023   Possible exposure to STD 03/17/2023   Marijuana use 03/17/2023   Elevated liver enzymes 02/08/2023   Right upper quadrant abdominal pain 11/09/2022   High risk heterosexual behavior 11/09/2022   Abnormal vaginal bleeding 11/09/2022   ASC-H Pap smear 05/02/2021   Trichomoniasis 05/02/2021   Cervical dysplasia 07/27/2019   Abnormal uterine bleeding (AUB) 07/20/2019   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 Authorizing Provider  albuterol  (VENTOLIN  HFA) 108 (90 Base) MCG/ACT inhaler Inhale 2 puffs into the lungs every 4 (four) hours as needed for wheezing or shortness  of breath. 09/18/23   Buena Carmine, NP  FLOVENT  HFA 110 MCG/ACT inhaler Inhale 1 puff into the lungs in the morning and at bedtime. 08/05/23   Immordino, Mara Seminole, FNP  medroxyPROGESTERone  (PROVERA ) 10 MG tablet Take 1 tablet (10 mg total) by mouth daily. Patient not taking: Reported on 07/20/2019 06/25/19 02/26/20  Corine Dice, MD    Family History Family History  Problem Relation Age of Onset   Cancer Mother     Social History Social History   Tobacco Use   Smoking status: Never   Smokeless tobacco: Never  Vaping Use   Vaping status: Never Used  Substance Use Topics   Alcohol use: Yes    Comment: occasionally   Drug use: Yes    Types: Marijuana     Allergies   Peanut-containing drug products, Shrimp [shellfish allergy], Watermelon flavoring agent (non-screening), and Lactase   Review of Systems Review of Systems  Per HPI  Physical Exam Triage Vital Signs ED Triage Vitals [04/27/24 1938]  Encounter Vitals Group     BP 108/72     Systolic BP Percentile      Diastolic BP Percentile      Pulse Rate (!) 106  Resp 14     Temp 97.6 F (36.4 C)     Temp Source Oral     SpO2 93 %     Weight      Height      Head Circumference      Peak Flow      Pain Score 0     Pain Loc      Pain Education      Exclude from Growth Chart    No data found.  Updated Vital Signs BP 108/72 (BP Location: Left Arm)   Pulse (!) 106   Temp 97.6 F (36.4 C) (Oral)   Resp 14   LMP 04/10/2024   SpO2 93%   Visual Acuity Right Eye Distance:   Left Eye Distance:   Bilateral Distance:    Right Eye Near:   Left Eye Near:    Bilateral Near:     Physical Exam Vitals and nursing note reviewed.  Constitutional:      Appearance: Normal appearance.  HENT:     Head: Normocephalic and atraumatic.     Right Ear: External ear normal.     Left Ear: External ear normal.     Nose: Nose normal.     Mouth/Throat:     Mouth: Mucous membranes are moist.  Eyes:      Conjunctiva/sclera: Conjunctivae normal.  Cardiovascular:     Rate and Rhythm: Normal rate.  Pulmonary:     Effort: Pulmonary effort is normal. No respiratory distress.  Skin:    General: Skin is warm and dry.  Neurological:     General: No focal deficit present.     Mental Status: She is alert.  Psychiatric:        Mood and Affect: Mood normal.      UC Treatments / Results  Labs (all labs ordered are listed, but only abnormal results are displayed) Labs Reviewed  POCT URINALYSIS DIP (MANUAL ENTRY) - Abnormal; Notable for the following components:      Result Value   Clarity, UA cloudy (*)    Ketones, POC UA trace (5) (*)    Blood, UA trace-lysed (*)    All other components within normal limits  POCT URINE PREGNANCY  CERVICOVAGINAL ANCILLARY ONLY    EKG   Radiology No results found.  Procedures Procedures (including critical care time)  Medications Ordered in UC Medications - No data to display  Initial Impression / Assessment and Plan / UC Course  I have reviewed the triage vital signs and the nursing notes.  Pertinent labs & imaging results that were available during my care of the patient were reviewed by me and considered in my medical decision making (see chart for details).  Vitals in triage reviewed, patient is hemodynamically stable.  Changes in the vaginal area, no specific symptoms.  Cytology swab obtained.  Urine pregnancy negative. Urinalysis shows trace red blood cells, asymptomatic, lower concern for UTI.  Staff will contact if treatment is needed based on cytology results.  Plan of care, follow-up care return precautions given, no questions at this time.     Final Clinical Impressions(s) / UC Diagnoses   Final diagnoses:  Screening examination for sexually transmitted disease  Vaginal discomfort     Discharge Instructions      Today you have been screened for some sexually transmitted infections.  Results will be available in MyChart  and our staff will contact you if abnormal.  Abstain from intercourse until results have been received and  notify any sexual partners of any positives.     ED Prescriptions   None    PDMP not reviewed this encounter.   Harlow Lighter, Kelsee Preslar  N, FNP 04/27/24 [phone]

## 2024-04-27 NOTE — ED Triage Notes (Signed)
 Patient states that she is wanting to  have an STD check. Patient states she "feels different in her vaginal are."

## 2024-04-28 LAB — CERVICOVAGINAL ANCILLARY ONLY
Bacterial Vaginitis (gardnerella): NEGATIVE
Candida Glabrata: NEGATIVE
Candida Vaginitis: POSITIVE — AB
Chlamydia: NEGATIVE
Comment: NEGATIVE
Comment: NEGATIVE
Comment: NEGATIVE
Comment: NEGATIVE
Comment: NEGATIVE
Comment: NORMAL
Neisseria Gonorrhea: NEGATIVE
Trichomonas: NEGATIVE

## 2024-04-29 ENCOUNTER — Telehealth (HOSPITAL_COMMUNITY): Payer: Self-pay

## 2024-04-29 MED ORDER — FLUCONAZOLE 150 MG PO TABS: 150.0000 mg | ORAL_TABLET | Freq: Once | ORAL | 0 refills | Status: AC

## 2024-04-29 NOTE — Telephone Encounter (Signed)
 Per protocol, pt requires tx with Diflucan.  Rx sent to pharmacy on file.

## 2024-06-04 ENCOUNTER — Ambulatory Visit (HOSPITAL_COMMUNITY)
Admission: RE | Admit: 2024-06-04 | Discharge: 2024-06-04 | Disposition: A | Discharge status: Home / Self Care | Source: Ambulatory Visit | Attending: Family Medicine | Admitting: Family Medicine

## 2024-06-04 ENCOUNTER — Encounter (HOSPITAL_COMMUNITY): Payer: Self-pay

## 2024-06-04 VITALS — BP 122/87 | HR 71 | Temp 98.2°F | Resp 16

## 2024-06-04 DIAGNOSIS — R319 Hematuria, unspecified: Secondary | ICD-10-CM | POA: Diagnosis not present

## 2024-06-04 DIAGNOSIS — R102 Pelvic and perineal pain: Secondary | ICD-10-CM | POA: Insufficient documentation

## 2024-06-04 LAB — POCT URINALYSIS DIP (MANUAL ENTRY)
Bilirubin, UA: NEGATIVE
Glucose, UA: NEGATIVE mg/dL
Ketones, POC UA: NEGATIVE mg/dL
Leukocytes, UA: NEGATIVE
Nitrite, UA: NEGATIVE
Protein Ur, POC: NEGATIVE mg/dL
Spec Grav, UA: 1.015 (ref 1.010–1.025)
Urobilinogen, UA: 0.2 U/dL
pH, UA: 7.5 (ref 5.0–8.0)

## 2024-06-04 LAB — POCT URINE PREGNANCY: Preg Test, Ur: NEGATIVE

## 2024-06-04 NOTE — Discharge Instructions (Signed)
 You have had labs (vaginal testing for infection and a urine culture) sent today. We will call you with any significant abnormalities or if there is need to begin or change treatment or pursue further follow up.  You may also review your test results online through MyChart. If you do not have a MyChart account, instructions to sign up should be on your discharge paperwork.

## 2024-06-04 NOTE — ED Triage Notes (Addendum)
 Pt states she wishes to get her urine checked "and do the swab to make sure the infection is gone" from 04/29/24. When informed that urine is not needed to check for yeast infection, she said she wanted to get checked since she's been having abd cramping. When asked onset of cramping, pt repeatedly states she doesn't know since she had been on her period. Pt states she did fill Diflucan  Rx and took both doses after last visit.  Pt also feels she may have had hematuria 2 days ago.

## 2024-06-04 NOTE — ED Provider Notes (Signed)
 [facility]   [id number] 06/04/24 Arrival Time: 1732  ASSESSMENT & PLAN:  1. Pelvic cramping   2. Hematuria, unspecified type       Discharge Instructions      You have had labs (vaginal testing for infection and a urine culture) sent today. We will call you with any significant abnormalities or if there is need to begin or change treatment or pursue further follow up.  You may also review your test results online through MyChart. If you do not have a MyChart account, instructions to sign up should be on your discharge paperwork.    Labs Reviewed  POCT URINALYSIS DIP (MANUAL ENTRY) - Abnormal; Notable for the following components:      Result Value   Blood, UA trace-intact (*)    All other components within normal limits  URINE CULTURE  POCT URINE PREGNANCY  CERVICOVAGINAL ANCILLARY ONLY   Will notify of any positive results.   Reviewed expectations re: course of current medical issues. Questions answered. Outlined signs and symptoms indicating need for more acute intervention. Patient verbalized understanding. After Visit Summary given.   SUBJECTIVE:  Cassie Nguyen is a 28 y.o. female who would like to be re-tested for vaginal yeast infection. Denies specific vaginal discharge; with mild pelvic 'cramping feeling' past few days; better today. Ques if she saw blood in urine today.  Patient's last menstrual period was 05/20/2024 (exact date).   OBJECTIVE:  Vitals:   06/04/24 1748  BP: 122/87  Pulse: 71  Resp: 16  Temp: 98.2 F (36.8 C)  TempSrc: Oral  SpO2: 98%     General appearance: alert, cooperative, appears stated age and no distress GU: deferred Skin: warm and dry Psychological: alert and cooperative; normal mood and affect.  Results for orders placed or performed during the hospital encounter of 06/04/24  POC urinalysis dipstick   Collection Time: 06/04/24  6:02 PM  Result Value Ref Range   Color, UA yellow yellow   Clarity,  UA clear clear   Glucose, UA negative negative mg/dL   Bilirubin, UA negative negative   Ketones, POC UA negative negative mg/dL   Spec Grav, UA 2.952 8.413 - 1.025   Blood, UA trace-intact (A) negative   pH, UA 7.5 5.0 - 8.0   Protein Ur, POC negative negative mg/dL   Urobilinogen, UA 0.2 0.2 or 1.0 E.U./dL   Nitrite, UA Negative Negative   Leukocytes, UA Negative Negative  POCT urine pregnancy   Collection Time: 06/04/24  6:02 PM  Result Value Ref Range   Preg Test, Ur Negative Negative    Labs Reviewed  POCT URINALYSIS DIP (MANUAL ENTRY) - Abnormal; Notable for the following components:      Result Value   Blood, UA trace-intact (*)    All other components within normal limits  URINE CULTURE  POCT URINE PREGNANCY  CERVICOVAGINAL ANCILLARY ONLY    Allergies  Allergen Reactions   Peanut-Containing Drug Products Anaphylaxis   Shrimp [Shellfish Allergy] Anaphylaxis    Makes throat itchy and dry   Watermelon Flavoring Agent (Non-Screening) Anaphylaxis   Lactase Other (See Comments)    Constipation, gi upset    Past Medical History:  Diagnosis Date   Asthma    Chlamydia    Gonorrhea    Trichomonas infection    Family History  Problem Relation Age of Onset   Cancer Mother    Social History   Socioeconomic History   Marital status: Single    Spouse name: Not  on file   Number of children: Not on file   Years of education: Not on file   Highest education level: Not on file  Occupational History   Not on file  Tobacco Use   Smoking status: Never   Smokeless tobacco: Never  Vaping Use   Vaping status: Never Used  Substance and Sexual Activity   Alcohol use: Yes    Comment: occasionally   Drug use: Yes    Types: Marijuana   Sexual activity: Yes    Birth control/protection: Condom    Comment: occasional condom use  Other Topics Concern   Not on file  Social History Narrative   Not on file   Social Drivers of Health   Financial Resource Strain: Not on  file  Food Insecurity: No Food Insecurity (05/02/2021)   Hunger Vital Sign    Worried About Running Out of Food in the Last Year: Never true    Ran Out of Food in the Last Year: Never true  Transportation Needs: No Transportation Needs (05/02/2021)   PRAPARE - Administrator, Civil Service (Medical): No    Lack of Transportation (Non-Medical): No  Physical Activity: Not on file  Stress: Not on file  Social Connections: Not on file  Intimate Partner Violence: Not on file           Afton Albright, MD 06/04/24 [phone]

## 2024-06-05 LAB — CERVICOVAGINAL ANCILLARY ONLY
Bacterial Vaginitis (gardnerella): NEGATIVE
Candida Glabrata: NEGATIVE
Candida Vaginitis: NEGATIVE
Chlamydia: NEGATIVE
Comment: NEGATIVE
Comment: NEGATIVE
Comment: NEGATIVE
Comment: NEGATIVE
Comment: NEGATIVE
Comment: NORMAL
Neisseria Gonorrhea: NEGATIVE
Trichomonas: NEGATIVE

## 2024-06-05 LAB — URINE CULTURE: Culture: NO GROWTH

## 2024-06-08 ENCOUNTER — Ambulatory Visit (HOSPITAL_COMMUNITY): Payer: Self-pay

## 2024-07-10 ENCOUNTER — Encounter (HOSPITAL_COMMUNITY): Payer: Self-pay

## 2024-07-10 ENCOUNTER — Ambulatory Visit (HOSPITAL_COMMUNITY)
Admission: RE | Admit: 2024-07-10 | Discharge: 2024-07-10 | Disposition: A | Discharge status: Home / Self Care | Payer: Self-pay | Source: Ambulatory Visit | Attending: Emergency Medicine | Admitting: Emergency Medicine

## 2024-07-10 VITALS — BP 107/74 | HR 83 | Temp 98.4°F | Resp 14

## 2024-07-10 DIAGNOSIS — Z113 Encounter for screening for infections with a predominantly sexual mode of transmission: Secondary | ICD-10-CM | POA: Diagnosis not present

## 2024-07-10 NOTE — ED Triage Notes (Signed)
 PT REQUESTING STD TESTING DENIES ANY S/S.

## 2024-07-10 NOTE — Discharge Instructions (Addendum)
 We will call you if anything on your swab returns positive. You can also see these results on MyChart. Please abstain from sexual intercourse until your results return.

## 2024-07-10 NOTE — ED Provider Notes (Signed)
 [facility]    CSN: [id number] Arrival date & time: 07/10/24  1906      History   Chief Complaint Chief Complaint  Patient presents with   SEXUALLY TRANSMITTED DISEASE    HPI Cassie Nguyen is a 28 y.o. female.  Initially presented for ear pain, however denies this and reports she is here for STD testing. She does not have any symptoms. She has no discharge, itching, odor, rash, dysuria, abdominal pain.  She wants to make sure her partner did not give her yeast States her last testing had yeast and wants to make sure it's gone, however she was seen 1 month ago on 6/5 and all was negative.  Past Medical History:  Diagnosis Date   Asthma    Chlamydia    Gonorrhea    Trichomonas infection     Patient Active Problem List   Diagnosis Date Noted   Irregular periods/menstrual cycles 03/17/2023   Acute otalgia, right 03/17/2023   Possible exposure to STD 03/17/2023   Marijuana use 03/17/2023   Elevated liver enzymes 02/08/2023   Right upper quadrant abdominal pain 11/09/2022   High risk heterosexual behavior 11/09/2022   Abnormal vaginal bleeding 11/09/2022   ASC-H Pap smear 05/02/2021   Trichomoniasis 05/02/2021   Cervical dysplasia 07/27/2019   Abnormal uterine bleeding (AUB) 07/20/2019   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 Authorizing Provider  albuterol  (VENTOLIN  HFA) 108 (90 Base) MCG/ACT inhaler Inhale 2 puffs into the lungs every 4 (four) hours as needed for wheezing or shortness of breath. 09/18/23   Arloa Suzen RAMAN, NP  medroxyPROGESTERone  (PROVERA ) 10 MG  tablet Take 1 tablet (10 mg total) by mouth daily. Patient not taking: Reported on 07/20/2019 06/25/19 02/26/20  Blaise Aleene KIDD, MD    Family History Family History  Problem Relation Age of Onset   Cancer Mother     Social History Social History   Tobacco Use   Smoking status: Never   Smokeless tobacco: Never  Vaping Use   Vaping status: Never Used  Substance Use Topics   Alcohol use: Yes    Comment: occasionally   Drug use: Yes    Types: Marijuana     Allergies   Peanut-containing drug products, Shrimp [shellfish allergy], Watermelon flavoring agent (non-screening), and Lactase   Review of Systems Review of Systems As per HPI  Physical Exam Triage Vital Signs ED Triage Vitals  Encounter Vitals Group     BP 07/10/24 1922 107/74     Girls Systolic BP Percentile --      Girls Diastolic BP Percentile --      Boys Systolic BP Percentile --      Boys Diastolic BP Percentile --      Pulse Rate 07/10/24 1922 83     Resp 07/10/24 1922 14  Temp 07/10/24 1922 98.4 F (36.9 C)     Temp Source 07/10/24 1922 Oral     SpO2 07/10/24 1922 98 %     Weight --      Height --      Head Circumference --      Peak Flow --      Pain Score 07/10/24 1921 0     Pain Loc --      Pain Education --      Exclude from Growth Chart --    No data found.  Updated Vital Signs BP 107/74 (BP Location: Left Arm)   Pulse 83   Temp 98.4 F (36.9 C) (Oral)   Resp 14   LMP 06/17/2024   SpO2 98%   Physical Exam Vitals and nursing note reviewed.  Constitutional:      General: She is not in acute distress.    Appearance: Normal appearance.  HENT:     Mouth/Throat:     Pharynx: Oropharynx is clear.  Cardiovascular:     Rate and Rhythm: Normal rate and regular rhythm.     Pulses: Normal pulses.  Pulmonary:     Effort: Pulmonary effort is normal.  Neurological:     Mental Status: She is alert and oriented to person, place, and time.     UC Treatments / Results  Labs (all  labs ordered are listed, but only abnormal results are displayed) Labs Reviewed  CERVICOVAGINAL ANCILLARY ONLY    EKG  Radiology No results found.  Procedures Procedures   Medications Ordered in UC Medications - No data to display  Initial Impression / Assessment and Plan / UC Course  I have reviewed the triage vital signs and the nursing notes.  Pertinent labs & imaging results that were available during my care of the patient were reviewed by me and considered in my medical decision making (see chart for details).  STD testing requested again by patient today She has no symptoms She wants to be tested for everything. I have strongly recommended HIV and RPR testing today, she's not had these in a year and had new sexual partners since then. She declines this testing saying they're not sexually transmitted. Attempted patient education. She is asking for BV and yeast testing to make sure she didn't get that from her partner. Discussed those are not sexually transmitted, but I will be testing for gonorrhea, chlamydia, and trichomonas. Also discussed there is no treatment needed for asymptomatic BV or yeast, and she has confirmed with me she does not have any symptoms at this time. Patient reports she needs to leave to go to work and declines paperwork. She leaves the clinic room mid discussion   Final Clinical Impressions(s) / UC Diagnoses   Final diagnoses:  Screen for STD (sexually transmitted disease)     Discharge Instructions      We will call you if anything on your swab returns positive. You can also see these results on MyChart. Please abstain from sexual intercourse until your results return.     ED Prescriptions   None    PDMP not reviewed this encounter.   Jeryl Asberry RIGGERS 07/10/24 1956

## 2024-07-13 LAB — CERVICOVAGINAL ANCILLARY ONLY
Chlamydia: NEGATIVE
Comment: NEGATIVE
Comment: NEGATIVE
Comment: NORMAL
Neisseria Gonorrhea: NEGATIVE
Trichomonas: NEGATIVE

## 2024-07-16 ENCOUNTER — Telehealth (HOSPITAL_COMMUNITY): Payer: Self-pay

## 2024-07-16 NOTE — Telephone Encounter (Signed)
 Informed by front desk staff: pt called regarding urine results pt contact [phone].   Patient informed of negative results. Also informed that since notes stated no current symptoms urine testing as well as BV or yeast testing was not done.   Patient verbalized understanding.

## 2024-07-18 ENCOUNTER — Ambulatory Visit (HOSPITAL_COMMUNITY): Payer: Self-pay

## 2024-07-19 ENCOUNTER — Ambulatory Visit (HOSPITAL_COMMUNITY): Payer: Self-pay

## 2024-10-06 ENCOUNTER — Encounter (HOSPITAL_COMMUNITY): Payer: Self-pay

## 2024-10-06 ENCOUNTER — Ambulatory Visit (HOSPITAL_COMMUNITY)
Admission: RE | Admit: 2024-10-06 | Discharge: 2024-10-06 | Disposition: A | Discharge status: Home / Self Care | Payer: Self-pay | Source: Ambulatory Visit | Attending: Family Medicine | Admitting: Family Medicine

## 2024-10-06 VITALS — BP 117/78 | HR 70 | Temp 97.2°F | Resp 18

## 2024-10-06 DIAGNOSIS — N760 Acute vaginitis: Secondary | ICD-10-CM | POA: Insufficient documentation

## 2024-10-06 DIAGNOSIS — N926 Irregular menstruation, unspecified: Secondary | ICD-10-CM | POA: Diagnosis not present

## 2024-10-06 LAB — POCT URINE PREGNANCY: Preg Test, Ur: NEGATIVE

## 2024-10-06 MED ORDER — KETOCONAZOLE 2 % EX CREA: 1.0000 | TOPICAL_CREAM | Freq: Every day | CUTANEOUS | 0 refills | Status: DC

## 2024-10-06 MED ORDER — FLUCONAZOLE 150 MG PO TABS
150.0000 mg | ORAL_TABLET | ORAL | 0 refills | Status: DC
Start: 2024-10-06 — End: 2024-10-10

## 2024-10-06 NOTE — Discharge Instructions (Addendum)
 The pregnancy test was negative  Ketoconazole cream--apply once daily to the red area till better.  Take fluconazole  150 mg--1 tablet every 3 days for 2 doses  Staff will notify you if there is anything positive on the swab. It can take 2-3 days for the tests to result, depending on the day of the week your test was taken. You will only be notified if there are any positives on the testing; test results will also go to your MyChart if you are signed up for MyChart.

## 2024-10-06 NOTE — ED Provider Notes (Signed)
 [facility]    CSN: [id number] Arrival date & time: 10/06/24  1909      History   Chief Complaint Chief Complaint  Patient presents with   Vaginal Itching    HPI Cassie Nguyen is a 28 y.o. female.    Vaginal Itching   Here for some rash and irritation and itching of her perineum.  Is been bothering her intermittently for the last month or 2.  She has not really noted any discharge.  She does have occasional pelvic cramping.  No fever or dysuria.   NKDA last menstrual cycle was September 22, but then she started spotting a few days ago. Past Medical History:  Diagnosis Date   Asthma    Chlamydia    Gonorrhea    Trichomonas infection     Patient Active Problem List   Diagnosis Date Noted   Irregular periods/menstrual cycles 03/17/2023   Acute otalgia, right 03/17/2023   Possible exposure to STD 03/17/2023   Marijuana use 03/17/2023   Elevated liver enzymes 02/08/2023   Right upper quadrant abdominal pain 11/09/2022   High risk heterosexual behavior 11/09/2022   Abnormal vaginal bleeding 11/09/2022   ASC-H Pap smear 05/02/2021   Trichomoniasis 05/02/2021   Cervical dysplasia 07/27/2019   Abnormal uterine bleeding (AUB) 07/20/2019   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 Authorizing Provider  fluconazole  (DIFLUCAN ) 150 MG tablet Take 1 tablet (150 mg total) by mouth every 3 (three) days for 2 doses. 10/06/24 10/10/24 Yes Averill Winters K, MD  ketoconazole (NIZORAL) 2 % cream Apply 1 Application topically daily. To affected area on bottom till better 10/06/24   Yes Janey Petron, Sharlet POUR, MD  albuterol  (VENTOLIN  HFA) 108 (90 Base) MCG/ACT inhaler Inhale 2 puffs into the lungs every 4 (four) hours as needed for wheezing or shortness of breath. 09/18/23   Arloa Suzen RAMAN, NP  medroxyPROGESTERone  (PROVERA ) 10 MG tablet Take 1 tablet (10 mg total) by mouth daily. Patient not taking: Reported on 07/20/2019 06/25/19 02/26/20  Blaise Aleene KIDD, MD    Family History Family History  Problem Relation Age of Onset   Cancer Mother     Social History Social History   Tobacco Use   Smoking status: Never   Smokeless tobacco: Never  Vaping Use   Vaping status: Never Used  Substance Use Topics   Alcohol use: Yes    Comment: occasionally   Drug use: Not Currently    Types: Marijuana     Allergies   Peanut-containing drug products, Shrimp [shellfish allergy], Watermelon flavoring agent (non-screening), and Lactase   Review of Systems Review of Systems   Physical Exam Triage Vital Signs ED Triage Vitals  Encounter Vitals Group     BP 10/06/24 1932 117/78     Girls Systolic BP Percentile --      Girls  Diastolic BP Percentile --      Boys Systolic BP Percentile --      Boys Diastolic BP Percentile --      Pulse Rate 10/06/24 1932 70     Resp 10/06/24 1932 18     Temp 10/06/24 1932 (!) 97.2 F (36.2 C)     Temp Source 10/06/24 1932 Oral     SpO2 10/06/24 1932 96 %     Weight --      Height --      Head Circumference --      Peak Flow --      Pain Score 10/06/24 1928 0     Pain Loc --      Pain Education --      Exclude from Growth Chart --    No data found.  Updated Vital Signs BP 117/78 (BP Location: Left Arm)   Pulse 70   Temp (!) 97.2 F (36.2 C) (Oral)   Resp 18   LMP 09/21/2024   SpO2 96%   Visual Acuity Right Eye Distance:   Left Eye Distance:   Bilateral Distance:    Right Eye Near:   Left Eye Near:    Bilateral Near:     Physical Exam Vitals reviewed.  Genitourinary:    Comments: Chaperone is present during  the time of exam.  There is a faint erythematous confluent rash of both sides of her perineum posterior to the labia Skin:    Coloration: Skin is not pale.  Neurological:     Mental Status: She is oriented to person, place, and time.  Psychiatric:        Behavior: Behavior normal.      UC Treatments / Results  Labs (all labs ordered are listed, but only abnormal results are displayed) Labs Reviewed  POCT URINE PREGNANCY - Normal  CERVICOVAGINAL ANCILLARY ONLY    EKG   Radiology No results found.  Procedures Procedures (including critical care time)  Medications Ordered in UC Medications - No data to display  Initial Impression / Assessment and Plan / UC Course  I have reviewed the triage vital signs and the nursing notes.  Pertinent labs & imaging results that were available during my care of the patient were reviewed by me and considered in my medical decision making (see chart for details).     UPT was negative Diflucan  and Nizoral sent in to treat the yeast  rash.  Vaginal self swab is done, and we will notify of any positives on that and treat per protocol. Final Clinical Impressions(s) / UC Diagnoses   Final diagnoses:  Acute vaginitis  Irregular menses     Discharge Instructions      The pregnancy test was negative  Ketoconazole cream--apply once daily to the red area till better.  Take fluconazole  150 mg--1 tablet every 3 days for 2 doses  Staff will notify you if there is anything positive on the swab. It can take 2-3 days for the tests to result, depending on the day of the week your test was taken. You will only be notified if there are any positives on the testing; test results will also go to your MyChart if you are signed up for MyChart.      ED Prescriptions     Medication Sig Dispense Auth. Provider   fluconazole  (DIFLUCAN ) 150 MG tablet Take 1 tablet (150 mg total) by mouth every 3 (three) days for 2 doses. 2 tablet Vonna Sharlet POUR, MD  ketoconazole (NIZORAL) 2 % cream Apply 1 Application topically daily. To affected area on bottom till better 30 g Valeska Haislip, Sharlet POUR, MD      PDMP not reviewed this encounter.   Vonna Sharlet POUR, MD 10/06/24 2052

## 2024-10-06 NOTE — ED Triage Notes (Signed)
 Pt c/o itching tender rash to vaginal area x2wks. Denies vaginal discharge. Requesting testing for yeast. States has been having spotting between her cycles for past 2 months. States celibant x4 months.

## 2024-10-07 ENCOUNTER — Ambulatory Visit (HOSPITAL_COMMUNITY): Payer: Self-pay

## 2024-10-07 LAB — CERVICOVAGINAL ANCILLARY ONLY
Bacterial Vaginitis (gardnerella): NEGATIVE
Candida Glabrata: NEGATIVE
Candida Vaginitis: POSITIVE — AB
Chlamydia: NEGATIVE
Comment: NEGATIVE
Comment: NEGATIVE
Comment: NEGATIVE
Comment: NEGATIVE
Comment: NEGATIVE
Comment: NORMAL
Neisseria Gonorrhea: NEGATIVE
Trichomonas: NEGATIVE

## 2024-10-09 ENCOUNTER — Ambulatory Visit
Admission: EM | Admit: 2024-10-09 | Discharge: 2024-10-09 | Disposition: A | Discharge status: Home / Self Care | Attending: Family Medicine | Admitting: Family Medicine

## 2024-10-09 DIAGNOSIS — L309 Dermatitis, unspecified: Secondary | ICD-10-CM

## 2024-10-09 HISTORY — DX: Age-related osteoporosis without current pathological fracture: M81.0

## 2024-10-09 HISTORY — DX: Anxiety disorder, unspecified: F41.9

## 2024-10-09 HISTORY — DX: Depression, unspecified: F32.A

## 2024-10-09 HISTORY — DX: Coagulation defect, unspecified: D68.9

## 2024-10-09 HISTORY — DX: Allergy, unspecified, initial encounter: T78.40XA

## 2024-10-09 MED ORDER — CLOTRIMAZOLE 1 % VA CREA: 1.0000 | TOPICAL_CREAM | Freq: Every day | VAGINAL | 0 refills | Status: AC

## 2024-10-09 MED ORDER — DESONIDE 0.05 % EX CREA: TOPICAL_CREAM | Freq: Two times a day (BID) | CUTANEOUS | 0 refills | Status: AC

## 2024-10-09 MED ORDER — CEPHALEXIN 500 MG PO CAPS
500.0000 mg | ORAL_CAPSULE | Freq: Two times a day (BID) | ORAL | 0 refills | Status: AC
Start: 2024-10-09 — End: 2024-10-16

## 2024-10-09 NOTE — ED Provider Notes (Signed)
 [name]    CSN: [id number] Arrival date & time: 10/09/24  1357      History   Chief Complaint Chief Complaint  Patient presents with   Skin Problem    HPI Cassie Nguyen is a 28 y.o. female.   HPI Here for itchy, numb, sore spots on hands and left outer ear. Has noted it all for a few days. Rash is not generalized, and is on dorsum of left hand , thumbs, and right middle finger. Also a little spot on left pinna.  States that was worried it was her fluconazole  causing an allergic reaction. But then states also [place].  No fever.  The spots itch some and are sore/painful. Then states the ones on her digits are numb.  NKDA  On October 7 I saw her at another of our facilities and she had testing done for vaginitis.  It was positive for Candida.  At the time of that visit I prescribed her fluconazole  for 2 doses and Nizoral cream for the external rash on her perineum.  She states she is improved some.  I did give her results of her swab today. Past Medical History:  Diagnosis Date   Allergy    Anxiety    Asthma    Chlamydia    Clotting disorder    Depression    Gonorrhea    Osteoporosis    Trichomonas infection     Patient Active Problem List   Diagnosis Date Noted   Irregular periods/menstrual cycles 03/17/2023   Acute otalgia, right 03/17/2023   Possible exposure to STD 03/17/2023   Marijuana use 03/17/2023   Elevated liver enzymes 02/08/2023   Right upper quadrant abdominal pain 11/09/2022   High risk heterosexual behavior 11/09/2022   Abnormal vaginal bleeding 11/09/2022   ASC-H Pap smear 05/02/2021   Trichomoniasis 05/02/2021   Cervical dysplasia 07/27/2019   Abnormal uterine bleeding (AUB) 07/20/2019   Right knee injury 09/04/2016   Patellar subluxation 02/19/2012   Patellar tendon rupture 02/19/2012    Past Surgical History:  Procedure Laterality Date   ROOT CANAL      OB History     Gravida  1   Para   0   Term  0   Preterm  0   AB  0   Living  0      SAB  0   IAB  0   Ectopic  0   Multiple  0   Live Births  0            Home Medications    Prior to Admission medications   Medication Sig Start Date End Date Taking? Authorizing Provider  cephALEXin (KEFLEX) 500 MG capsule Take 1 capsule (500 mg total) by mouth 2 (two) times daily for 7 days. 10/09/24 10/16/24 Yes Nefertari Rebman K, MD  clotrimazole (GYNE-LOTRIMIN) 1 % vaginal cream Place 1 Applicatorful vaginally at bedtime for 5 days. 10/09/24 10/14/24 Yes Dellamae Rosamilia K, MD  desonide (DESOWEN) 0.05 % cream Apply topically 2 (two) times daily. To spots on hands till better, up to 2 weeks 10/09/24  Yes Rashea Hoskie, Sharlet POUR, MD  albuterol  (VENTOLIN  HFA) 108 (90 Base) MCG/ACT inhaler Inhale 2 puffs into the lungs every 4 (four) hours as needed for wheezing or shortness of breath. 09/18/23   Arloa Suzen RAMAN, NP  medroxyPROGESTERone  (PROVERA ) 10 MG tablet Take 1 tablet (10 mg total) by mouth daily. Patient not taking: Reported on 07/20/2019 06/25/19  02/26/20  Lamptey, Aleene KIDD, MD    Family History Family History  Problem Relation Age of Onset   Cancer Mother     Social History Social History   Tobacco Use   Smoking status: Never   Smokeless tobacco: Never  Vaping Use   Vaping status: Never Used  Substance Use Topics   Alcohol use: Yes    Comment: occasionally   Drug use: Not Currently    Types: Marijuana     Allergies   Peanut-containing drug products, Shrimp [shellfish allergy], Watermelon flavoring agent (non-screening), and Lactase   Review of Systems Review of Systems   Physical Exam Triage Vital Signs ED Triage Vitals  Encounter Vitals Group     BP 10/09/24 1430 112/78     Girls Systolic BP Percentile --      Girls Diastolic BP Percentile --      Boys Systolic BP Percentile --      Boys Diastolic BP Percentile --      Pulse Rate 10/09/24 1430 62     Resp 10/09/24 1430 16     Temp  10/09/24 1430 97.9 F (36.6 C)     Temp Source 10/09/24 1430 Oral     SpO2 10/09/24 1430 96 %     Weight 10/09/24 1429 132 lb 0.9 oz (59.9 kg)     Height 10/09/24 1429 5' 3 (1.6 m)     Head Circumference --      Peak Flow --      Pain Score 10/09/24 1428 2     Pain Loc --      Pain Education --      Exclude from Growth Chart --    No data found.  Updated Vital Signs BP 112/78 (BP Location: Left Arm)   Pulse 62   Temp 97.9 F (36.6 C) (Oral)   Resp 16   Ht 5' 3 (1.6 m)   Wt 59.9 kg   LMP 09/21/2024   SpO2 96%   BMI 23.39 kg/m   Visual Acuity Right Eye Distance:   Left Eye Distance:   Bilateral Distance:    Right Eye Near:   Left Eye Near:    Bilateral Near:     Physical Exam Vitals reviewed.  Constitutional:      General: She is not in acute distress.    Appearance: She is not ill-appearing, toxic-appearing or diaphoretic.  Cardiovascular:     Rate and Rhythm: Normal rate and regular rhythm.  Pulmonary:     Effort: Pulmonary effort is normal.     Breath sounds: Normal breath sounds.  Skin:    Coloration: Skin is not pale.     Comments: There is a flat slightly erythematous area about 1 cm in diameter on the dorsal webspace between her left thumb and index finger.  Both thumbs on the dorsum have some slight erythema as does the radial surface of her distal right middle finger.  On her left pinna near the outer helix there is a little bit of erythema, but no mass or fluctuance.  Neurological:     Mental Status: She is alert and oriented to person, place, and time.  Psychiatric:        Behavior: Behavior normal.      UC Treatments / Results  Labs (all labs ordered are listed, but only abnormal results are displayed) Labs Reviewed - No data to display  EKG   Radiology No results found.  Procedures Procedures (including critical care time)  Medications Ordered in UC Medications - No data to display  Initial Impression / Assessment and Plan / UC  Course  I have reviewed the triage vital signs and the nursing notes.  Pertinent labs & imaging results that were available during my care of the patient were reviewed by me and considered in my medical decision making (see chart for details).     I do not think this is a reaction to medication  It may be a contact dermatitis, but with it being so sore I have sent in Keflex for possible cellulitis.  Desonide cream is sent into apply to the itchy places for up to 2 weeks.  She wanted me to send in medication for possible renewed yeast infection and she wanted to try cream.  I have sent in Gyne-Lotrimin, but I am not confident that her Medicaid will cover it. Final Clinical Impressions(s) / UC Diagnoses   Final diagnoses:  Dermatitis     Discharge Instructions      Cephalexin 500 mg --1 tablet by mouth 2 times daily for 7 days.  Desonide cream applied 2 times daily to the rash spots on your hands and to your ear for up to 2 weeks  Clotrimazole vaginal cream-1 applicatorful at bedtime for 5 days.  This is to treat yeast infection if it develops. It may or may not be covered by your [facility]    ED Prescriptions     Medication Sig Dispense Auth. Provider   desonide (DESOWEN) 0.05 % cream Apply topically 2 (two) times daily. To spots on hands till better, up to 2 weeks 30 g Yuliana Vandrunen K, MD   cephALEXin (KEFLEX) 500 MG capsule Take 1 capsule (500 mg total) by mouth 2 (two) times daily for 7 days. 14 capsule Ayde Record K, MD   clotrimazole (GYNE-LOTRIMIN) 1 % vaginal cream Place 1 Applicatorful vaginally at bedtime for 5 days. 45 g Vonna Sharlet POUR, MD      PDMP not reviewed this encounter.   Vonna Sharlet POUR, MD 10/09/24 [phone]

## 2024-10-09 NOTE — ED Triage Notes (Signed)
 Attempted to call patient to room for Triage (No Answer from lobby, not present) #1.  No Answer when called at contact number. #2  To retry again shortly.

## 2024-10-09 NOTE — ED Triage Notes (Signed)
 The patient reports experiencing pain, itchiness, and swelling in the right thumb, which is in the same area as symptoms observed in other regions, including the ear. They mention [place] the [place] yesterday, but there is no known history of injury.

## 2024-10-09 NOTE — Discharge Instructions (Signed)
 Cephalexin 500 mg --1 tablet by mouth 2 times daily for 7 days.  Desonide cream applied 2 times daily to the rash spots on your hands and to your ear for up to 2 weeks  Clotrimazole vaginal cream-1 applicatorful at bedtime for 5 days.  This is to treat yeast infection if it develops. It may or may not be covered by your Medicaid

## 2024-10-19 ENCOUNTER — Ambulatory Visit: Admission: EM | Admit: 2024-10-19 | Discharge: 2024-10-19 | Disposition: A | Discharge status: Home / Self Care

## 2024-10-19 DIAGNOSIS — K5900 Constipation, unspecified: Secondary | ICD-10-CM | POA: Diagnosis not present

## 2024-10-19 DIAGNOSIS — R102 Pelvic and perineal pain unspecified side: Secondary | ICD-10-CM | POA: Diagnosis not present

## 2024-10-19 LAB — POCT URINE DIPSTICK
Bilirubin, UA: NEGATIVE
Glucose, UA: NEGATIVE mg/dL
Ketones, POC UA: NEGATIVE mg/dL
Leukocytes, UA: NEGATIVE
Nitrite, UA: NEGATIVE
Spec Grav, UA: >=1.03 — AB (ref 1.010–1.025)
Urobilinogen, UA: 0.2 U/dL
pH, UA: 5.5 (ref 5.0–8.0)

## 2024-10-19 LAB — POCT URINE PREGNANCY: Preg Test, Ur: NEGATIVE

## 2024-10-19 MED ORDER — POLYETHYLENE GLYCOL 3350 17 GM/SCOOP PO POWD: 17.0000 g | Freq: Every day | ORAL | 0 refills | Status: AC

## 2024-10-19 MED ORDER — DICLOFENAC SODIUM 50 MG PO TBEC: 50.0000 mg | DELAYED_RELEASE_TABLET | Freq: Two times a day (BID) | ORAL | 1 refills | Status: AC

## 2024-10-19 NOTE — Discharge Instructions (Signed)
  1. Vaginal pain (Primary) - POCT URINE DIPSTICK completed in UC shows trace intact blood, no nitrite, no leukocytes, these findings are not indicative of urinary tract infection. - POCT urine pregnancy complete in UC is negative - diclofenac (VOLTAREN) 50 MG EC tablet; Take 1 tablet (50 mg total) by mouth 2 (two) times daily.  Dispense: 30 tablet; Refill: 1 - Urine Culture collected in UC and sent to lab for further testing results will be available in 2 to 3 days.  2. Constipation, unspecified constipation type - polyethylene glycol powder (MIRALAX) 17 GM/SCOOP powder; Take 17 g by mouth daily. Mixed with 6 to 8 ounces of water or juice.  Dispense: 238 g; Refill: 0  -Continue to monitor symptoms for any change in severity if there is any escalation of current symptoms or development of new symptoms follow-up in ER for further evaluation and management.

## 2024-10-19 NOTE — ED Triage Notes (Addendum)
 Patient here reporting during/after sex abd pain. This lasted about 30 mins with some bladder fullness, urgency. When setting down to pee hard to stand due to this pain. The pain now is better but can still feel it. Some current vaginal discharge (today). Recent yeast infection, took medication. In the past has had a ovarian cyst. Last bowel movement 4 days ago.

## 2024-10-19 NOTE — ED Provider Notes (Signed)
 [facility]  Note:  This document was prepared using Conservation officer, historic buildings and may include unintentional dictation errors.  MRN: [id number] DOB: 07-Dec-1996  Subjective:   Cassie Nguyen is a 28 y.o. female presenting for evaluation after having severe suprapubic abdominal pain/vaginal pain after intercourse.  Patient reports sharp pain in her abdomen that lasted about 30 minutes, bladder fullness and urgency to urinate, no burning with urination today but did have some mild dysuria last night after incident.  Patient reports some mild vaginal discharge has a previous history of yeast infection has also had history of ovarian cysts.  Patient reports she is mildly constipated last bowel movement was 4 days ago.  No fever, shortness of breath, chest pain, weakness, dizziness.  No current facility-administered medications for this encounter.  Current Outpatient Medications:    diclofenac (VOLTAREN) 50 MG EC tablet, Take 1 tablet (50 mg total) by mouth 2 (two) times daily., Disp: 30 tablet, Rfl: 1   polyethylene glycol powder (MIRALAX) 17 GM/SCOOP powder, Take 17 g by mouth daily. Mixed with 6 to 8 ounces of water or juice., Disp: 238 g, Rfl: 0   albuterol  (VENTOLIN  HFA) 108 (90 Base) MCG/ACT inhaler, Inhale 2 puffs into the lungs every 4 (four) hours as needed for wheezing or shortness of breath., Disp: , Rfl:    desonide (DESOWEN) 0.05 % cream, Apply topically 2 (two) times daily. To spots on hands till better, up to 2 weeks, Disp: 30 g, Rfl: 0   Allergies  Allergen Reactions   Peanut-Containing Drug Products Anaphylaxis   Shrimp [Shellfish Allergy] Anaphylaxis    Makes throat itchy and dry   Watermelon Flavoring Agent (Non-Screening) Anaphylaxis   Lactase Other (See Comments)    Constipation, gi upset    Past Medical History:  Diagnosis Date   Allergy    Anxiety    Asthma    Chlamydia    Clotting disorder    Depression    Gonorrhea    Osteoporosis     Trichomonas infection      Past Surgical History:  Procedure Laterality Date   ROOT CANAL      Family History  Problem Relation Age of Onset   Cancer Mother     Social History   Tobacco Use   Smoking status: Never   Smokeless tobacco: Never  Vaping Use   Vaping status: Never Used  Substance Use Topics   Alcohol use: Yes    Comment: occasionally   Drug use: Yes    Types: Marijuana    ROS Refer to HPI for ROS details.  Objective:   Vitals: BP 121/70 (BP Location: Left Arm)   Pulse (!) 104   Temp 97.7 F (36.5 C) (Oral)   Resp 18   Ht 5' 3 (1.6 m)   Wt 130 lb (59 kg)   LMP 09/21/2024 (Exact Date)   SpO2 97%   BMI 23.03 kg/m   Physical Exam Vitals and nursing note reviewed.  Constitutional:      General: She is not in acute distress.    Appearance: She is well-developed. She is not ill-appearing or toxic-appearing.  HENT:     Head: Normocephalic and atraumatic.     Mouth/Throat:     Mouth: Mucous membranes are moist.  Cardiovascular:     Rate and Rhythm: Normal rate.  Pulmonary:     Effort: Pulmonary effort is normal. No respiratory distress.  Abdominal:     General: There is no distension.  Palpations: Abdomen is soft.     Tenderness: There is abdominal tenderness in the suprapubic area. There is no right CVA tenderness, left CVA tenderness, guarding or rebound.  Genitourinary:    Vagina: Vaginal discharge and bleeding present.  Musculoskeletal:        General: Normal range of motion.  Skin:    General: Skin is warm and dry.  Neurological:     General: No focal deficit present.     Mental Status: She is alert and oriented to person, place, and time.  Psychiatric:        Mood and Affect: Mood normal.        Behavior: Behavior normal.     Procedures  Results for orders placed or performed during the hospital encounter of 10/19/24 (from the past 24 hours)  POCT URINE DIPSTICK     Status: Abnormal   Collection Time: 10/19/24  4:54 PM   Result Value Ref Range   Color, UA other (A) yellow   Clarity, UA cloudy (A) clear   Glucose, UA negative negative mg/dL   Bilirubin, UA negative negative   Ketones, POC UA negative negative mg/dL   Spec Grav, UA >=8.969 (A) 1.010 - 1.025   Blood, UA trace-intact (A) negative   pH, UA 5.5 5.0 - 8.0   POC PROTEIN,UA trace negative, trace   Urobilinogen, UA 0.2 0.2 or 1.0 E.U./dL   Nitrite, UA Negative Negative   Leukocytes, UA Negative Negative  POCT urine pregnancy     Status: Normal   Collection Time: 10/19/24  5:01 PM  Result Value Ref Range   Preg Test, Ur Negative Negative    No results found.   Assessment and Plan :     Discharge Instructions       1. Vaginal pain (Primary) - POCT URINE DIPSTICK completed in UC shows trace intact blood, no nitrite, no leukocytes, these findings are not indicative of urinary tract infection. - POCT urine pregnancy complete in UC is negative - diclofenac (VOLTAREN) 50 MG EC tablet; Take 1 tablet (50 mg total) by mouth 2 (two) times daily.  Dispense: 30 tablet; Refill: 1 - Urine Culture collected in UC and sent to lab for further testing results will be available in 2 to 3 days.  2. Constipation, unspecified constipation type - polyethylene glycol powder (MIRALAX) 17 GM/SCOOP powder; Take 17 g by mouth daily. Mixed with 6 to 8 ounces of water or juice.  Dispense: 238 g; Refill: 0  -Continue to monitor symptoms for any change in severity if there is any escalation of current symptoms or development of new symptoms follow-up in ER for further evaluation and management.       Davonne Jarnigan B Cameren Earnest   [place] Lobo, [place], [place] 10/19/24 [phone]

## 2024-10-19 NOTE — ED Notes (Signed)
 Called to room x2, phone call, lobby

## 2024-10-20 LAB — CERVICOVAGINAL ANCILLARY ONLY
Bacterial Vaginitis (gardnerella): NEGATIVE
Candida Glabrata: NEGATIVE
Candida Vaginitis: NEGATIVE
Chlamydia: NEGATIVE
Comment: NEGATIVE
Comment: NEGATIVE
Comment: NEGATIVE
Comment: NEGATIVE
Comment: NEGATIVE
Comment: NORMAL
Neisseria Gonorrhea: NEGATIVE
Trichomonas: NEGATIVE

## 2024-10-21 ENCOUNTER — Ambulatory Visit (HOSPITAL_COMMUNITY): Payer: Self-pay

## 2024-10-21 LAB — URINE CULTURE
Culture: 30000 — AB
Special Requests: NORMAL

## 2024-12-17 ENCOUNTER — Ambulatory Visit

## 2024-12-21 ENCOUNTER — Ambulatory Visit
Admission: RE | Admit: 2024-12-21 | Discharge: 2024-12-21 | Disposition: A | Discharge status: Home / Self Care | Attending: Student | Admitting: Student

## 2024-12-21 VITALS — BP 108/70 | HR 83 | Temp 97.3°F | Resp 18

## 2024-12-21 DIAGNOSIS — M549 Dorsalgia, unspecified: Secondary | ICD-10-CM | POA: Insufficient documentation

## 2024-12-21 DIAGNOSIS — M542 Cervicalgia: Secondary | ICD-10-CM | POA: Diagnosis present

## 2024-12-21 DIAGNOSIS — Z113 Encounter for screening for infections with a predominantly sexual mode of transmission: Secondary | ICD-10-CM | POA: Insufficient documentation

## 2024-12-21 DIAGNOSIS — S46819A Strain of other muscles, fascia and tendons at shoulder and upper arm level, unspecified arm, initial encounter: Secondary | ICD-10-CM | POA: Diagnosis not present

## 2024-12-21 LAB — POCT URINE DIPSTICK
Bilirubin, UA: NEGATIVE
Glucose, UA: NEGATIVE mg/dL
Ketones, POC UA: NEGATIVE mg/dL
Leukocytes, UA: NEGATIVE
Nitrite, UA: NEGATIVE
POC PROTEIN,UA: NEGATIVE
Spec Grav, UA: 1.015
Urobilinogen, UA: 0.2 U/dL
pH, UA: 6

## 2024-12-21 LAB — POCT URINE PREGNANCY: Preg Test, Ur: NEGATIVE

## 2024-12-21 MED ORDER — BACLOFEN 10 MG PO TABS: 10.0000 mg | ORAL_TABLET | Freq: Three times a day (TID) | ORAL | 0 refills | Status: AC
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
## 2024-12-21 NOTE — Discharge Instructions (Addendum)
-  Your urine is normal. You do not have a UTI. -You can take Tylenol  up to 1000 mg 3 times daily, and ibuprofen  up to 600 mg 3 times daily with food.  You can take these together, or alternate every 3-4 hours. -You can take the muscle relaxer Baclofen  up to 3 times daily for muscle spasms and pain.  This medication will make you drowsy, so take it when you are home and do not need to drive. - We are testing for gonorrhea, chlamydia, trichomonas, BV, and yeast. -We will call with any positive results in about 3 business days and can send treatment if necessary. -We will not call with negative lab results. -Lab results will automatically go to your MyChart.

## 2024-12-21 NOTE — ED Triage Notes (Signed)
 Provider in to assess pt prior to RN

## 2024-12-22 LAB — CERVICOVAGINAL ANCILLARY ONLY
Bacterial Vaginitis (gardnerella): NEGATIVE
Candida Glabrata: NEGATIVE
Candida Vaginitis: NEGATIVE
Chlamydia: NEGATIVE
Comment: NEGATIVE
Comment: NEGATIVE
Comment: NEGATIVE
Comment: NEGATIVE
Comment: NEGATIVE
Comment: NORMAL
Neisseria Gonorrhea: NEGATIVE
Trichomonas: NEGATIVE

## 2024-12-22 LAB — URINE CULTURE: Culture: NO GROWTH

## 2024-12-23 ENCOUNTER — Ambulatory Visit (HOSPITAL_COMMUNITY): Payer: Self-pay
